# Patient Record
Sex: Male | Born: 1935 | Race: White | Hispanic: No | Marital: Married | Smoking: Former smoker
Health system: Southern US, Community
[De-identification: ages and names within clinical notes are randomized; demographics above are authoritative.]

## PROBLEM LIST (undated history)

## (undated) DIAGNOSIS — R7301 Impaired fasting glucose: Secondary | ICD-10-CM

## (undated) DIAGNOSIS — F329 Major depressive disorder, single episode, unspecified: Secondary | ICD-10-CM

## (undated) DIAGNOSIS — I4891 Unspecified atrial fibrillation: Secondary | ICD-10-CM

## (undated) DIAGNOSIS — K5790 Diverticulosis of intestine, part unspecified, without perforation or abscess without bleeding: Secondary | ICD-10-CM

## (undated) DIAGNOSIS — H353290 Exudative age-related macular degeneration, unspecified eye, stage unspecified: Secondary | ICD-10-CM

## (undated) DIAGNOSIS — G4733 Obstructive sleep apnea (adult) (pediatric): Secondary | ICD-10-CM

## (undated) DIAGNOSIS — E785 Hyperlipidemia, unspecified: Secondary | ICD-10-CM

## (undated) DIAGNOSIS — J984 Other disorders of lung: Secondary | ICD-10-CM

## (undated) DIAGNOSIS — G4731 Primary central sleep apnea: Secondary | ICD-10-CM

## (undated) DIAGNOSIS — C4359 Malignant melanoma of other part of trunk: Secondary | ICD-10-CM

## (undated) DIAGNOSIS — M545 Low back pain: Secondary | ICD-10-CM

## (undated) DIAGNOSIS — D638 Anemia in other chronic diseases classified elsewhere: Secondary | ICD-10-CM

## (undated) DIAGNOSIS — F17200 Nicotine dependence, unspecified, uncomplicated: Secondary | ICD-10-CM

## (undated) DIAGNOSIS — M5106 Intervertebral disc disorders with myelopathy, lumbar region: Secondary | ICD-10-CM

## (undated) DIAGNOSIS — I951 Orthostatic hypotension: Secondary | ICD-10-CM

## (undated) DIAGNOSIS — M12559 Traumatic arthropathy, unspecified hip: Secondary | ICD-10-CM

## (undated) DIAGNOSIS — M109 Gout, unspecified: Secondary | ICD-10-CM

## (undated) HISTORY — DX: Low back pain: M54.5

## (undated) HISTORY — DX: Intervertebral disc disorders with myelopathy, lumbar region: M51.06

## (undated) HISTORY — DX: Obstructive sleep apnea (adult) (pediatric): G47.33

## (undated) HISTORY — DX: Orthostatic hypotension: I95.1

## (undated) HISTORY — DX: Traumatic arthropathy, unspecified hip: M12.559

## (undated) HISTORY — PX: LUMBAR FUSION: SHX111

## (undated) HISTORY — DX: Other disorders of lung: J98.4

## (undated) HISTORY — DX: Gout, unspecified: M10.9

## (undated) HISTORY — DX: Impaired fasting glucose: R73.01

## (undated) HISTORY — DX: Malignant melanoma of other part of trunk: C43.59

## (undated) HISTORY — PX: CATARACT EXTRACTION: SUR2

## (undated) HISTORY — DX: Nicotine dependence, unspecified, uncomplicated: F17.200

## (undated) HISTORY — PX: MELANOMA EXCISION: SHX5266

## (undated) HISTORY — DX: Major depressive disorder, single episode, unspecified: F32.9

## (undated) HISTORY — DX: Anemia in other chronic diseases classified elsewhere: D63.8

## (undated) HISTORY — DX: Exudative age-related macular degeneration, unspecified eye, stage unspecified: H35.3290

## (undated) HISTORY — PX: LUMBAR LAMINECTOMY: SHX95

## (undated) HISTORY — DX: Primary central sleep apnea: G47.31

## (undated) HISTORY — DX: Hyperlipidemia, unspecified: E78.5

## (undated) HISTORY — DX: Unspecified atrial fibrillation: I48.91

## (undated) HISTORY — DX: Diverticulosis of intestine, part unspecified, without perforation or abscess without bleeding: K57.90

## (undated) HISTORY — PX: TONSILECTOMY, ADENOIDECTOMY, BILATERAL MYRINGOTOMY AND TUBES: SHX2538

---

## 1997-11-13 ENCOUNTER — Ambulatory Visit: Admission: RE | Admit: 1997-11-13 | Discharge: 1997-11-13 | Payer: Self-pay | Admitting: Otolaryngology

## 1998-06-19 ENCOUNTER — Ambulatory Visit: Admission: RE | Admit: 1998-06-19 | Discharge: 1998-06-19 | Payer: Self-pay | Admitting: Otolaryngology

## 1999-07-06 ENCOUNTER — Encounter: Payer: Self-pay | Admitting: Emergency Medicine

## 1999-07-06 ENCOUNTER — Inpatient Hospital Stay (HOSPITAL_COMMUNITY): Admission: EM | Admit: 1999-07-06 | Discharge: 1999-07-07 | Payer: Self-pay | Admitting: Emergency Medicine

## 1999-07-11 ENCOUNTER — Ambulatory Visit (HOSPITAL_COMMUNITY): Admission: RE | Admit: 1999-07-11 | Discharge: 1999-07-11 | Payer: Self-pay | Admitting: Internal Medicine

## 1999-07-11 ENCOUNTER — Encounter: Payer: Self-pay | Admitting: Internal Medicine

## 1999-08-22 ENCOUNTER — Ambulatory Visit (HOSPITAL_COMMUNITY): Admission: RE | Admit: 1999-08-22 | Discharge: 1999-08-22 | Payer: Self-pay | Admitting: Orthopaedic Surgery

## 1999-09-05 ENCOUNTER — Ambulatory Visit (HOSPITAL_COMMUNITY): Admission: RE | Admit: 1999-09-05 | Discharge: 1999-09-05 | Payer: Self-pay | Admitting: Orthopaedic Surgery

## 1999-09-21 ENCOUNTER — Ambulatory Visit (HOSPITAL_COMMUNITY): Admission: RE | Admit: 1999-09-21 | Discharge: 1999-09-21 | Payer: Self-pay | Admitting: Orthopaedic Surgery

## 1999-11-27 ENCOUNTER — Encounter: Admission: RE | Admit: 1999-11-27 | Discharge: 2000-02-25 | Payer: Self-pay | Admitting: Internal Medicine

## 2000-04-18 ENCOUNTER — Inpatient Hospital Stay (HOSPITAL_COMMUNITY): Admission: RE | Admit: 2000-04-18 | Discharge: 2000-04-19 | Payer: Self-pay | Admitting: Orthopaedic Surgery

## 2000-08-11 ENCOUNTER — Ambulatory Visit (HOSPITAL_BASED_OUTPATIENT_CLINIC_OR_DEPARTMENT_OTHER): Admission: RE | Admit: 2000-08-11 | Discharge: 2000-08-11 | Payer: Self-pay | Admitting: Internal Medicine

## 2003-09-17 ENCOUNTER — Emergency Department (HOSPITAL_COMMUNITY): Admission: EM | Admit: 2003-09-17 | Discharge: 2003-09-17 | Payer: Self-pay | Admitting: Emergency Medicine

## 2003-11-21 ENCOUNTER — Ambulatory Visit: Payer: Self-pay | Admitting: Internal Medicine

## 2003-12-30 ENCOUNTER — Ambulatory Visit: Payer: Self-pay | Admitting: Internal Medicine

## 2004-02-24 ENCOUNTER — Ambulatory Visit: Payer: Self-pay | Admitting: Gastroenterology

## 2004-02-29 ENCOUNTER — Ambulatory Visit: Payer: Self-pay | Admitting: Internal Medicine

## 2004-03-07 ENCOUNTER — Ambulatory Visit: Payer: Self-pay | Admitting: Gastroenterology

## 2004-03-13 ENCOUNTER — Ambulatory Visit: Payer: Self-pay | Admitting: Internal Medicine

## 2004-04-10 ENCOUNTER — Ambulatory Visit: Payer: Self-pay | Admitting: Internal Medicine

## 2004-04-28 ENCOUNTER — Emergency Department (HOSPITAL_COMMUNITY): Admission: EM | Admit: 2004-04-28 | Discharge: 2004-04-28 | Payer: Self-pay | Admitting: Emergency Medicine

## 2004-05-04 ENCOUNTER — Ambulatory Visit: Payer: Self-pay | Admitting: Internal Medicine

## 2004-06-05 ENCOUNTER — Ambulatory Visit: Payer: Self-pay | Admitting: Internal Medicine

## 2004-07-30 ENCOUNTER — Ambulatory Visit (HOSPITAL_BASED_OUTPATIENT_CLINIC_OR_DEPARTMENT_OTHER): Admission: RE | Admit: 2004-07-30 | Discharge: 2004-07-30 | Payer: Self-pay | Admitting: Surgery

## 2004-07-30 ENCOUNTER — Encounter (INDEPENDENT_AMBULATORY_CARE_PROVIDER_SITE_OTHER): Payer: Self-pay | Admitting: Specialist

## 2004-07-30 ENCOUNTER — Ambulatory Visit (HOSPITAL_COMMUNITY): Admission: RE | Admit: 2004-07-30 | Discharge: 2004-07-30 | Payer: Self-pay | Admitting: Surgery

## 2004-07-31 ENCOUNTER — Ambulatory Visit: Payer: Self-pay | Admitting: Internal Medicine

## 2004-10-01 ENCOUNTER — Ambulatory Visit: Payer: Self-pay | Admitting: Internal Medicine

## 2004-10-31 ENCOUNTER — Ambulatory Visit: Payer: Self-pay | Admitting: Internal Medicine

## 2004-11-14 ENCOUNTER — Ambulatory Visit: Payer: Self-pay | Admitting: Internal Medicine

## 2005-02-19 ENCOUNTER — Ambulatory Visit: Payer: Self-pay | Admitting: Internal Medicine

## 2005-03-11 ENCOUNTER — Ambulatory Visit: Payer: Self-pay | Admitting: Internal Medicine

## 2005-03-24 ENCOUNTER — Encounter: Admission: RE | Admit: 2005-03-24 | Discharge: 2005-03-24 | Payer: Self-pay | Admitting: Internal Medicine

## 2005-03-28 ENCOUNTER — Ambulatory Visit: Payer: Self-pay | Admitting: Internal Medicine

## 2005-07-23 ENCOUNTER — Ambulatory Visit: Payer: Self-pay | Admitting: Internal Medicine

## 2005-09-24 ENCOUNTER — Ambulatory Visit: Payer: Self-pay | Admitting: Internal Medicine

## 2005-09-29 ENCOUNTER — Encounter: Admission: RE | Admit: 2005-09-29 | Discharge: 2005-09-29 | Payer: Self-pay | Admitting: Neurosurgery

## 2005-10-07 ENCOUNTER — Ambulatory Visit: Payer: Self-pay | Admitting: Internal Medicine

## 2005-11-05 ENCOUNTER — Ambulatory Visit: Payer: Self-pay | Admitting: Internal Medicine

## 2005-12-18 ENCOUNTER — Encounter: Admission: RE | Admit: 2005-12-18 | Discharge: 2005-12-18 | Payer: Self-pay | Admitting: Neurosurgery

## 2005-12-22 ENCOUNTER — Encounter: Admission: RE | Admit: 2005-12-22 | Discharge: 2005-12-22 | Payer: Self-pay | Admitting: Neurosurgery

## 2006-01-14 DIAGNOSIS — D638 Anemia in other chronic diseases classified elsewhere: Secondary | ICD-10-CM

## 2006-01-14 HISTORY — DX: Anemia in other chronic diseases classified elsewhere: D63.8

## 2006-02-04 ENCOUNTER — Ambulatory Visit: Payer: Self-pay | Admitting: Internal Medicine

## 2006-02-18 ENCOUNTER — Ambulatory Visit: Payer: Self-pay | Admitting: Internal Medicine

## 2006-03-04 ENCOUNTER — Ambulatory Visit: Payer: Self-pay | Admitting: Internal Medicine

## 2006-03-27 ENCOUNTER — Ambulatory Visit: Payer: Self-pay | Admitting: Internal Medicine

## 2006-05-07 ENCOUNTER — Ambulatory Visit: Payer: Self-pay | Admitting: Internal Medicine

## 2006-07-14 ENCOUNTER — Ambulatory Visit: Payer: Self-pay | Admitting: Internal Medicine

## 2006-08-19 ENCOUNTER — Telehealth: Payer: Self-pay | Admitting: Internal Medicine

## 2006-08-19 DIAGNOSIS — M5106 Intervertebral disc disorders with myelopathy, lumbar region: Secondary | ICD-10-CM

## 2006-08-19 DIAGNOSIS — G4731 Primary central sleep apnea: Secondary | ICD-10-CM | POA: Insufficient documentation

## 2006-08-19 DIAGNOSIS — C4359 Malignant melanoma of other part of trunk: Secondary | ICD-10-CM

## 2006-08-19 DIAGNOSIS — G4733 Obstructive sleep apnea (adult) (pediatric): Secondary | ICD-10-CM

## 2006-08-19 DIAGNOSIS — D649 Anemia, unspecified: Secondary | ICD-10-CM

## 2006-08-19 DIAGNOSIS — G4739 Other sleep apnea: Secondary | ICD-10-CM

## 2006-08-19 HISTORY — DX: Malignant melanoma of other part of trunk: C43.59

## 2006-08-19 HISTORY — DX: Intervertebral disc disorders with myelopathy, lumbar region: M51.06

## 2006-08-19 HISTORY — DX: Other sleep apnea: G47.39

## 2006-08-19 HISTORY — DX: Obstructive sleep apnea (adult) (pediatric): G47.33

## 2006-08-19 HISTORY — DX: Primary central sleep apnea: G47.31

## 2006-08-27 ENCOUNTER — Ambulatory Visit: Payer: Self-pay | Admitting: Internal Medicine

## 2006-08-27 DIAGNOSIS — F329 Major depressive disorder, single episode, unspecified: Secondary | ICD-10-CM

## 2006-08-27 DIAGNOSIS — E785 Hyperlipidemia, unspecified: Secondary | ICD-10-CM

## 2006-08-27 DIAGNOSIS — F068 Other specified mental disorders due to known physiological condition: Secondary | ICD-10-CM

## 2006-08-27 DIAGNOSIS — E1169 Type 2 diabetes mellitus with other specified complication: Secondary | ICD-10-CM

## 2006-08-27 DIAGNOSIS — E1165 Type 2 diabetes mellitus with hyperglycemia: Secondary | ICD-10-CM

## 2006-08-27 DIAGNOSIS — F3289 Other specified depressive episodes: Secondary | ICD-10-CM

## 2006-08-27 HISTORY — DX: Other specified depressive episodes: F32.89

## 2006-08-27 HISTORY — DX: Major depressive disorder, single episode, unspecified: F32.9

## 2006-08-27 HISTORY — DX: Hyperlipidemia, unspecified: E78.5

## 2006-08-27 LAB — CONVERTED CEMR LAB
BUN: 31 mg/dL — ABNORMAL HIGH (ref 6–23)
CO2: 30 meq/L (ref 19–32)
CRP, High Sensitivity: 3 (ref 0.00–5.00)
Creatinine, Ser: 1.3 mg/dL (ref 0.4–1.5)
Hgb A1c MFr Bld: 5.5 % (ref 4.6–6.0)
Homocysteine: 15.2 micromoles/L — ABNORMAL HIGH (ref 5.00–13.90)
Microalb, Ur: 1.2 mg/dL (ref 0.0–1.9)
Potassium: 4.9 meq/L (ref 3.5–5.1)
Sodium: 142 meq/L (ref 135–145)

## 2006-09-04 ENCOUNTER — Telehealth: Payer: Self-pay | Admitting: Internal Medicine

## 2006-10-09 ENCOUNTER — Ambulatory Visit: Payer: Self-pay | Admitting: Internal Medicine

## 2006-10-09 DIAGNOSIS — IMO0002 Reserved for concepts with insufficient information to code with codable children: Secondary | ICD-10-CM

## 2006-10-09 LAB — CONVERTED CEMR LAB: LDL Goal: 100 mg/dL

## 2006-10-24 ENCOUNTER — Ambulatory Visit: Payer: Self-pay | Admitting: Internal Medicine

## 2007-01-27 ENCOUNTER — Ambulatory Visit: Payer: Self-pay | Admitting: Internal Medicine

## 2007-01-27 DIAGNOSIS — R7301 Impaired fasting glucose: Secondary | ICD-10-CM

## 2007-01-27 HISTORY — DX: Impaired fasting glucose: R73.01

## 2007-01-27 LAB — CONVERTED CEMR LAB
Direct LDL: 142.5 mg/dL
Hgb A1c MFr Bld: 5.7 % (ref 4.6–6.0)
Triglycerides: 107 mg/dL (ref 0–149)
VLDL: 21 mg/dL (ref 0–40)

## 2007-02-03 ENCOUNTER — Ambulatory Visit: Payer: Self-pay | Admitting: Internal Medicine

## 2007-02-03 DIAGNOSIS — M545 Low back pain, unspecified: Secondary | ICD-10-CM

## 2007-02-03 HISTORY — DX: Low back pain, unspecified: M54.50

## 2007-03-09 ENCOUNTER — Telehealth: Payer: Self-pay | Admitting: Internal Medicine

## 2007-05-26 ENCOUNTER — Telehealth: Payer: Self-pay | Admitting: Internal Medicine

## 2007-05-29 ENCOUNTER — Encounter: Payer: Self-pay | Admitting: Internal Medicine

## 2007-06-24 ENCOUNTER — Ambulatory Visit: Payer: Self-pay | Admitting: Internal Medicine

## 2007-06-24 LAB — CONVERTED CEMR LAB
Alcohol, Ethyl (B): 13 mg/dL — ABNORMAL HIGH (ref 0–10)
Basophils Absolute: 0 10*3/uL (ref 0.0–0.1)
Eosinophils Absolute: 0.1 10*3/uL (ref 0.0–0.7)
Hgb A1c MFr Bld: 5.7 % (ref 4.6–6.0)
Lymphocytes Relative: 22.7 % (ref 12.0–46.0)
MCHC: 35 g/dL (ref 30.0–36.0)
Neutrophils Relative %: 68.7 % (ref 43.0–77.0)
Platelets: 183 10*3/uL (ref 150–400)
RBC: 4.49 M/uL (ref 4.22–5.81)
RDW: 14.2 % (ref 11.5–14.6)

## 2007-06-26 ENCOUNTER — Encounter: Payer: Self-pay | Admitting: Internal Medicine

## 2007-07-01 ENCOUNTER — Telehealth: Payer: Self-pay | Admitting: Internal Medicine

## 2007-07-02 ENCOUNTER — Telehealth: Payer: Self-pay | Admitting: Internal Medicine

## 2007-07-27 ENCOUNTER — Ambulatory Visit: Payer: Self-pay | Admitting: Internal Medicine

## 2007-07-27 LAB — CONVERTED CEMR LAB

## 2007-07-29 ENCOUNTER — Telehealth: Payer: Self-pay | Admitting: Internal Medicine

## 2007-08-05 ENCOUNTER — Ambulatory Visit: Payer: Self-pay | Admitting: Internal Medicine

## 2007-08-05 LAB — CONVERTED CEMR LAB: Glucose, Fasting: 98 mg/dL (ref 70–99)

## 2007-08-06 ENCOUNTER — Telehealth: Payer: Self-pay | Admitting: *Deleted

## 2007-10-08 ENCOUNTER — Ambulatory Visit: Payer: Self-pay | Admitting: Internal Medicine

## 2007-11-05 ENCOUNTER — Encounter: Payer: Self-pay | Admitting: Internal Medicine

## 2007-12-31 ENCOUNTER — Ambulatory Visit: Payer: Self-pay | Admitting: Internal Medicine

## 2007-12-31 DIAGNOSIS — F17200 Nicotine dependence, unspecified, uncomplicated: Secondary | ICD-10-CM

## 2007-12-31 HISTORY — DX: Nicotine dependence, unspecified, uncomplicated: F17.200

## 2008-02-03 ENCOUNTER — Encounter: Payer: Self-pay | Admitting: Internal Medicine

## 2008-03-22 ENCOUNTER — Ambulatory Visit: Payer: Self-pay | Admitting: Internal Medicine

## 2008-03-22 DIAGNOSIS — R6883 Chills (without fever): Secondary | ICD-10-CM | POA: Insufficient documentation

## 2008-03-22 DIAGNOSIS — R634 Abnormal weight loss: Secondary | ICD-10-CM | POA: Insufficient documentation

## 2008-03-22 DIAGNOSIS — R1013 Epigastric pain: Secondary | ICD-10-CM | POA: Insufficient documentation

## 2008-03-22 LAB — CONVERTED CEMR LAB
ALT: 16 units/L (ref 0–53)
AST: 21 units/L (ref 0–37)
Albumin: 3.8 g/dL (ref 3.5–5.2)
Amylase: 85 units/L (ref 27–131)
BUN: 27 mg/dL — ABNORMAL HIGH (ref 6–23)
Basophils Absolute: 0 10*3/uL (ref 0.0–0.1)
Basophils Relative: 0.2 % (ref 0.0–3.0)
CO2: 28 meq/L (ref 19–32)
Calcium: 9.6 mg/dL (ref 8.4–10.5)
Chloride: 108 meq/L (ref 96–112)
Creatinine, Ser: 1.2 mg/dL (ref 0.4–1.5)
Eosinophils Absolute: 0.1 10*3/uL (ref 0.0–0.7)
Eosinophils Relative: 1.1 % (ref 0.0–5.0)
GFR calc non Af Amer: 62 mL/min
Hemoglobin: 13.7 g/dL (ref 13.0–17.0)
Hgb A1c MFr Bld: 5.6 % (ref 4.6–6.0)
MCHC: 33.2 g/dL (ref 30.0–36.0)
MCV: 93.7 fL (ref 78.0–100.0)
Neutro Abs: 7.6 10*3/uL (ref 1.4–7.7)
RBC: 4.39 M/uL (ref 4.22–5.81)
Total Bilirubin: 0.9 mg/dL (ref 0.3–1.2)
WBC: 11 10*3/uL — ABNORMAL HIGH (ref 4.5–10.5)

## 2008-03-24 ENCOUNTER — Ambulatory Visit: Payer: Self-pay | Admitting: Internal Medicine

## 2008-03-24 LAB — CONVERTED CEMR LAB
ALT: 15 units/L (ref 0–53)
AST: 23 units/L (ref 0–37)
Albumin: 3.9 g/dL (ref 3.5–5.2)
Alkaline Phosphatase: 58 units/L (ref 39–117)
BUN: 27 mg/dL — ABNORMAL HIGH (ref 6–23)
Bilirubin, Direct: 0.1 mg/dL (ref 0.0–0.3)
CO2: 32 meq/L (ref 19–32)
Glucose, Bld: 101 mg/dL — ABNORMAL HIGH (ref 70–99)
HDL: 51 mg/dL (ref >39.0)
Potassium: 4.7 meq/L (ref 3.5–5.1)
Sodium: 146 meq/L — ABNORMAL HIGH (ref 135–145)
Total Protein: 7 g/dL (ref 6.0–8.3)

## 2008-03-25 ENCOUNTER — Ambulatory Visit: Payer: Self-pay | Admitting: Cardiovascular Disease

## 2008-03-29 ENCOUNTER — Encounter: Payer: Self-pay | Admitting: Internal Medicine

## 2008-03-29 DIAGNOSIS — J984 Other disorders of lung: Secondary | ICD-10-CM

## 2008-03-29 HISTORY — DX: Other disorders of lung: J98.4

## 2008-03-31 ENCOUNTER — Ambulatory Visit: Payer: Self-pay | Admitting: Internal Medicine

## 2008-04-14 ENCOUNTER — Ambulatory Visit: Payer: Self-pay | Admitting: Internal Medicine

## 2008-04-25 ENCOUNTER — Encounter: Payer: Self-pay | Admitting: Internal Medicine

## 2008-05-13 ENCOUNTER — Telehealth: Payer: Self-pay | Admitting: Internal Medicine

## 2008-05-17 ENCOUNTER — Telehealth: Payer: Self-pay | Admitting: Internal Medicine

## 2008-05-23 ENCOUNTER — Ambulatory Visit: Payer: Self-pay | Admitting: Internal Medicine

## 2008-06-01 ENCOUNTER — Ambulatory Visit: Payer: Self-pay | Admitting: Internal Medicine

## 2008-06-01 DIAGNOSIS — R404 Transient alteration of awareness: Secondary | ICD-10-CM | POA: Insufficient documentation

## 2008-07-05 ENCOUNTER — Ambulatory Visit: Payer: Self-pay | Admitting: Internal Medicine

## 2008-07-05 LAB — CONVERTED CEMR LAB
BUN: 23 mg/dL (ref 6–23)
CO2: 30 meq/L (ref 19–32)
Calcium: 9.4 mg/dL (ref 8.4–10.5)
Creatinine, Ser: 1.3 mg/dL (ref 0.4–1.5)
GFR calc non Af Amer: 56.34 mL/min (ref >60)
Glucose, Bld: 105 mg/dL — ABNORMAL HIGH (ref 70–99)

## 2008-09-15 ENCOUNTER — Telehealth: Payer: Self-pay | Admitting: Internal Medicine

## 2008-09-30 ENCOUNTER — Telehealth: Payer: Self-pay | Admitting: Internal Medicine

## 2008-11-11 ENCOUNTER — Telehealth: Payer: Self-pay | Admitting: Internal Medicine

## 2008-11-14 ENCOUNTER — Telehealth (INDEPENDENT_AMBULATORY_CARE_PROVIDER_SITE_OTHER): Payer: Self-pay | Admitting: *Deleted

## 2008-11-28 ENCOUNTER — Ambulatory Visit: Payer: Self-pay | Admitting: Internal Medicine

## 2008-12-21 ENCOUNTER — Ambulatory Visit: Payer: Self-pay | Admitting: Internal Medicine

## 2008-12-21 LAB — CONVERTED CEMR LAB
BUN: 20 mg/dL (ref 6–23)
Creatinine, Ser: 1.4 mg/dL (ref 0.4–1.5)
GFR calc non Af Amer: 51.66 mL/min (ref >60)
Glucose, Bld: 108 mg/dL — ABNORMAL HIGH (ref 70–99)
Potassium: 4.8 meq/L (ref 3.5–5.1)

## 2008-12-30 ENCOUNTER — Telehealth: Payer: Self-pay | Admitting: Internal Medicine

## 2009-01-05 ENCOUNTER — Encounter: Payer: Self-pay | Admitting: Internal Medicine

## 2009-01-12 ENCOUNTER — Telehealth: Payer: Self-pay | Admitting: Internal Medicine

## 2009-02-21 ENCOUNTER — Ambulatory Visit: Payer: Self-pay | Admitting: Internal Medicine

## 2009-03-06 ENCOUNTER — Encounter: Payer: Self-pay | Admitting: Internal Medicine

## 2009-03-06 ENCOUNTER — Telehealth: Payer: Self-pay | Admitting: Internal Medicine

## 2009-03-07 ENCOUNTER — Telehealth: Payer: Self-pay | Admitting: Internal Medicine

## 2009-03-08 ENCOUNTER — Ambulatory Visit: Payer: Self-pay | Admitting: Internal Medicine

## 2009-03-08 DIAGNOSIS — R110 Nausea: Secondary | ICD-10-CM | POA: Insufficient documentation

## 2009-03-22 ENCOUNTER — Ambulatory Visit: Payer: Self-pay | Admitting: Internal Medicine

## 2009-03-22 ENCOUNTER — Telehealth: Payer: Self-pay | Admitting: Internal Medicine

## 2009-03-22 DIAGNOSIS — M109 Gout, unspecified: Secondary | ICD-10-CM

## 2009-03-22 HISTORY — DX: Gout, unspecified: M10.9

## 2009-03-31 ENCOUNTER — Telehealth: Payer: Self-pay | Admitting: Internal Medicine

## 2009-04-06 ENCOUNTER — Telehealth: Payer: Self-pay | Admitting: Internal Medicine

## 2009-04-11 ENCOUNTER — Telehealth: Payer: Self-pay | Admitting: Internal Medicine

## 2009-04-12 ENCOUNTER — Ambulatory Visit (HOSPITAL_COMMUNITY): Admission: RE | Admit: 2009-04-12 | Discharge: 2009-04-12 | Payer: Self-pay | Admitting: Neurological Surgery

## 2009-04-19 ENCOUNTER — Encounter: Payer: Self-pay | Admitting: Internal Medicine

## 2009-04-20 ENCOUNTER — Telehealth: Payer: Self-pay | Admitting: Internal Medicine

## 2009-04-28 ENCOUNTER — Ambulatory Visit: Payer: Self-pay | Admitting: Internal Medicine

## 2009-05-16 ENCOUNTER — Telehealth: Payer: Self-pay | Admitting: Internal Medicine

## 2009-05-26 ENCOUNTER — Ambulatory Visit: Payer: Self-pay | Admitting: Internal Medicine

## 2009-05-26 DIAGNOSIS — H6120 Impacted cerumen, unspecified ear: Secondary | ICD-10-CM

## 2009-05-26 LAB — CONVERTED CEMR LAB
BUN: 27 mg/dL — ABNORMAL HIGH (ref 6–23)
Creatinine, Ser: 1.3 mg/dL (ref 0.4–1.5)
GFR calc non Af Amer: 55.72 mL/min (ref >60)
Glucose, Bld: 91 mg/dL (ref 70–99)
Potassium: 4.8 meq/L (ref 3.5–5.1)

## 2009-06-28 ENCOUNTER — Encounter: Payer: Self-pay | Admitting: Internal Medicine

## 2009-06-29 ENCOUNTER — Telehealth: Payer: Self-pay | Admitting: Internal Medicine

## 2009-06-29 DIAGNOSIS — M12559 Traumatic arthropathy, unspecified hip: Secondary | ICD-10-CM

## 2009-06-29 HISTORY — DX: Traumatic arthropathy, unspecified hip: M12.559

## 2009-07-03 ENCOUNTER — Telehealth: Payer: Self-pay | Admitting: Internal Medicine

## 2009-07-05 ENCOUNTER — Inpatient Hospital Stay (HOSPITAL_COMMUNITY): Admission: EM | Admit: 2009-07-05 | Discharge: 2009-07-10 | Payer: Self-pay | Admitting: Emergency Medicine

## 2009-07-05 ENCOUNTER — Other Ambulatory Visit: Payer: Self-pay | Admitting: Orthopedic Surgery

## 2009-07-06 ENCOUNTER — Encounter (INDEPENDENT_AMBULATORY_CARE_PROVIDER_SITE_OTHER): Payer: Self-pay | Admitting: Orthopedic Surgery

## 2009-07-07 ENCOUNTER — Telehealth: Payer: Self-pay | Admitting: Family Medicine

## 2009-07-12 ENCOUNTER — Observation Stay (HOSPITAL_COMMUNITY): Admission: EM | Admit: 2009-07-12 | Discharge: 2009-07-13 | Payer: Self-pay | Admitting: Emergency Medicine

## 2009-07-24 ENCOUNTER — Inpatient Hospital Stay (HOSPITAL_COMMUNITY): Admission: EM | Admit: 2009-07-24 | Discharge: 2009-07-29 | Payer: Self-pay | Admitting: Emergency Medicine

## 2009-08-08 ENCOUNTER — Encounter: Admission: RE | Admit: 2009-08-08 | Discharge: 2009-08-08 | Payer: Self-pay | Admitting: Endocrinology

## 2009-09-01 ENCOUNTER — Telehealth: Payer: Self-pay | Admitting: Internal Medicine

## 2009-09-05 ENCOUNTER — Ambulatory Visit: Payer: Self-pay | Admitting: Internal Medicine

## 2009-09-05 DIAGNOSIS — D508 Other iron deficiency anemias: Secondary | ICD-10-CM | POA: Insufficient documentation

## 2009-09-05 LAB — CONVERTED CEMR LAB
BUN: 22 mg/dL (ref 6–23)
Basophils Relative: 0.1 % (ref 0.0–3.0)
Chloride: 99 meq/L (ref 96–112)
Chloride: 99 meq/L (ref 96–112)
Creatinine, Ser: 1.2 mg/dL (ref 0.4–1.5)
Eosinophils Absolute: 0.1 10*3/uL (ref 0.0–0.7)
Eosinophils Relative: 0.6 % (ref 0.0–5.0)
GFR calc non Af Amer: 59.88 mL/min (ref >60)
GFR calc non Af Amer: 63.44 mL/min (ref >60)
Glucose, Bld: 99 mg/dL (ref 70–99)
Hemoglobin: 9.8 g/dL — ABNORMAL LOW (ref 13.0–17.0)
Iron: 47 ug/dL (ref 42–165)
Lymphocytes Relative: 19.2 % (ref 12.0–46.0)
MCHC: 33 g/dL (ref 30.0–36.0)
Monocytes Relative: 4.9 % (ref 3.0–12.0)
Neutro Abs: 9.4 10*3/uL — ABNORMAL HIGH (ref 1.4–7.7)
Potassium: 5 meq/L (ref 3.5–5.1)
Potassium: 5 meq/L (ref 3.5–5.1)
Saturation Ratios: 18 % — ABNORMAL LOW (ref 20.0–50.0)
Sodium: 143 meq/L (ref 135–145)
Transferrin: 186.6 mg/dL — ABNORMAL LOW (ref 212.0–360.0)

## 2009-09-13 ENCOUNTER — Telehealth: Payer: Self-pay | Admitting: Internal Medicine

## 2009-09-17 ENCOUNTER — Ambulatory Visit: Payer: Self-pay | Admitting: Internal Medicine

## 2009-09-17 ENCOUNTER — Inpatient Hospital Stay (HOSPITAL_COMMUNITY): Admission: EM | Admit: 2009-09-17 | Discharge: 2009-09-19 | Payer: Self-pay | Admitting: Internal Medicine

## 2009-09-19 ENCOUNTER — Encounter (INDEPENDENT_AMBULATORY_CARE_PROVIDER_SITE_OTHER): Payer: Self-pay | Admitting: Internal Medicine

## 2009-09-19 ENCOUNTER — Ambulatory Visit: Payer: Self-pay | Admitting: Vascular Surgery

## 2009-09-20 ENCOUNTER — Telehealth: Payer: Self-pay | Admitting: Internal Medicine

## 2009-09-25 ENCOUNTER — Telehealth: Payer: Self-pay | Admitting: Internal Medicine

## 2009-09-29 ENCOUNTER — Ambulatory Visit: Payer: Self-pay | Admitting: Internal Medicine

## 2009-09-29 LAB — CONVERTED CEMR LAB
Folate: >20 ng/mL
Hgb A1c MFr Bld: 5.1 % (ref <5.7)
Vitamin B-12: 476 pg/mL (ref 211–911)

## 2009-10-19 ENCOUNTER — Encounter: Payer: Self-pay | Admitting: Internal Medicine

## 2009-10-20 ENCOUNTER — Emergency Department (HOSPITAL_COMMUNITY): Admission: EM | Admit: 2009-10-20 | Discharge: 2009-10-20 | Payer: Self-pay | Admitting: Family Medicine

## 2009-10-20 ENCOUNTER — Emergency Department (HOSPITAL_COMMUNITY): Admission: EM | Admit: 2009-10-20 | Discharge: 2009-10-21 | Payer: Self-pay | Admitting: Emergency Medicine

## 2009-10-23 ENCOUNTER — Telehealth: Payer: Self-pay | Admitting: Internal Medicine

## 2009-10-31 ENCOUNTER — Ambulatory Visit: Payer: Self-pay | Admitting: Internal Medicine

## 2009-10-31 DIAGNOSIS — I951 Orthostatic hypotension: Secondary | ICD-10-CM

## 2009-10-31 HISTORY — DX: Orthostatic hypotension: I95.1

## 2009-10-31 LAB — CONVERTED CEMR LAB
Basophils Absolute: 0 10*3/uL (ref 0.0–0.1)
Basophils Relative: 0.6 % (ref 0.0–3.0)
Calcium: 9.9 mg/dL (ref 8.4–10.5)
Eosinophils Absolute: 0.2 10*3/uL (ref 0.0–0.7)
GFR calc non Af Amer: 57.69 mL/min (ref >60)
Hemoglobin: 10.8 g/dL — ABNORMAL LOW (ref 13.0–17.0)
Hgb A1c MFr Bld: 4.9 % (ref 4.6–6.5)
Lymphocytes Relative: 23.8 % (ref 12.0–46.0)
MCHC: 33.5 g/dL (ref 30.0–36.0)
Monocytes Relative: 9.9 % (ref 3.0–12.0)
Neutro Abs: 5.1 10*3/uL (ref 1.4–7.7)
Neutrophils Relative %: 63.2 % (ref 43.0–77.0)
Potassium: 4.2 meq/L (ref 3.5–5.1)
RBC: 3.59 M/uL — ABNORMAL LOW (ref 4.22–5.81)
RDW: 16.6 % — ABNORMAL HIGH (ref 11.5–14.6)
Sodium: 139 meq/L (ref 135–145)

## 2009-11-14 ENCOUNTER — Telehealth: Payer: Self-pay | Admitting: Internal Medicine

## 2009-11-27 ENCOUNTER — Ambulatory Visit: Payer: Self-pay | Admitting: Internal Medicine

## 2009-11-27 DIAGNOSIS — I4891 Unspecified atrial fibrillation: Secondary | ICD-10-CM

## 2009-11-27 HISTORY — DX: Unspecified atrial fibrillation: I48.91

## 2009-11-28 ENCOUNTER — Telehealth (INDEPENDENT_AMBULATORY_CARE_PROVIDER_SITE_OTHER): Payer: Self-pay | Admitting: *Deleted

## 2009-11-30 ENCOUNTER — Encounter: Payer: Self-pay | Admitting: Internal Medicine

## 2009-11-30 ENCOUNTER — Ambulatory Visit: Payer: Self-pay

## 2009-12-13 ENCOUNTER — Ambulatory Visit: Payer: Self-pay | Admitting: Internal Medicine

## 2009-12-20 ENCOUNTER — Emergency Department (HOSPITAL_COMMUNITY)
Admission: EM | Admit: 2009-12-20 | Discharge: 2009-12-20 | Payer: Self-pay | Source: Home / Self Care | Admitting: Emergency Medicine

## 2009-12-22 ENCOUNTER — Encounter
Admission: RE | Admit: 2009-12-22 | Discharge: 2009-12-22 | Payer: Self-pay | Source: Home / Self Care | Attending: Orthopaedic Surgery | Admitting: Orthopaedic Surgery

## 2010-02-13 NOTE — Progress Notes (Signed)
Summary: pt due for f/u cxr  ---- Converted from flag ---- ---- 11/28/2008 11:13 AM, Ricardo Cowden MD wrote: needs f/u cxr ------------------------------  Spoke with pt and advised times for f/u cxr.  Pt states that he is not able to make appt at this time an dwill call back, he has transportation issues.  Just an FYI.

## 2010-02-13 NOTE — Assessment & Plan Note (Signed)
Summary: 2 MONTH ROV/NJR///pt rescd//ccm   Vital Signs:  Patient profile:   77 year old male Height:      73 inches Weight:      169 pounds BMI:     22.38 Temp:     98.2 degrees F oral Pulse rate:   84 / minute Resp:     14 per minute BP sitting:   144 / 70  (left arm)  Vitals Entered By: Willy Eddy, LPN (April 28, 2009 12:15 PM) CC: roa- avinza works better- only 2 ti mes of nausea   Primary Care Provider:  Dr. Darryll Capers  CC:  roa- avinza works better- only 2 ti mes of nausea.  History of Present Illness: follow up back pain and the change to the avinza with marked improvement both in pain control and in side eefect He is going to [facility] next week for shots to helps diagnos the etiology of the back pain he is less depressed and hopes to resume daytime driving soon he is even abel to walk short distances without pain!  Preventive Screening-Counseling & Management  Alcohol-Tobacco     Alcohol drinks/day: <1     Smoking Status: quit     Smoking Cessation Counseling: yes     Packs/Day: 1-2 packs per day     Year Started: 1958     Year Quit: september 2010     Passive Smoke Exposure: no  Problems Prior to Update: 1)  Gout  (ICD-274.9) 2)  Nausea  (ICD-787.02) 3)  Sleepiness  (ICD-780.09) 4)  Lung Nodule  (ICD-518.89) 5)  Abdominal Pain, Epigastric  (ICD-789.06) 6)  Weight Loss, Abnormal  (ICD-783.21) 7)  Chills Without Fever  (ICD-780.64) 8)  Tobacco Abuse  (ICD-305.1) 9)  Observation For Suspected Malignant Neoplasm  (ICD-V71.1) 10)  Accidental Falls, Recurrent  (ICD-E888.9) 11)  Low Back Pain  (ICD-724.2) 12)  Impaired Fasting Glucose  (ICD-790.21) 13)  Lumbar Radiculopathy, Left  (ICD-724.4) 14)  Hyperlipidemia  (ICD-272.4) 15)  Dm W/manifestation Nec, Type II, Uncontrolled  (ICD-250.82) 16)  Depression  (ICD-311) 17)  Dementia  (ICD-294.8) 18)  Obstructive Sleep Apnea  (ICD-327.23) 19)  Melanoma, Malignant, Trunk  (ICD-172.5) 20)  Anemia Nos   (ICD-285.9) 21)  Disorder, Lumbar Disc W/myelopathy  (ICD-722.73)  Current Problems (verified): 1)  Gout  (ICD-274.9) 2)  Nausea  (ICD-787.02) 3)  Sleepiness  (ICD-780.09) 4)  Lung Nodule  (ICD-518.89) 5)  Abdominal Pain, Epigastric  (ICD-789.06) 6)  Weight Loss, Abnormal  (ICD-783.21) 7)  Chills Without Fever  (ICD-780.64) 8)  Tobacco Abuse  (ICD-305.1) 9)  Observation For Suspected Malignant Neoplasm  (ICD-V71.1) 10)  Accidental Falls, Recurrent  (ICD-E888.9) 11)  Low Back Pain  (ICD-724.2) 12)  Impaired Fasting Glucose  (ICD-790.21) 13)  Lumbar Radiculopathy, Left  (ICD-724.4) 14)  Hyperlipidemia  (ICD-272.4) 15)  Dm W/manifestation Nec, Type II, Uncontrolled  (ICD-250.82) 16)  Depression  (ICD-311) 17)  Dementia  (ICD-294.8) 18)  Obstructive Sleep Apnea  (ICD-327.23) 19)  Melanoma, Malignant, Trunk  (ICD-172.5) 20)  Anemia Nos  (ICD-285.9) 21)  Disorder, Lumbar Disc W/myelopathy  (ICD-722.73)  Medications Prior to Update: 1)  Valium 5 Mg  Tabs (Diazepam) .... One By Mouth Q Hs 2)  Lexapro 10 Mg  Tabs (Escitalopram Oxalate) .Marland Kitchen.. 1 Once Daily 3)  Lyrica 75 Mg  Caps (Pregabalin) .Marland Kitchen.. 1 Once Daily 4)  Celebrex 200 Mg  Caps (Celecoxib) .... One By Mouth Two Times A Day 5)  Calcium 500/d 500-125 Mg-Unit  Tabs (  Calcium Carbonate-Vitamin D) .... Once Daily 6)  Multivitamins   Tabs (Multiple Vitamin) .... Once Daily 7)  Lorazepam 0.5 Mg Tabs (Lorazepam) .... One By Mouth Bid 8)  Fish Oil 1000 Mg Caps (Omega-3 Fatty Acids) .Marland Kitchen.. 1 Once Daily 9)  Avinza 60 Mg Xr24h-Cap (Morphine Sulfate Beads) .Marland Kitchen.. 1 Once Daily  Current Medications (verified): 1)  Valium 5 Mg  Tabs (Diazepam) .... One By Mouth Q Hs 2)  Lexapro 10 Mg  Tabs (Escitalopram Oxalate) .Marland Kitchen.. 1 Once Daily 3)  Lyrica 75 Mg  Caps (Pregabalin) .Marland Kitchen.. 1 Once Daily 4)  Celebrex 200 Mg  Caps (Celecoxib) .... One By Mouth Two Times A Day 5)  Calcium 500/d 500-125 Mg-Unit  Tabs (Calcium Carbonate-Vitamin D) .... Once Daily 6)   Multivitamins   Tabs (Multiple Vitamin) .... Once Daily 7)  Lorazepam 0.5 Mg Tabs (Lorazepam) .... One By Mouth Bid 8)  Fish Oil 1000 Mg Caps (Omega-3 Fatty Acids) .Marland Kitchen.. 1 Once Daily 9)  Avinza 60 Mg Xr24h-Cap (Morphine Sulfate Beads) .Marland Kitchen.. 1 Once Daily  Allergies (verified): 1)  ! Sulfa 2)  * Dilaudid 3)  Fentanyl (Fentanyl)  Past History:  Family History: Last updated: 05/07/2008 Father died of cancer- testicular Family History of Prostate CA 1st degree relative <50 mother died of pancreatic cancer Family History of Arthritis  Social History: Last updated: 05/23/2008 Retired Married Pos smoking hx since age 72. had smoked up to 2 ppd. > quit 04/2008 Alcohol use-yes Drug use-no Regular exercise-no  Risk Factors: Alcohol Use: <1 (04/28/2009) Exercise: no (08/27/2006)  Risk Factors: Smoking Status: quit (04/28/2009) Packs/Day: 1-2 packs per day (04/28/2009) Passive Smoke Exposure: no (04/28/2009)  Past medical, surgical, family and social histories (including risk factors) reviewed, and no changes noted (except as noted below).  Past Medical History: Reviewed history from 05/23/2008 and no changes required. sleep apnea Dementia Depression Diabetes mellitus, type II Hyperlipidemia Low back pain ASBESTOSIS     -  PFT's 05/23/08 FEV1 2.61 ( 90%) ratio 77   no improvment after B2, nl DLC0  Past Surgical History: Reviewed history from May 07, 2008 and no changes required. Cataract extraction Lumbar laminectomy Lumbar fusion foot fractures Tonsillectomy Melanoma lower left back "precancer"..............................................Marland KitchenGrubar  Family History: Reviewed history from May 07, 2008 and no changes required. Father died of cancer- testicular Family History of Prostate CA 1st degree relative <50 mother died of pancreatic cancer Family History of Arthritis  Social History: Reviewed history from 05/23/2008 and no changes required. Retired Married Pos  smoking hx since age 41. had smoked up to 2 ppd. > quit 04/2008 Alcohol use-yes Drug use-no Regular exercise-no  Review of Systems  The patient denies anorexia, fever, weight loss, weight gain, vision loss, decreased hearing, hoarseness, chest pain, syncope, dyspnea on exertion, peripheral edema, prolonged cough, headaches, hemoptysis, abdominal pain, melena, hematochezia, severe indigestion/heartburn, hematuria, incontinence, genital sores, muscle weakness, suspicious skin lesions, transient blindness, difficulty walking, depression, unusual weight change, abnormal bleeding, enlarged lymph nodes, angioedema, and breast masses.    Physical Exam  General:  alert, cachetic, and pale.   Eyes:  pupils equal and pupils reactive to light.   Ears:  R ear normal and L ear normal.   Mouth:  pharynx pink and moist and no erythema.   Neck:  No deformities, masses, or tenderness noted. Lungs:  increased bronchial bresth sounds, no rales slight end exp wheezing Heart:  normal rate and regular rhythm.   Abdomen:  soft and non-tender.     Impression & Recommendations:  Problem #  1:  LUMBAR RADICULOPATHY, LEFT (ICD-724.4) the avinza is working well mild nausea in the AM that ends after eating His updated medication list for this problem includes:    Celebrex 200 Mg Caps (Celecoxib) ..... One by mouth two times a day    Avinza 60 Mg Xr24h-cap (Morphine sulfate beads) .Marland Kitchen... 1 once daily  Discussed use of moist heat or ice, modified activities, medications, and stretching/strengthening exercises. Back care instructions given. To be seen in 2 weeks if no improvement; sooner if worsening of symptoms.  Dr Danielle Dess is planning several epidurals  Problem # 2:  LOW BACK PAIN (ICD-724.2)  His updated medication list for this problem includes:    Celebrex 200 Mg Caps (Celecoxib) ..... One by mouth two times a day    Avinza 60 Mg Xr24h-cap (Morphine sulfate beads) .Marland Kitchen... 1 once daily  Discussed use of moist  heat or ice, modified activities, medications, and stretching/strengthening exercises. Back care instructions given. To be seen in 2 weeks if no improvement; sooner if worsening of symptoms.   Problem # 3:  DM W/MANIFESTATION NEC, TYPE II, UNCONTROLLED (ICD-250.82) monoteing the cbgs are in control Labs Reviewed: Creat: 1.4 (12/21/2008)    Reviewed HgBA1c results: 5.7 (12/21/2008)  5.2 (07/05/2008)  Complete Medication List: 1)  Valium 5 Mg Tabs (Diazepam) .... One by mouth q hs 2)  Lexapro 10 Mg Tabs (Escitalopram oxalate) .Marland Kitchen.. 1 once daily 3)  Lyrica 75 Mg Caps (Pregabalin) .Marland Kitchen.. 1 once daily 4)  Celebrex 200 Mg Caps (Celecoxib) .... One by mouth two times a day 5)  Calcium 500/d 500-125 Mg-unit Tabs (Calcium carbonate-vitamin d) .... Once daily 6)  Multivitamins Tabs (Multiple vitamin) .... Once daily 7)  Lorazepam 0.5 Mg Tabs (Lorazepam) .... One by mouth bid 8)  Fish Oil 1000 Mg Caps (Omega-3 fatty acids) .Marland Kitchen.. 1 once daily 9)  Avinza 60 Mg Xr24h-cap (Morphine sulfate beads) .Marland Kitchen.. 1 once daily  Patient Instructions: 1)  Please schedule a follow-up appointment in 1 month. Prescriptions: AVINZA 60 MG XR24H-CAP (MORPHINE SULFATE BEADS) 1 once daily  #30 x 0   Entered and Authorized by:   Stacie Glaze MD   Signed by:   Stacie Glaze MD on 04/28/2009   Method used:   Print then Give to Patient   RxID:   [id number]

## 2010-02-13 NOTE — Progress Notes (Signed)
Summary: lexapro?  Phone Note Call from Patient Call back at [phone] [facility]   Summary of Call: For 2 days before & 2 days after myelogram to discontinue the Lexapro.  Is it okay if he continues the Lexapro?  Will it increase the probability that Ricardo Allen will have seizuure during myelogram.  What's at stake is the amount of Xanax/valium that's in the Lexapro per neurologist.   Initial call taken by: Rudy Jew, RN,  April 06, 2009 10:54 AM  Follow-up for Phone Call        per dr Lovell Sheehan- he may contniue the lexapro- Follow-up by: Willy Eddy, LPN,  April 06, 2009 12:12 PM  Additional Follow-up for Phone Call Additional follow up Details #1::        Phone Call Completed Additional Follow-up by: Rudy Jew, RN,  April 06, 2009 12:15 PM

## 2010-02-13 NOTE — Progress Notes (Signed)
  Phone Note Call from Patient   Summary of Call:  refill on avinza Initial call taken by: Willy Eddy, LPN,  April 20, 2009 10:44 AM    Prescriptions: AVINZA 60 MG XR24H-CAP (MORPHINE SULFATE BEADS) 1 once daily  #30 x 0   Entered by:   Willy Eddy, LPN   Authorized by:   Evelena Peat MD   Signed by:   Willy Eddy, LPN on 37/62/8315   Method used:   Print then Give to Patient   RxID:   [id number]  pt informed ready for pick up

## 2010-02-13 NOTE — Progress Notes (Signed)
Summary: right eye swollen/drainage  Phone Note Call from Patient Call back at Home Phone [phone]   Caller: Patient Call For: Stacie Glaze MD Summary of Call: pt right eye is swollen/drainage pharmacy [facility] [phone] Initial call taken by: Heron Sabins,  March 31, 2009 8:37 AM  Follow-up for Phone Call        per dr Baylon Santelli-corticosporin eye drop 4 to affected eye qid pt informed- and med called to [facility] Follow-up by: Willy Eddy, LPN,  March 31, 2009 8:40 AM

## 2010-02-13 NOTE — Progress Notes (Signed)
Summary: increase morphine  Phone Note Call from Patient   Caller: Patient Call For: Stacie Glaze MD Summary of Call: Pt is calling to ask Dr. Lovell Sheehan to increase the strength of his Morphine.......Marland KitchenHe is in severe pain in his back.  [phone] Initial call taken by: Lynann Beaver CMA,  June 29, 2009 9:11 AM  Follow-up for Phone Call        per dr Lovell Sheehan he wants him to try to take lyrica twice a day first- see if that will help Follow-up by: Willy Eddy, LPN,  June 29, 2009 9:27 AM  Additional Follow-up for Phone Call Additional follow up Details #1::        Pt notified. Additional Follow-up by: Lynann Beaver CMA,  June 29, 2009 9:34 AM    New/Updated Medications: LYRICA 75 MG  CAPS (PREGABALIN) 1 by mouth two times a day

## 2010-02-13 NOTE — Assessment & Plan Note (Signed)
Summary: wants to stop fentanyl patches/bmw   Vital Signs:  Patient profile:   77 year old male Height:      73 inches Weight:      166 pounds BMI:     21.98 Temp:     98.2 degrees F oral Pulse rate:   76 / minute Resp:     14 per minute BP sitting:   110 / 60  (left arm)  Vitals Entered By: Willy Eddy, LPN (March 08, 2009 2:46 PM) CC: wants to stop fentanyl- CAUSES NAUSEA WHICH IS CAUSING LOSS OF APPETIETE-unsteady on feet sleeping alot and wife states heis body "twitches' when sleeping when he took patche off    Primary Care Provider:  Dr. Darryll Capers  CC:  wants to stop fentanyl- CAUSES NAUSEA WHICH IS CAUSING LOSS OF APPETIETE-unsteady on feet sleeping alot and wife states heis body "twitches' when sleeping when he took patche off .  History of Present Illness: he attributes the nausea to the fentanyl patches ( diluadid) pt is on 75 as of yesterday the nausea decreased but also has some reactive depression wife present and we discussed the condisiton of the relations ship and the effect of redefining roles due to illness increased abdominal  pain  Preventive Screening-Counseling & Management  Alcohol-Tobacco     Alcohol drinks/day: <1     Smoking Status: quit     Smoking Cessation Counseling: yes     Packs/Day: 1-2 packs per day     Year Started: 1958     Year Quit: september 2010     Passive Smoke Exposure: no  Problems Prior to Update: 1)  Sleepiness  (ICD-780.09) 2)  Lung Nodule  (ICD-518.89) 3)  Abdominal Pain, Epigastric  (ICD-789.06) 4)  Weight Loss, Abnormal  (ICD-783.21) 5)  Chills Without Fever  (ICD-780.64) 6)  Tobacco Abuse  (ICD-305.1) 7)  Observation For Suspected Malignant Neoplasm  (ICD-V71.1) 8)  Accidental Falls, Recurrent  (ICD-E888.9) 9)  Low Back Pain  (ICD-724.2) 10)  Impaired Fasting Glucose  (ICD-790.21) 11)  Lumbar Radiculopathy, Left  (ICD-724.4) 12)  Hyperlipidemia  (ICD-272.4) 13)  Dm W/manifestation Nec, Type II,  Uncontrolled  (ICD-250.82) 14)  Depression  (ICD-311) 15)  Dementia  (ICD-294.8) 16)  Obstructive Sleep Apnea  (ICD-327.23) 17)  Melanoma, Malignant, Trunk  (ICD-172.5) 18)  Anemia Nos  (ICD-285.9) 19)  Disorder, Lumbar Disc W/myelopathy  (ICD-722.73)  Medications Prior to Update: 1)  Valium 5 Mg  Tabs (Diazepam) .... One By Mouth Q Hs 2)  Lexapro 10 Mg  Tabs (Escitalopram Oxalate) .Marland Kitchen.. 1 Once Daily 3)  Lyrica 75 Mg  Caps (Pregabalin) .Marland Kitchen.. 1 Once Daily 4)  Celebrex 200 Mg  Caps (Celecoxib) .... One By Mouth Two Times A Day 5)  Calcium 500/d 500-125 Mg-Unit  Tabs (Calcium Carbonate-Vitamin D) .... Once Daily 6)  Multivitamins   Tabs (Multiple Vitamin) .... Once Daily 7)  Lorazepam 0.5 Mg Tabs (Lorazepam) .... One By Mouth Bid 8)  Fish Oil 1000 Mg Caps (Omega-3 Fatty Acids) .Marland Kitchen.. 1 Once Daily 9)  Fentanyl 75 Mcg/hr Pt72 (Fentanyl) .... Change Every 72 Hours  Current Medications (verified): 1)  Valium 5 Mg  Tabs (Diazepam) .... One By Mouth Q Hs 2)  Lexapro 10 Mg  Tabs (Escitalopram Oxalate) .Marland Kitchen.. 1 Once Daily 3)  Lyrica 75 Mg  Caps (Pregabalin) .Marland Kitchen.. 1 Once Daily 4)  Celebrex 200 Mg  Caps (Celecoxib) .... One By Mouth Two Times A Day 5)  Calcium 500/d 500-125 Mg-Unit  Tabs (Calcium Carbonate-Vitamin D) .... Once Daily 6)  Multivitamins   Tabs (Multiple Vitamin) .... Once Daily 7)  Lorazepam 0.5 Mg Tabs (Lorazepam) .... One By Mouth Bid 8)  Fish Oil 1000 Mg Caps (Omega-3 Fatty Acids) .Marland Kitchen.. 1 Once Daily 9)  Morphine Sulfate Cr 30 Mg Xr12h-Tab (Morphine Sulfate) .... One By Mouth Two Times A Day  Allergies: 1)  ! Sulfa 2)  * Dilaudid 3)  Fentanyl (Fentanyl)  Past History:  Family History: Last updated: 04-15-08 Father died of cancer- testicular Family History of Prostate CA 1st degree relative <50 mother died of pancreatic cancer Family History of Arthritis  Social History: Last updated: 05/23/2008 Retired Married Pos smoking hx since age 24. had smoked up to 2 ppd. > quit  04/2008 Alcohol use-yes Drug use-no Regular exercise-no  Risk Factors: Alcohol Use: <1 (03/08/2009) Exercise: no (08/27/2006)  Risk Factors: Smoking Status: quit (03/08/2009) Packs/Day: 1-2 packs per day (03/08/2009) Passive Smoke Exposure: no (03/08/2009)  Past medical, surgical, family and social histories (including risk factors) reviewed, and no changes noted (except as noted below).  Past Medical History: Reviewed history from 05/23/2008 and no changes required. sleep apnea Dementia Depression Diabetes mellitus, type II Hyperlipidemia Low back pain ASBESTOSIS     -  PFT's 05/23/08 FEV1 2.61 ( 90%) ratio 77   no improvment after B2, nl DLC0  Past Surgical History: Reviewed history from 15-Apr-2008 and no changes required. Cataract extraction Lumbar laminectomy Lumbar fusion foot fractures Tonsillectomy Melanoma lower left back "precancer"..............................................Marland KitchenGrubar  Family History: Reviewed history from 04-15-08 and no changes required. Father died of cancer- testicular Family History of Prostate CA 1st degree relative <50 mother died of pancreatic cancer Family History of Arthritis  Social History: Reviewed history from 05/23/2008 and no changes required. Retired Married Pos smoking hx since age 17. had smoked up to 2 ppd. > quit 04/2008 Alcohol use-yes Drug use-no Regular exercise-no  Review of Systems  The patient denies anorexia, fever, weight loss, weight gain, vision loss, decreased hearing, hoarseness, chest pain, syncope, dyspnea on exertion, peripheral edema, prolonged cough, headaches, hemoptysis, abdominal pain, melena, hematochezia, severe indigestion/heartburn, hematuria, incontinence, genital sores, muscle weakness, suspicious skin lesions, transient blindness, difficulty walking, depression, unusual weight change, abnormal bleeding, enlarged lymph nodes, angioedema, and breast masses.    Physical Exam  General:   alert, cachetic, and pale.   Head:  atraumatic and male-pattern balding.   Eyes:  pupils equal and pupils reactive to light.   Ears:  R ear normal and L ear normal.   Nose:  External nasal examination shows no deformity or inflammation. Nasal mucosa are pink and moist without lesions or exudates. Mouth:  pharynx pink and moist and no erythema.   Neck:  No deformities, masses, or tenderness noted. Lungs:  increased bronchial bresth sounds, no rales slight end exp wheezing Heart:  normal rate and regular rhythm.   Abdomen:  soft and non-tender.   Msk:  No deformity or scoliosis noted of thoracic or lumbar spine.   Pulses:  R and L carotid,radial,femoral,dorsalis pedis and posterior tibial pulses are full and equal bilaterally Extremities:  No clubbing, cyanosis, edema, or deformity noted with normal full range of motion of all joints.   Neurologic:  No cranial nerve deficits noted. Station and gait are normal. Plantar reflexes are down-going bilaterally. DTRs are symmetrical throughout. Sensory, motor and coordinative functions appear intact.   Impression & Recommendations:  Problem # 1:  WEIGHT LOSS, ABNORMAL (ICD-783.21) he feels thaqt this is due  to te patches  Problem # 2:  TOBACCO ABUSE (ICD-305.1)  Encouraged smoking cessation and discussed different methods for smoking cessation.   Problem # 3:  LOW BACK PAIN (ICD-724.2)  The following medications were removed from the medication list:    Fentanyl 75 Mcg/hr Pt72 (Fentanyl) .Marland Kitchen... Change every 72 hours His updated medication list for this problem includes:    Celebrex 200 Mg Caps (Celecoxib) ..... One by mouth two times a day    Morphine Sulfate Cr 30 Mg Xr12h-tab (Morphine sulfate) ..... One by mouth two times a day  Discussed use of moist heat or ice, modified activities, medications, and stretching/strengthening exercises. Back care instructions given. To be seen in 2 weeks if no improvement; sooner if worsening of symptoms.    Complete Medication List: 1)  Valium 5 Mg Tabs (Diazepam) .... One by mouth q hs 2)  Lexapro 10 Mg Tabs (Escitalopram oxalate) .Marland Kitchen.. 1 once daily 3)  Lyrica 75 Mg Caps (Pregabalin) .Marland Kitchen.. 1 once daily 4)  Celebrex 200 Mg Caps (Celecoxib) .... One by mouth two times a day 5)  Calcium 500/d 500-125 Mg-unit Tabs (Calcium carbonate-vitamin d) .... Once daily 6)  Multivitamins Tabs (Multiple vitamin) .... Once daily 7)  Lorazepam 0.5 Mg Tabs (Lorazepam) .... One by mouth bid 8)  Fish Oil 1000 Mg Caps (Omega-3 fatty acids) .Marland Kitchen.. 1 once daily 9)  Morphine Sulfate Cr 30 Mg Xr12h-tab (Morphine sulfate) .... One by mouth two times a day  Patient Instructions: 1)  start with long acting morphine one 30mg  tablet two times a day 2)  if that does not hold the pain you may increased to every 8 hours ( total of 90 mg a day 3)  Please schedule a follow-up appointment in 2 weeks. 4)  at the same time as you take the first morphine you remove the patch 5)  for the nausea until the fentanyl patch you may use  the zuplenz on every eight hours  to knock out the nausea Prescriptions: MORPHINE SULFATE CR 30 MG XR12H-TAB (MORPHINE SULFATE) one by mouth two times a day  #60 x 0   Entered and Authorized by:   Stacie Glaze MD   Signed by:   Stacie Glaze MD on 03/08/2009   Method used:   Print then Give to Patient   RxID:   [id number]

## 2010-02-13 NOTE — Assessment & Plan Note (Signed)
Summary: discuss results while in hosptial/bmw   Vital Signs:  Patient profile:   77 year old male Height:      73 inches Weight:      170 pounds BMI:     22.51 Temp:     98.2 degrees F oral Pulse rate:   88 / minute Resp:     14 per minute BP sitting:   130 / 60  (left arm)  Vitals Entered By: Willy Eddy, LPN (September 29, 2009 5:00 PM) CC: [facility]osptial-to discuss increasing avinza to 120 before filling script Is Patient Diabetic? No   Primary Care Provider:  Stacie Glaze MD  CC:  [facility]-to discuss increasing avinza to 120 before filling script.  History of Present Illness: monitering of pain control and medication recent stay and rehab facility increased back pain that he rates as 7/10 activity is low, minimal exercize since he left the rehab program monitering due to iron deficiency anemia and DM  Preventive Screening-Counseling & Management  Alcohol-Tobacco     Alcohol drinks/day: <1     Smoking Status: quit     Smoking Cessation Counseling: yes     Packs/Day: 1-2 packs per day     Year Started: 1960     Year Quit: september 2010     Passive Smoke Exposure: no     Tobacco Counseling: to remain off tobacco products  Problems Prior to Update: 1)  Other Specified Iron Deficiency Anemias  (ICD-280.8) 2)  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) 3)  Cerumen Impaction, Bilateral  (ICD-380.4) 4)  Gout  (ICD-274.9) 5)  Nausea  (ICD-787.02) 6)  Sleepiness  (ICD-780.09) 7)  Lung Nodule  (ICD-518.89) 8)  Abdominal Pain, Epigastric  (ICD-789.06) 9)  Weight Loss, Abnormal  (ICD-783.21) 10)  Chills Without Fever  (ICD-780.64) 11)  Tobacco Abuse  (ICD-305.1) 12)  Observation For Suspected Malignant Neoplasm  (ICD-V71.1) 13)  Accidental Falls, Recurrent  (ICD-E888.9) 14)  Low Back Pain  (ICD-724.2) 15)  Impaired Fasting Glucose  (ICD-790.21) 16)  Lumbar Radiculopathy, Left  (ICD-724.4) 17)  Hyperlipidemia  (ICD-272.4) 18)   Dm W/manifestation Nec, Type II, Uncontrolled  (ICD-250.82) 19)  Depression  (ICD-311) 20)  Dementia  (ICD-294.8) 21)  Obstructive Sleep Apnea  (ICD-327.23) 22)  Melanoma, Malignant, Trunk  (ICD-172.5) 23)  Anemia Nos  (ICD-285.9) 24)  Disorder, Lumbar Disc W/myelopathy  (ICD-722.73)  Current Problems (verified): 1)  Other Specified Iron Deficiency Anemias  (ICD-280.8) 2)  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) 3)  Cerumen Impaction, Bilateral  (ICD-380.4) 4)  Gout  (ICD-274.9) 5)  Nausea  (ICD-787.02) 6)  Sleepiness  (ICD-780.09) 7)  Lung Nodule  (ICD-518.89) 8)  Abdominal Pain, Epigastric  (ICD-789.06) 9)  Weight Loss, Abnormal  (ICD-783.21) 10)  Chills Without Fever  (ICD-780.64) 11)  Tobacco Abuse  (ICD-305.1) 12)  Observation For Suspected Malignant Neoplasm  (ICD-V71.1) 13)  Accidental Falls, Recurrent  (ICD-E888.9) 14)  Low Back Pain  (ICD-724.2) 15)  Impaired Fasting Glucose  (ICD-790.21) 16)  Lumbar Radiculopathy, Left  (ICD-724.4) 17)  Hyperlipidemia  (ICD-272.4) 18)  Dm W/manifestation Nec, Type II, Uncontrolled  (ICD-250.82) 19)  Depression  (ICD-311) 20)  Dementia  (ICD-294.8) 21)  Obstructive Sleep Apnea  (ICD-327.23) 22)  Melanoma, Malignant, Trunk  (ICD-172.5) 23)  Anemia Nos  (ICD-285.9) 24)  Disorder, Lumbar Disc W/myelopathy  (ICD-722.73)  Medications Prior to Update: 1)  Lexapro 10 Mg  Tabs (Escitalopram Oxalate) .Marland Kitchen.. 1 Once Daily 2)  Lyrica  75 Mg  Caps (Pregabalin) .Marland Kitchen.. 1 By Mouth Two Times A Day For Back Pain As A Regular Schedule 3)  Avinza 120 Mg Xr24h-Cap (Morphine Sulfate Beads) .... One By Mouth Daily 4)  Nu-Iron 150 Mg Caps (Polysaccharide Iron Complex) .Marland Kitchen.. 1 By Mouth Daily 5)  Miralax  Powd (Polyethylene Glycol 3350) .Marland Kitchen.. 1 Once Daily 6)  Prednisone 5 Mg Tabs (Prednisone) .... 1/2 By Mouth Daily For 5 Days Then Stop 7)  Methocarbamol 500 Mg Tabs (Methocarbamol) .Marland Kitchen.. 1 Every 6 Hours As Needed Muscle Spasm 8)  Zofran 4 Mg Tabs  (Ondansetron Hcl) .Marland Kitchen.. 1 Every 4-6 Hours As Needed For Nausea 9)  Colcrys 0.6 Mg Tabs (Colchicine) .... One By Mouth Daily For Gout 10)  Polyethylene Glycol 3350  Powd (Polyethylene Glycol 3350) .Marland KitchenMarland KitchenMarland Kitchen 17 Gram Daily To Keep Stools Soft If Stoola Are  Are Not Daily May Increase To Twice A Day  Current Medications (verified): 1)  Lexapro 10 Mg  Tabs (Escitalopram Oxalate) .Marland Kitchen.. 1 Once Daily 2)  Lyrica 75 Mg  Caps (Pregabalin) .Marland Kitchen.. 1 By Mouth Two Times A Day 3)  Avinza 120 Mg Xr24h-Cap (Morphine Sulfate Beads) .... One By Mouth Daily 4)  Nu-Iron 150 Mg Caps (Polysaccharide Iron Complex) .Marland Kitchen.. 1 By Mouth Daily 5)  Miralax  Powd (Polyethylene Glycol 3350) .Marland Kitchen.. 1 Once Daily 6)  Methocarbamol 500 Mg Tabs (Methocarbamol) .Marland Kitchen.. 1 Every 6 Hours As Needed Muscle Spasm 7)  Zofran 4 Mg Tabs (Ondansetron Hcl) .Marland Kitchen.. 1 Every 4-6 Hours As Needed For Nausea 8)  Colcrys 0.6 Mg Tabs (Colchicine) .... One By Mouth Daily For Gout 9)  Aspirin 81 Mg Tabs (Aspirin) .Marland Kitchen.. 1 Once Daily 10)  Multivitamins  Caps (Multiple Vitamin) .Marland Kitchen.. 1 Once Daily 11)  Vitamin C .... 1 Once Daily 12)  Vitamin D .... 1 Once Daily 13)  Diazepam 5 Mg Tabs (Diazepam) .Marland Kitchen.. 1qhs As Needed  Allergies (verified): 1)  ! Sulfa 2)  * Dilaudid 3)  Fentanyl (Fentanyl)  Past History:  Family History: Last updated: April 19, 2008 Father died of cancer- testicular Family History of Prostate CA 1st degree relative <50 mother died of pancreatic cancer Family History of Arthritis  Social History: Last updated: 05/23/2008 Retired Married Pos smoking hx since age 36. had smoked up to 2 ppd. > quit 04/2008 Alcohol use-yes Drug use-no Regular exercise-no  Risk Factors: Alcohol Use: <1 (09/29/2009) Exercise: no (08/27/2006)  Risk Factors: Smoking Status: quit (09/29/2009) Packs/Day: 1-2 packs per day (09/29/2009) Passive Smoke Exposure: no (09/29/2009)  Past medical, surgical, family and social histories (including risk factors) reviewed, and no  changes noted (except as noted below).  Past Medical History: Reviewed history from 05/23/2008 and no changes required. sleep apnea Dementia Depression Diabetes mellitus, type II Hyperlipidemia Low back pain ASBESTOSIS     -  PFT's 05/23/08 FEV1 2.61 ( 90%) ratio 77   no improvment after B2, nl DLC0  Past Surgical History: Reviewed history from Apr 19, 2008 and no changes required. Cataract extraction Lumbar laminectomy Lumbar fusion foot fractures Tonsillectomy Melanoma lower left back "precancer"..............................................Marland KitchenGrubar  Family History: Reviewed history from 2008/04/19 and no changes required. Father died of cancer- testicular Family History of Prostate CA 1st degree relative <50 mother died of pancreatic cancer Family History of Arthritis  Social History: Reviewed history from 05/23/2008 and no changes required. Retired Married Pos smoking hx since age 4. had smoked up to 2 ppd. > quit 04/2008 Alcohol use-yes Drug use-no Regular exercise-no  Review of Systems  The patient complains of weight loss, hoarseness, peripheral edema, difficulty walking, and depression.  The patient denies anorexia, fever, vision loss, decreased hearing, chest pain, syncope, dyspnea on exertion, prolonged cough, headaches, hemoptysis, abdominal pain, melena, hematochezia, severe indigestion/heartburn, hematuria, incontinence, genital sores, muscle weakness, suspicious skin lesions, transient blindness, unusual weight change, abnormal bleeding, enlarged lymph nodes, angioedema, breast masses, and testicular masses.         Flu Vaccine Consent Questions     Do you have a history of severe allergic reactions to this vaccine? no    Any prior history of allergic reactions to egg and/or gelatin? no    Do you have a sensitivity to the preservative Thimersol? no    Do you have a past history of Guillan-Barre Syndrome? no    Do you currently have an acute febrile  illness? no    Have you ever had a severe reaction to latex? no    Vaccine information given and explained to patient? yes    Are you currently pregnant? no    Lot Number:[id number]   Exp Date:07/14/2010   Site Given  Left Deltoid IM   Physical Exam  General:  alert, cachetic, and pale.   Head:  atraumatic and male-pattern balding.   Eyes:  pupils equal and pupils reactive to light.   Ears:  R ear normal and L ear normal.   Neck:  No deformities, masses, or tenderness noted. Lungs:  normal respiratory effort and no wheezes.   Heart:  normal rate and regular rhythm.   Abdomen:  soft, no guarding, no rigidity, and bowel sounds hypoactive.   Msk:  no joint warmth and no redness over joints.   Extremities:  trace left pedal edema and trace right pedal edema.    Diabetes Management Exam:    Foot Exam (with socks and/or shoes not present):       Sensory-Pinprick/Light touch:          Left medial foot (L-4): diminished          Left dorsal foot (L-5): diminished          Left lateral foot (S-1): diminished          Right medial foot (L-4): diminished          Right dorsal foot (L-5): diminished          Right lateral foot (S-1): diminished   Impression & Recommendations:  Problem # 1:  DM W/MANIFESTATION NEC, TYPE II, UNCONTROLLED (ICD-250.82) Assessment Deteriorated moniter A1c His updated medication list for this problem includes:    Aspirin 81 Mg Tabs (Aspirin) .Marland Kitchen... 1 once daily  Orders: TLB-A1C / Hgb A1C (Glycohemoglobin) (83036-A1C) due prealbumin  Labs Reviewed: Creat: 1.2 (09/05/2009)    Reviewed HgBA1c results: 5.1 (09/05/2009)  5.3 (05/26/2009)  Problem # 2:  OTHER SPECIFIED IRON DEFICIENCY ANEMIAS (ICD-280.8) Assessment: Unchanged  His updated medication list for this problem includes:    Nu-iron 150 Mg Caps (Polysaccharide iron complex) .Marland Kitchen... 1 by mouth daily  Hgb: 9.8 (09/05/2009)   Hct: 29.7 (09/05/2009)   Platelets: 346.0 (09/05/2009) RBC: 3.31  (09/05/2009)   RDW: 16.3 (09/05/2009)   WBC: 12.5 (09/05/2009) MCV: 89.6 (09/05/2009)   MCHC: 33.0 (09/05/2009) Iron: 47 (09/05/2009)   % Sat: 18.0 (09/05/2009) TSH: 1.67 (08/27/2006)  Problem # 3:  LUMBAR RADICULOPATHY, LEFT (ICD-724.4) increase aviza and monier for pain control consider referral to DR Vear Clock for pain management His updated medication list for this problem includes:    Avinza 120  Mg Xr24h-cap (Morphine sulfate beads) ..... One by mouth daily    Methocarbamol 500 Mg Tabs (Methocarbamol) .Marland Kitchen... 1 every 6 hours as needed muscle spasm    Aspirin 81 Mg Tabs (Aspirin) .Marland Kitchen... 1 once daily  Discussed use of moist heat or ice, modified activities, medications, and stretching/strengthening exercises. Back care instructions given. To be seen in 2 weeks if no improvement; sooner if worsening of symptoms.   Complete Medication List: 1)  Lexapro 10 Mg Tabs (Escitalopram oxalate) .Marland Kitchen.. 1 once daily 2)  Lyrica 75 Mg Caps (Pregabalin) .Marland Kitchen.. 1 by mouth two times a day 3)  Avinza 120 Mg Xr24h-cap (Morphine sulfate beads) .... One by mouth daily 4)  Nu-iron 150 Mg Caps (Polysaccharide iron complex) .Marland Kitchen.. 1 by mouth daily 5)  Miralax Powd (Polyethylene glycol 3350) .Marland Kitchen.. 1 once daily 6)  Methocarbamol 500 Mg Tabs (Methocarbamol) .Marland Kitchen.. 1 every 6 hours as needed muscle spasm 7)  Zofran 4 Mg Tabs (Ondansetron hcl) .Marland Kitchen.. 1 every 4-6 hours as needed for nausea 8)  Colcrys 0.6 Mg Tabs (Colchicine) .... One by mouth daily for gout 9)  Aspirin 81 Mg Tabs (Aspirin) .Marland Kitchen.. 1 once daily 10)  Multivitamins Caps (Multiple vitamin) .Marland Kitchen.. 1 once daily 11)  Vitamin C  .... 1 once daily 12)  Vitamin D  .... 1 once daily 13)  Diazepam 5 Mg Tabs (Diazepam) .Marland Kitchen.. 1qhs as needed  Other Orders: Flu Vaccine 3yrs + MEDICARE PATIENTS (Z6109) Administration Flu vaccine - MCR (G0008) Pneumococcal Vaccine (60454) Admin 1st Vaccine (09811) TLB-B12 + Folate Pnl ([id number])  Patient Instructions: 1)  Please  schedule a follow-up appointment in 1 month.   Immunizations Administered:  Pneumonia Vaccine:    Vaccine Type: Pneumovax (Medicare)    Site: right deltoid    Mfr: Merck    Dose: 0.5 ml    Route: IM    Given by: Willy Eddy, LPN    Exp. Date: 02/04/2011    Lot #: [id number]    VIS given: 12/19/08 version given September 29, 2009.

## 2010-02-13 NOTE — Progress Notes (Signed)
Summary: increase pain meds?  Phone Note Call from Patient   Caller: Patient Call For: Stacie Glaze MD Summary of Call: Pt s back pain is back and the Avinza 90 mg  one  by mouth daily and needs to increase the dose?  Is this possible? [phone] Initial call taken by: Lynann Beaver CMA,  September 25, 2009 9:55 AM  Follow-up for Phone Call        will need to wait for Dr. Lovell Sheehan to decide on narcotic strength  Additional Follow-up for Phone Call Additional follow up Details #1::        Avinza 120 mg. one by mouth daily Additional Follow-up by: Lynann Beaver CMA,  September 25, 2009 3:02 PM    New/Updated Medications: AVINZA 120 MG XR24H-CAP (MORPHINE SULFATE BEADS) one by mouth daily Prescriptions: AVINZA 120 MG XR24H-CAP (MORPHINE SULFATE BEADS) one by mouth daily  #30 x 0   Entered by:   Lynann Beaver CMA   Authorized by:   Birdie Sons MD   Signed by:   Lynann Beaver CMA on 09/25/2009   Method used:   Print then Give to Patient   RxID:   [id number]  Notified. pt to pick up prescription.

## 2010-02-13 NOTE — Progress Notes (Signed)
Summary: eye better but tearing a lot & very itchy, a little red & a litt  Phone Note Call from Patient Call back at [facility] Phone [phone]   Summary of Call: Used up all of the med for eye, wife threw away box.  Eye still itchy and a little red and a little tender when he presses on lid, much improved, but still tearing a lot.  Refill or advice.  Reyne Dumas.  Allergic to sulfa.  Initial call taken by: Rudy Jew, RN,  April 11, 2009 10:35 AM  Follow-up for Phone Call        per dr Perry Mount get some otc allergy drop and use as directed Follow-up by: Willy Eddy, LPN,  April 11, 2009 1:37 PM  Additional Follow-up for Phone Call Additional follow up Details #1::        Phone Call Completed Additional Follow-up by: Rudy Jew, RN,  April 11, 2009 1:43 PM

## 2010-02-13 NOTE — Progress Notes (Signed)
Summary: pick up Rx  Phone Note From Pharmacy   Caller: Patient Call For: Stacie Glaze MD Summary of Call: has been on Fentanyl patch- cannot tolerate and is going to stop today; causing nausea and cold sweats. Is he going to have withdrawal symptoms and what can he do? ph [phone] Initial call taken by: Raechel Ache, RN,  March 06, 2009 9:29 AM Initial call taken by: Raechel Ache, RN,  March 06, 2009 10:03 AM  Follow-up for Phone Call        cant just stop it- will have withdrawals- give him an appointment this week and tell him to take it until he sees dr Lovell Sheehan- we have an opening friday at 3:15 Follow-up by: Willy Eddy, LPN,  March 06, 2009 9:43 AM  Additional Follow-up for Phone Call Additional follow up Details #1::        he will come today to pick up Rx for 75. Additional Follow-up by: Raechel Ache, RN,  March 06, 2009 10:04 AM    New/Updated Medications: FENTANYL 75 MCG/HR PT72 (FENTANYL) change every 72 hours Prescriptions: FENTANYL 75 MCG/HR PT72 (FENTANYL) change every 72 hours  #2 boxes x 0   Entered by:   Willy Eddy, LPN   Authorized by:   Stacie Glaze MD   Signed by:   Willy Eddy, LPN on 43/32/9518   Method used:   Print then Give to Patient   RxID:   [id number]

## 2010-02-13 NOTE — Progress Notes (Signed)
Summary: bad pain sometimes  Phone Note Call from Patient Call back at [phone]   Caller: [facility] Reason for Call: Acute Illness Summary of Call: A lot of pain, avinza that Dr. Shela Commons gave, also have hydrocodone that Dr. Shela Commons also gave.  Can he take it for the breakthrough pain.  Not every day but sometimes really bad.   Also Dr. Danielle Dess referred to Dr. Vear Clock for pain management, no appointment for over a month now.  He sent records Mar 17.  She just called them both.  Anything you can do?  I think with your permission to use hydrocodone he'll be okay.   Initial call taken by: Rudy Jew, RN,  May 16, 2009 11:03 AM  Follow-up for Phone Call        per dr Lovell Sheehan may use hydrocodone no more than twice a day, we will see if we can help with appointment   Follow-up by: Willy Eddy, LPN,  May 17, 2438 4:26 PM  Additional Follow-up for Phone Call Additional follow up Details #1::        Wife notified. Additional Follow-up by: Lynann Beaver CMA,  May 16, 2009 4:32 PM

## 2010-02-13 NOTE — Assessment & Plan Note (Signed)
Summary: f/u per pt//slm   Vital Signs:  Patient profile:   77 year old male Height:      73 inches Weight:      166 pounds Temp:     98.8 degrees F oral Pulse rate:   122 / minute Pulse rhythm:   regular Resp:     12 per minute BP sitting:   142 / 60  Vitals Entered By: Lynann Beaver CMA AAMA (November 27, 2009 2:13 PM) CC: rov Is Patient Diabetic? Yes Pain Assessment Patient in pain? no        Primary Care Provider:  Stacie Glaze MD  CC:  rov.  History of Present Illness: follow up pain management back pain is still "only partisally controlled DM monitering CBG's in the 120 range in AM orthostatic changes in blood pressure have resolved with better hydration pt still is not mobile enought to help present falls spending most of his day in chair or bed  Preventive Screening-Counseling & Management  Alcohol-Tobacco     Smoking Status: current     Smoking Cessation Counseling: yes     Tobacco Counseling: to quit use of tobacco products  Problems Prior to Update: 1)  Atrial Fibrillation  (ICD-427.31) 2)  Hypotension, Orthostatic  (ICD-458.0) 3)  Other Specified Iron Deficiency Anemias  (ICD-280.8) 4)  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) 5)  Cerumen Impaction, Bilateral  (ICD-380.4) 6)  Gout  (ICD-274.9) 7)  Nausea  (ICD-787.02) 8)  Sleepiness  (ICD-780.09) 9)  Lung Nodule  (ICD-518.89) 10)  Abdominal Pain, Epigastric  (ICD-789.06) 11)  Weight Loss, Abnormal  (ICD-783.21) 12)  Chills Without Fever  (ICD-780.64) 13)  Tobacco Abuse  (ICD-305.1) 14)  Observation For Suspected Malignant Neoplasm  (ICD-V71.1) 15)  Accidental Falls, Recurrent  (ICD-E888.9) 16)  Low Back Pain  (ICD-724.2) 17)  Impaired Fasting Glucose  (ICD-790.21) 18)  Lumbar Radiculopathy, Left  (ICD-724.4) 19)  Hyperlipidemia  (ICD-272.4) 20)  Dm W/manifestation Nec, Type II, Uncontrolled  (ICD-250.82) 21)  Depression  (ICD-311) 22)  Dementia  (ICD-294.8) 23)   Obstructive Sleep Apnea  (ICD-327.23) 24)  Melanoma, Malignant, Trunk  (ICD-172.5) 25)  Anemia Nos  (ICD-285.9) 26)  Disorder, Lumbar Disc W/myelopathy  (ICD-722.73)  Medications Prior to Update: 1)  Lexapro 10 Mg  Tabs (Escitalopram Oxalate) .Marland Kitchen.. 1 Once Daily 2)  Lyrica 75 Mg  Caps (Pregabalin) .Marland Kitchen.. 1 By Mouth Q Hs 3)  Avinza 90 Mg Xr24h-Cap (Morphine Sulfate Beads) .Marland Kitchen.. 1 Once Daily 4)  Nu-Iron 150 Mg Caps (Polysaccharide Iron Complex) .Marland Kitchen.. 1 By Mouth Daily 5)  Miralax  Powd (Polyethylene Glycol 3350) .Marland Kitchen.. 1 Once Daily 6)  Methocarbamol 500 Mg Tabs (Methocarbamol) .Marland Kitchen.. 1 Every 6 Hours As Needed Muscle Spasm 7)  Zofran 4 Mg Tabs (Ondansetron Hcl) .Marland Kitchen.. 1 Every 4-6 Hours As Needed For Nausea 8)  Colcrys 0.6 Mg Tabs (Colchicine) .... One By Mouth Daily For Gout 9)  Aspirin 81 Mg Tabs (Aspirin) .Marland Kitchen.. 1 Once Daily 10)  Multivitamins  Caps (Multiple Vitamin) .Marland Kitchen.. 1 Once Daily 11)  Vitamin C .... 1 Once Daily 12)  Vitamin D .... 1 Once Daily 13)  Diazepam 5 Mg Tabs (Diazepam) .Marland Kitchen.. 1qhs As Needed 14)  Tamsulosin Hcl 0.4 Mg Caps (Tamsulosin Hcl) .Marland Kitchen.. 1 Once Daily  Current Medications (verified): 1)  Lexapro 10 Mg  Tabs (Escitalopram Oxalate) .Marland Kitchen.. 1 Once Daily 2)  Miralax  Powd (Polyethylene Glycol 3350) .Marland Kitchen.. 1 Once Daily 3)  Methocarbamol 500 Mg Tabs (Methocarbamol) .Marland KitchenMarland KitchenMarland Kitchen  1 Every 6 Hours As Needed Muscle Spasm 4)  Zofran 4 Mg Tabs (Ondansetron Hcl) .Marland Kitchen.. 1 Every 4-6 Hours As Needed For Nausea 5)  Colcrys 0.6 Mg Tabs (Colchicine) .... One By Mouth Daily For Gout 6)  Multivitamins  Caps (Multiple Vitamin) .Marland Kitchen.. 1 Once Daily 7)  Diazepam 5 Mg Tabs (Diazepam) .Marland Kitchen.. 1qhs As Needed 8)  Tamsulosin Hcl 0.4 Mg Caps (Tamsulosin Hcl) .Marland Kitchen.. 1 Once Daily 9)  Ms Contin 30 Mg Xr12h-Tab (Morphine Sulfate) .... One By Mouth Bid 10)  Cartia Xt 120 Mg Xr24h-Cap (Diltiazem Hcl Coated Beads) .... One By Mouth Daily  Allergies (verified): 1)  ! Sulfa 2)  * Dilaudid 3)  Fentanyl (Fentanyl)  Past History:  Family  History: Last updated: Apr 15, 2008 Father died of cancer- testicular Family History of Prostate CA 1st degree relative <50 mother died of pancreatic cancer Family History of Arthritis  Social History: Last updated: 05/23/2008 Retired Married Pos smoking hx since age 32. had smoked up to 2 ppd. > quit 04/2008 Alcohol use-yes Drug use-no Regular exercise-no  Risk Factors: Alcohol Use: <1 (10/31/2009) Exercise: no (08/27/2006)  Risk Factors: Smoking Status: current (11/27/2009) Packs/Day: 1-2 packs per day (10/31/2009) Passive Smoke Exposure: no (10/31/2009)  Past medical, surgical, family and social histories (including risk factors) reviewed, and no changes noted (except as noted below).  Past Medical History: Reviewed history from 05/23/2008 and no changes required. sleep apnea Dementia Depression Diabetes mellitus, type II Hyperlipidemia Low back pain ASBESTOSIS     -  PFT's 05/23/08 FEV1 2.61 ( 90%) ratio 77   no improvment after B2, nl DLC0  Past Surgical History: Reviewed history from 15-Apr-2008 and no changes required. Cataract extraction Lumbar laminectomy Lumbar fusion foot fractures Tonsillectomy Melanoma lower left back "precancer"..............................................Marland KitchenGrubar  Family History: Reviewed history from 04/15/08 and no changes required. Father died of cancer- testicular Family History of Prostate CA 1st degree relative <50 mother died of pancreatic cancer Family History of Arthritis  Social History: Reviewed history from 05/23/2008 and no changes required. Retired Married Pos smoking hx since age 83. had smoked up to 2 ppd. > quit 04/2008 Alcohol use-yes Drug use-no Regular exercise-no Smoking Status:  current  Review of Systems  The patient denies anorexia, fever, weight loss, weight gain, vision loss, decreased hearing, hoarseness, chest pain, syncope, dyspnea on exertion, peripheral edema, prolonged cough, headaches,  hemoptysis, abdominal pain, melena, hematochezia, severe indigestion/heartburn, hematuria, incontinence, genital sores, muscle weakness, suspicious skin lesions, transient blindness, difficulty walking, depression, unusual weight change, abnormal bleeding, enlarged lymph nodes, angioedema, breast masses, and testicular masses.    Physical Exam  General:  alert, cachetic, and pale.   Head:  atraumatic and male-pattern balding.   Eyes:  pupils equal and pupils reactive to light.   Ears:  R ear normal and L ear normal.   Nose:  External nasal examination shows no deformity or inflammation. Nasal mucosa are pink and moist without lesions or exudates. Mouth:  pharynx pink and moist and no erythema.   Neck:  No deformities, masses, or tenderness noted. Lungs:  normal respiratory effort and no wheezes.   Heart:  normal rate and regular rhythm.   Abdomen:  soft, no guarding, no rigidity, and bowel sounds hypoactive.   Msk:  decreased ROM, joint tenderness, SI joint tenderness, and trigger point tenderness.   Extremities:  trace left pedal edema and trace right pedal edema.   Neurologic:  cranial nerves II-XII intact and abnormal gait.     Impression & Recommendations:  Problem # 1:  HYPOTENSION, ORTHOSTATIC (ICD-458.0) not present today but could these episodes be rate related?  Problem # 2:  TOBACCO ABUSE (ICD-305.1) continues to smoke  Problem # 3:  WEIGHT LOSS, ABNORMAL (ICD-783.21) stabilized  weigth is up a few pounds  Problem # 4:  ACCIDENTAL FALLS, RECURRENT (ICD-E888.9) due to drugs and lack of exercize Dr Vear Clock titrated the MS down but pain control is "adequate" 1/2 mile  1000 steps is necessary  Problem # 5:  LUMBAR RADICULOPATHY, LEFT (ICD-724.4)  The following medications were removed from the medication list:    Avinza 90 Mg Xr24h-cap (Morphine sulfate beads) .Marland Kitchen... 1 once daily    Aspirin 81 Mg Tabs (Aspirin) .Marland Kitchen... 1 once daily His updated medication list for this  problem includes:    Methocarbamol 500 Mg Tabs (Methocarbamol) .Marland Kitchen... 1 every 6 hours as needed muscle spasm    Ms Contin 30 Mg Xr12h-tab (Morphine sulfate) ..... One by mouth bid  Discussed use of moist heat or ice, modified activities, medications, and stretching/strengthening exercises. Back care instructions given. To be seen in 2 weeks if no improvement; sooner if worsening of symptoms.   Problem # 6:  ATRIAL FIBRILLATION (ICD-427.31) Assessment: New new diagnosis? respiratory dz vs PAF EMS has noted episodic AF? holter 48 hours if detected will refer to cardiology The following medications were removed from the medication list:    Aspirin 81 Mg Tabs (Aspirin) .Marland Kitchen... 1 once daily His updated medication list for this problem includes:    Cartia Xt 120 Mg Xr24h-cap (Diltiazem hcl coated beads) ..... One by mouth daily  Orders: Cardiology Referral (Cardiology)  Complete Medication List: 1)  Lexapro 10 Mg Tabs (Escitalopram oxalate) .Marland Kitchen.. 1 once daily 2)  Miralax Powd (Polyethylene glycol 3350) .Marland Kitchen.. 1 once daily 3)  Methocarbamol 500 Mg Tabs (Methocarbamol) .Marland Kitchen.. 1 every 6 hours as needed muscle spasm 4)  Zofran 4 Mg Tabs (Ondansetron hcl) .Marland Kitchen.. 1 every 4-6 hours as needed for nausea 5)  Colcrys 0.6 Mg Tabs (Colchicine) .... One by mouth daily for gout 6)  Multivitamins Caps (Multiple vitamin) .Marland Kitchen.. 1 once daily 7)  Diazepam 5 Mg Tabs (Diazepam) .Marland Kitchen.. 1qhs as needed 8)  Tamsulosin Hcl 0.4 Mg Caps (Tamsulosin hcl) .Marland Kitchen.. 1 once daily 9)  Ms Contin 30 Mg Xr12h-tab (Morphine sulfate) .... One by mouth bid 10)  Cartia Xt 120 Mg Xr24h-cap (Diltiazem hcl coated beads) .... One by mouth daily  Patient Instructions: 1)  needs to start a short walk every day 2)  Please schedule a follow-up appointment   nOV 29 OR 30 MAY ADD ON Prescriptions: CARTIA XT 120 MG XR24H-CAP (DILTIAZEM HCL COATED BEADS) ONE by mouth DAILY  #30 x 11   Entered and Authorized by:   Stacie Glaze MD   Signed by:   Stacie Glaze MD on 11/27/2009   Method used:   Electronically to        [facility] (retail)       [address]       [place], [place]  [id number]       Ph: [phone] or [phone]       Fax: [phone]   RxID:   [id number]    Orders Added: 1)  Cardiology Referral [Cardiology] 2)  Est. Patient Level IV [27035]

## 2010-02-13 NOTE — Letter (Signed)
Summary: [facility]  [facility]   Imported By: Maryln Gottron 07/20/2009 15:59:46  _____________________________________________________________________  External Attachment:    Type:   Image     Comment:   External Document

## 2010-02-13 NOTE — Procedures (Signed)
Summary: SUMMARY REPORT  SUMMARY REPORT   Imported By: Mirna Mires 12/05/2009 11:17:54  _____________________________________________________________________  External Attachment:    Type:   Image     Comment:   External Document  Appended Document: SUMMARY REPORT holter moniter showed no afib or runs of beats  Appended Document: SUMMARY REPORT pt's wife informed

## 2010-02-13 NOTE — Assessment & Plan Note (Signed)
Summary: 1 month rov/njr   Vital Signs:  Patient profile:   77 year old male Height:      73 inches Weight:      172 pounds BMI:     22.77 Temp:     98.6 degrees F oral Pulse rate:   88 / minute Resp:     14 per minute BP sitting:   150 / 80  (left arm)  Vitals Entered By: Willy Eddy, LPN (May 26, 2009 10:11 AM) CC: roa   Primary Care Provider:  Dr. Darryll Capers  CC:  roa.  History of Present Illness: th ept notes increased wax in ears with loss of hearing has increased pain with standing ranges from 3-6 and if continues to stand or walk then increased to 8-9 the neurosurgeon  has recommedned giving the intervention 1 month or more ( [facility]) the pain is still responsive to IBU  or the hydrocodone  has constipation ( med side effect)  the pt wonders about the next steps with pain control has future appointment with Thyra Breed  Preventive Screening-Counseling & Management  Alcohol-Tobacco     Alcohol drinks/day: <1     Smoking Status: quit     Smoking Cessation Counseling: yes     Packs/Day: 1-2 packs per day     Year Started: 1958     Year Quit: september 2010     Passive Smoke Exposure: no  Problems Prior to Update: 1)  Gout  (ICD-274.9) 2)  Nausea  (ICD-787.02) 3)  Sleepiness  (ICD-780.09) 4)  Lung Nodule  (ICD-518.89) 5)  Abdominal Pain, Epigastric  (ICD-789.06) 6)  Weight Loss, Abnormal  (ICD-783.21) 7)  Chills Without Fever  (ICD-780.64) 8)  Tobacco Abuse  (ICD-305.1) 9)  Observation For Suspected Malignant Neoplasm  (ICD-V71.1) 10)  Accidental Falls, Recurrent  (ICD-E888.9) 11)  Low Back Pain  (ICD-724.2) 12)  Impaired Fasting Glucose  (ICD-790.21) 13)  Lumbar Radiculopathy, Left  (ICD-724.4) 14)  Hyperlipidemia  (ICD-272.4) 15)  Dm W/manifestation Nec, Type II, Uncontrolled  (ICD-250.82) 16)  Depression  (ICD-311) 17)  Dementia  (ICD-294.8) 18)  Obstructive Sleep Apnea  (ICD-327.23) 19)  Melanoma, Malignant, Trunk  (ICD-172.5) 20)   Anemia Nos  (ICD-285.9) 21)  Disorder, Lumbar Disc W/myelopathy  (ICD-722.73)  Current Problems (verified): 1)  Gout  (ICD-274.9) 2)  Nausea  (ICD-787.02) 3)  Sleepiness  (ICD-780.09) 4)  Lung Nodule  (ICD-518.89) 5)  Abdominal Pain, Epigastric  (ICD-789.06) 6)  Weight Loss, Abnormal  (ICD-783.21) 7)  Chills Without Fever  (ICD-780.64) 8)  Tobacco Abuse  (ICD-305.1) 9)  Observation For Suspected Malignant Neoplasm  (ICD-V71.1) 10)  Accidental Falls, Recurrent  (ICD-E888.9) 11)  Low Back Pain  (ICD-724.2) 12)  Impaired Fasting Glucose  (ICD-790.21) 13)  Lumbar Radiculopathy, Left  (ICD-724.4) 14)  Hyperlipidemia  (ICD-272.4) 15)  Dm W/manifestation Nec, Type II, Uncontrolled  (ICD-250.82) 16)  Depression  (ICD-311) 17)  Dementia  (ICD-294.8) 18)  Obstructive Sleep Apnea  (ICD-327.23) 19)  Melanoma, Malignant, Trunk  (ICD-172.5) 20)  Anemia Nos  (ICD-285.9) 21)  Disorder, Lumbar Disc W/myelopathy  (ICD-722.73)  Medications Prior to Update: 1)  Valium 5 Mg  Tabs (Diazepam) .... One By Mouth Q Hs 2)  Lexapro 10 Mg  Tabs (Escitalopram Oxalate) .Marland Kitchen.. 1 Once Daily 3)  Lyrica 75 Mg  Caps (Pregabalin) .Marland Kitchen.. 1 Once Daily 4)  Celebrex 200 Mg  Caps (Celecoxib) .... One By Mouth Two Times A Day 5)  Calcium 500/d 500-125 Mg-Unit  Tabs (  Calcium Carbonate-Vitamin D) .... Once Daily 6)  Multivitamins   Tabs (Multiple Vitamin) .... Once Daily 7)  Lorazepam 0.5 Mg Tabs (Lorazepam) .... One By Mouth Bid 8)  Fish Oil 1000 Mg Caps (Omega-3 Fatty Acids) .Marland Kitchen.. 1 Once Daily 9)  Avinza 60 Mg Xr24h-Cap (Morphine Sulfate Beads) .Marland Kitchen.. 1 Once Daily  Current Medications (verified): 1)  Valium 5 Mg  Tabs (Diazepam) .... One By Mouth Q Hs 2)  Lexapro 10 Mg  Tabs (Escitalopram Oxalate) .Marland Kitchen.. 1 Once Daily 3)  Lyrica 75 Mg  Caps (Pregabalin) .Marland Kitchen.. 1 Once Daily 4)  Celebrex 200 Mg  Caps (Celecoxib) .... One By Mouth Two Times A Day 5)  Calcium 500/d 500-125 Mg-Unit  Tabs (Calcium Carbonate-Vitamin D) .... Once  Daily 6)  Multivitamins   Tabs (Multiple Vitamin) .... Once Daily 7)  Fish Oil 1000 Mg Caps (Omega-3 Fatty Acids) .Marland Kitchen.. 1 Once Daily 8)  Avinza 90 Mg Xr24h-Cap (Morphine Sulfate Beads) .... One By Mouth Daly  Allergies (verified): 1)  ! Sulfa 2)  * Dilaudid 3)  Fentanyl (Fentanyl)  Past History:  Family History: Last updated: 05/12/08 Father died of cancer- testicular Family History of Prostate CA 1st degree relative <50 mother died of pancreatic cancer Family History of Arthritis  Social History: Last updated: 05/23/2008 Retired Married Pos smoking hx since age 54. had smoked up to 2 ppd. > quit 04/2008 Alcohol use-yes Drug use-no Regular exercise-no  Risk Factors: Alcohol Use: <1 (05/26/2009) Exercise: no (08/27/2006)  Risk Factors: Smoking Status: quit (05/26/2009) Packs/Day: 1-2 packs per day (05/26/2009) Passive Smoke Exposure: no (05/26/2009)  Past medical, surgical, family and social histories (including risk factors) reviewed, and no changes noted (except as noted below).  Past Medical History: Reviewed history from 05/23/2008 and no changes required. sleep apnea Dementia Depression Diabetes mellitus, type II Hyperlipidemia Low back pain ASBESTOSIS     -  PFT's 05/23/08 FEV1 2.61 ( 90%) ratio 77   no improvment after B2, nl DLC0  Past Surgical History: Reviewed history from May 12, 2008 and no changes required. Cataract extraction Lumbar laminectomy Lumbar fusion foot fractures Tonsillectomy Melanoma lower left back "precancer"..............................................Marland KitchenGrubar  Family History: Reviewed history from 05/12/2008 and no changes required. Father died of cancer- testicular Family History of Prostate CA 1st degree relative <50 mother died of pancreatic cancer Family History of Arthritis  Social History: Reviewed history from 05/23/2008 and no changes required. Retired Married Pos smoking hx since age 58. had smoked up to 2 ppd. >  quit 04/2008 Alcohol use-yes Drug use-no Regular exercise-no  Review of Systems       The patient complains of vision loss.  The patient denies anorexia, fever, weight loss, weight gain, decreased hearing, hoarseness, chest pain, syncope, dyspnea on exertion, peripheral edema, prolonged cough, headaches, hemoptysis, abdominal pain, melena, hematochezia, severe indigestion/heartburn, hematuria, incontinence, genital sores, muscle weakness, suspicious skin lesions, transient blindness, difficulty walking, depression, unusual weight change, abnormal bleeding, enlarged lymph nodes, angioedema, and breast masses.    Physical Exam  General:  alert, cachetic, and pale.   Head:  atraumatic and male-pattern balding.   Eyes:  pupils equal and pupils reactive to light.   Ears:  R ear normal and L ear normal.   Nose:  External nasal examination shows no deformity or inflammation. Nasal mucosa are pink and moist without lesions or exudates. Mouth:  pharynx pink and moist and no erythema.   Neck:  No deformities, masses, or tenderness noted. Lungs:  normal respiratory effort and no wheezes.  Heart:  normal rate and regular rhythm.     Impression & Recommendations:  Problem # 1:  LUMBAR RADICULOPATHY, LEFT (ICD-724.4)  The following medications were removed from the medication list:    Avinza 60 Mg Xr24h-cap (Morphine sulfate beads) .Marland Kitchen... 1 once daily His updated medication list for this problem includes:    Celebrex 200 Mg Caps (Celecoxib) ..... One by mouth two times a day    Avinza 90 Mg Xr24h-cap (Morphine sulfate beads) ..... One by mouth daly  Discussed use of moist heat or ice, modified activities, medications, and stretching/strengthening exercises. Back care instructions given. To be seen in 2 weeks if no improvement; sooner if worsening of symptoms.   Problem # 2:  DM W/MANIFESTATION NEC, TYPE II, UNCONTROLLED (ICD-250.82)  Labs Reviewed: Creat: 1.4 (12/21/2008)    Reviewed HgBA1c  results: 5.7 (12/21/2008)  5.2 (07/05/2008)  Orders: TLB-BMP (Basic Metabolic Panel-BMET) (80048-METABOL) Venipuncture (36415) TLB-A1C / Hgb A1C (Glycohemoglobin) (83036-A1C)  Problem # 3:  CERUMEN IMPACTION, BILATERAL (ICD-380.4)  informed conset obtained, using a cerumin spoon the wax impaction was dislodged and the canal was lavaged with 1/2 peroxide and 1/2 warm water solution until clear  Orders: Cerumen Impaction Removal (16109)  Problem # 4:  GOUT (ICD-274.9)  Elevate extremity; warm compresses, symptomatic relief and medication as directed.   Orders: TLB-Uric Acid, Blood (84550-URIC)  Complete Medication List: 1)  Valium 5 Mg Tabs (Diazepam) .... One by mouth q hs 2)  Lexapro 10 Mg Tabs (Escitalopram oxalate) .Marland Kitchen.. 1 once daily 3)  Lyrica 75 Mg Caps (Pregabalin) .Marland Kitchen.. 1 once daily 4)  Celebrex 200 Mg Caps (Celecoxib) .... One by mouth two times a day 5)  Calcium 500/d 500-125 Mg-unit Tabs (Calcium carbonate-vitamin d) .... Once daily 6)  Multivitamins Tabs (Multiple vitamin) .... Once daily 7)  Fish Oil 1000 Mg Caps (Omega-3 fatty acids) .Marland Kitchen.. 1 once daily 8)  Avinza 90 Mg Xr24h-cap (Morphine sulfate beads) .... One by mouth daly Prescriptions: AVINZA 90 MG XR24H-CAP (MORPHINE SULFATE BEADS) one by mouth daly  #30 x 0   Entered and Authorized by:   Stacie Glaze MD   Signed by:   Stacie Glaze MD on 05/26/2009   Method used:   Print then Give to Patient   RxID:   [id number]

## 2010-02-13 NOTE — Progress Notes (Signed)
Summary: ER fu  Phone Note Call from Patient Call back at Home Phone [phone] Call back at [phone] her cell wants to talk to you specifically   Caller: Ricardo Allen Summary of Call: ER over weekend.  Tests.   See that note.  Couldn't stay awake.  No memory because of sleeping.  Was dehydrated.  Suggested I might need to cut Avinza.  Had 90mg  Avinza x3.  Gave this over weekend.  Also cut Lyrica to 1.  No more 90mg  Avinza.  Need Rx for that.  Leonie Douglas.   Initial call taken by: Rudy Jew, RN,  October 23, 2009 8:39 AM  Follow-up for Phone Call        agree still needs to push by mouth fluids cut avinza to 90 ( may produce rx for 30 ) and change lyrica to  on 75 mg at bed time moniter for scheduled dosing noting in chart do not exceed 90 mg on avinza for any reason  Follow-up by: Stacie Glaze MD,  October 23, 2009 8:54 AM    New/Updated Medications: AVINZA 90 MG XR24H-CAP (MORPHINE SULFATE BEADS) 1 once daily Prescriptions: AVINZA 90 MG XR24H-CAP (MORPHINE SULFATE BEADS) 1 once daily  #30 x 0   Entered by:   Willy Eddy, LPN   Authorized by:   Stacie Glaze MD   Signed by:   Willy Eddy, LPN on 47/82/9562   Method used:   Print then Give to Patient   RxID:   [id number]

## 2010-02-13 NOTE — Medication Information (Signed)
Summary: Order for Diabetic Supplies  Order for Diabetic Supplies   Imported By: Maryln Gottron 03/10/2009 09:15:13  _____________________________________________________________________  External Attachment:    Type:   Image     Comment:   External Document

## 2010-02-13 NOTE — Progress Notes (Signed)
Summary: insuline concerns  Phone Note Call from Patient   Caller: Spouse Summary of Call: patient is in the hospital for hip replacement.  the wife is now concerned because the hospital would like to give the patient insulin.  she states that dr Lovell Sheehan does not want him to have insulin.  any suggestions? Initial call taken by: Kern Reap CMA Duncan Dull),  July 07, 2009 12:54 PM  Follow-up for Phone Call        follow hospital instructions while hospitalized Follow-up by: Birdie Sons MD,  July 07, 2009 2:53 PM  Additional Follow-up for Phone Call Additional follow up Details #1::        Phone Call Completed Additional Follow-up by: Kern Reap CMA Duncan Dull),  July 07, 2009 6:02 PM

## 2010-02-13 NOTE — Progress Notes (Signed)
Summary: hospice?  Phone Note Call from Patient   Caller: Patient Call For: Stacie Glaze MD Summary of Call: Pt's wife states Dr. Darnelle Catalan suggested Dr. Lovell Sheehan call Hospice to help manage pain with Ricardo Allen.  She wants Dr. Jamie Brookes called, and would like Dr. Lovell Sheehan to call pt. Dr. Jamie Brookes:  [phone] Initial call taken by: Lynann Beaver CMA,  June 29, 2009 3:32 PM  Follow-up for Phone Call        hospice consultation requires DNR given by pt! although hospice will manage pain I have not been told that they will take some one who does not have a "terminal" diagnosis and is in paliative care only  that would change the care plan  and be communicated to HIM! I will turn over pain managment to pain clinic if hospice cannot manage him I will call them but "don't get your hopes up" I will call mary laroache today Follow-up by: Stacie Glaze MD,  June 30, 2009 10:30 AM  Additional Follow-up for Phone Call Additional follow up Details #1::        Left message for wife to call back. Additional Follow-up by: Lynann Beaver CMA,  June 30, 2009 11:22 AM  New Problems: TRAUMATIC ARTHROPATHY PELVIC REGION AND THIGH (ICD-716.15)   Additional Follow-up for Phone Call Additional follow up Details #2::    Pt's wife states they have been trying to get in to see Thyra Breed for 5 months, but would like Dr. Lovell Sheehan to refer him wherever he feels is best. Follow-up by: Lynann Beaver CMA,  June 30, 2009 12:45 PM  Additional Follow-up for Phone Call Additional follow up Details #3:: Details for Additional Follow-up Action Taken: refer either to [facility] or to  [facility]  [facility]per dr Lovell Sheehan  to dr [facility] for a referral Willy Eddy, LPN  July 03, 2009 9:21 AM  Referral for this pain rehab and for orthopedics sent to Referral Coordinator.  Additional Follow-up by: Stacie Glaze MD,  June 30, 2009 3:29 PM  New Problems: TRAUMATIC ARTHROPATHY  PELVIC REGION AND THIGH (ICD-716.15)

## 2010-02-13 NOTE — Progress Notes (Signed)
Summary: avinza rx  Phone Note From Pharmacy   Caller: [facility] Summary of Call: pharmacy is calling because Avinza is not available.  The pharmacy has called 3 other stores and they also do not carry Avinza.  Any suggestions?  Sheliah Plane [phone] Initial call taken by: Kern Reap CMA Duncan Dull),  March 22, 2009 2:10 PM    New/Updated Medications: AVINZA 60 MG XR24H-CAP (MORPHINE SULFATE BEADS) 1 once daily Prescriptions: AVINZA 60 MG XR24H-CAP (MORPHINE SULFATE BEADS) 1 once daily  #30 x 0   Entered by:   Willy Eddy, LPN   Authorized by:   Stacie Glaze MD   Signed by:   Willy Eddy, LPN on 45/40/9811   Method used:   Print then Give to Patient   RxID:   [id number]   Appended Document: avinza rx per dr Arva Chafe to avinza 60--faxed and mailed

## 2010-02-13 NOTE — Assessment & Plan Note (Signed)
Summary: 2 wk rov/njr   Vital Signs:  Patient profile:   77 year old male Height:      73 inches Weight:      172 pounds BMI:     22.77 Temp:     98.2 degrees F oral Pulse rate:   96 / minute Resp:     14 per minute BP sitting:   140 / 76  (left arm)  Vitals Entered By: Willy Eddy, LPN (March 23, 979 11:52 AM) CC: roa- states morphine was very effective at first , but has become less effective the longer he takes- states he is falling asleep easily even in the m iddle of meal and now is fearful of driving   Primary Care Provider:  Dr. Darryll Capers  CC:  roa- states morphine was very effective at first  and but has become less effective the longer he takes- states he is falling asleep easily even in the m iddle of meal and now is fearful of driving.  History of Present Illness: The duragesic side effect are gone the give some pain control that he rates in the 4-5 range and has been able to control most  meds two times a day the pt has stopped the alcohol wth the medications and tis has helped but hs definatelu been sedated with visible efects and audible slurirng. Wife confirms this. He has less appetite as well  Preventive Screening-Counseling & Management  Alcohol-Tobacco     Alcohol drinks/day: <1     Smoking Status: quit     Smoking Cessation Counseling: yes     Packs/Day: 1-2 packs per day     Year Started: 1960     Year Quit: september 2010     Passive Smoke Exposure: no  Current Problems (verified): 1)  Nausea  (ICD-787.02) 2)  Sleepiness  (ICD-780.09) 3)  Lung Nodule  (ICD-518.89) 4)  Abdominal Pain, Epigastric  (ICD-789.06) 5)  Weight Loss, Abnormal  (ICD-783.21) 6)  Chills Without Fever  (ICD-780.64) 7)  Tobacco Abuse  (ICD-305.1) 8)  Observation For Suspected Malignant Neoplasm  (ICD-V71.1) 9)  Accidental Falls, Recurrent  (ICD-E888.9) 10)  Low Back Pain  (ICD-724.2) 11)  Impaired Fasting Glucose  (ICD-790.21) 12)  Lumbar Radiculopathy, Left   (ICD-724.4) 13)  Hyperlipidemia  (ICD-272.4) 14)  Dm W/manifestation Nec, Type II, Uncontrolled  (ICD-250.82) 15)  Depression  (ICD-311) 16)  Dementia  (ICD-294.8) 17)  Obstructive Sleep Apnea  (ICD-327.23) 18)  Melanoma, Malignant, Trunk  (ICD-172.5) 19)  Anemia Nos  (ICD-285.9) 20)  Disorder, Lumbar Disc W/myelopathy  (ICD-722.73)  Current Medications (verified): 1)  Valium 5 Mg  Tabs (Diazepam) .... One By Mouth Q Hs 2)  Lexapro 10 Mg  Tabs (Escitalopram Oxalate) .Marland Kitchen.. 1 Once Daily 3)  Lyrica 75 Mg  Caps (Pregabalin) .Marland Kitchen.. 1 Once Daily 4)  Celebrex 200 Mg  Caps (Celecoxib) .... One By Mouth Two Times A Day 5)  Calcium 500/d 500-125 Mg-Unit  Tabs (Calcium Carbonate-Vitamin D) .... Once Daily 6)  Multivitamins   Tabs (Multiple Vitamin) .... Once Daily 7)  Lorazepam 0.5 Mg Tabs (Lorazepam) .... One By Mouth Bid 8)  Fish Oil 1000 Mg Caps (Omega-3 Fatty Acids) .Marland Kitchen.. 1 Once Daily 9)  Morphine Sulfate Cr 30 Mg Xr12h-Tab (Morphine Sulfate) .... One By Mouth Two Times A Day  Allergies (verified): 1)  ! Sulfa 2)  * Dilaudid 3)  Fentanyl (Fentanyl)  Past History:  Family History: Last updated: Apr 30, 2008 Father died of cancer- testicular  Family History of Prostate CA 1st degree relative <50 mother died of pancreatic cancer Family History of Arthritis  Social History: Last updated: 05/23/2008 Retired Married Pos smoking hx since age 66. had smoked up to 2 ppd. > quit 04/2008 Alcohol use-yes Drug use-no Regular exercise-no  Risk Factors: Alcohol Use: <1 (03/22/2009) Exercise: no (08/27/2006)  Risk Factors: Smoking Status: quit (03/22/2009) Packs/Day: 1-2 packs per day (03/22/2009) Passive Smoke Exposure: no (03/22/2009)  Past medical, surgical, family and social histories (including risk factors) reviewed, and no changes noted (except as noted below).  Past Medical History: Reviewed history from 05/23/2008 and no changes required. sleep apnea Dementia Depression Diabetes  mellitus, type II Hyperlipidemia Low back pain ASBESTOSIS     -  PFT's 05/23/08 FEV1 2.61 ( 90%) ratio 77   no improvment after B2, nl DLC0  Past Surgical History: Reviewed history from 04/14/2008 and no changes required. Cataract extraction Lumbar laminectomy Lumbar fusion foot fractures Tonsillectomy Melanoma lower left back "precancer"..............................................Marland KitchenGrubar  Family History: Reviewed history from 04/14/2008 and no changes required. Father died of cancer- testicular Family History of Prostate CA 1st degree relative <50 mother died of pancreatic cancer Family History of Arthritis  Social History: Reviewed history from 05/23/2008 and no changes required. Retired Married Pos smoking hx since age 16. had smoked up to 2 ppd. > quit 04/2008 Alcohol use-yes Drug use-no Regular exercise-no  Review of Systems  The patient denies anorexia, fever, weight loss, weight gain, vision loss, decreased hearing, hoarseness, chest pain, syncope, dyspnea on exertion, peripheral edema, prolonged cough, headaches, hemoptysis, abdominal pain, melena, hematochezia, severe indigestion/heartburn, hematuria, incontinence, genital sores, muscle weakness, suspicious skin lesions, transient blindness, difficulty walking, depression, unusual weight change, abnormal bleeding, enlarged lymph nodes, angioedema, and breast masses.    Physical Exam  General:  alert, cachetic, and pale.   Head:  atraumatic and male-pattern balding.   Eyes:  pupils equal and pupils reactive to light.   Ears:  R ear normal and L ear normal.   Nose:  External nasal examination shows no deformity or inflammation. Nasal mucosa are pink and moist without lesions or exudates. Mouth:  pharynx pink and moist and no erythema.   Neck:  No deformities, masses, or tenderness noted. Lungs:  increased bronchial bresth sounds, no rales slight end exp wheezing Heart:  normal rate and regular rhythm.   Abdomen:   soft and non-tender.   Msk:  No deformity or scoliosis noted of thoracic or lumbar spine.   Pulses:  R and L carotid,radial,femoral,dorsalis pedis and posterior tibial pulses are full and equal bilaterally Extremities:  No clubbing, cyanosis, edema, or deformity noted with normal full range of motion of all joints.   Neurologic:  No cranial nerve deficits noted. Station and gait are normal. Plantar reflexes are down-going bilaterally. DTRs are symmetrical throughout. Sensory, motor and coordinative functions appear intact.   Impression & Recommendations:  Problem # 1:  LOW BACK PAIN (ICD-724.2) Assessment Unchanged  His updated medication list for this problem includes:    Celebrex 200 Mg Caps (Celecoxib) ..... One by mouth two times a day    Avinza 75 Mg Xr24h-cap (Morphine sulfate beads) ..... One by mouth daily  Discussed use of moist heat or ice, modified activities, medications, and stretching/strengthening exercises. Back care instructions given. To be seen in 2 weeks if no improvement; sooner if worsening of symptoms.   Problem # 2:  GOUT (ICD-274.9) Assessment: Unchanged  Informed consent obtained and then the joint was prepped in a  sterile manor and 40 mg depo and 1/2 cc 1% lidocaine injected into the synovial space. After care discussed. Pt tolerated procedure well.  Elevate extremity; warm compresses, symptomatic relief and medication as directed.   Orders: Venipuncture (13244) TLB-Uric Acid, Blood (84550-URIC)  Complete Medication List: 1)  Valium 5 Mg Tabs (Diazepam) .... One by mouth q hs 2)  Lexapro 10 Mg Tabs (Escitalopram oxalate) .Marland Kitchen.. 1 once daily 3)  Lyrica 75 Mg Caps (Pregabalin) .Marland Kitchen.. 1 once daily 4)  Celebrex 200 Mg Caps (Celecoxib) .... One by mouth two times a day 5)  Calcium 500/d 500-125 Mg-unit Tabs (Calcium carbonate-vitamin d) .... Once daily 6)  Multivitamins Tabs (Multiple vitamin) .... Once daily 7)  Lorazepam 0.5 Mg Tabs (Lorazepam) .... One by  mouth bid 8)  Fish Oil 1000 Mg Caps (Omega-3 fatty acids) .Marland Kitchen.. 1 once daily 9)  Avinza 75 Mg Xr24h-cap (Morphine sulfate beads) .... One by mouth daily  Patient Instructions: 1)  Please schedule a follow-up appointment in 1 month. Prescriptions: AVINZA 75 MG XR24H-CAP (MORPHINE SULFATE BEADS) one by mouth daily  #30 x 0   Entered and Authorized by:   Stacie Glaze MD   Signed by:   Stacie Glaze MD on 03/22/2009   Method used:   Print then Give to Patient   RxID:   [id number]

## 2010-02-13 NOTE — Progress Notes (Signed)
Summary: stopped Fentanyl  Phone Note Call from Patient   Caller: Dad Call For: Stacie Glaze MD Reason for Call: Acute Illness Summary of Call: Pt will not use the Fentanyl patches of any strength.  States they make him too nauseated. Wife is very concerned about withdrawal.  [phone] Initial call taken by: Lynann Beaver CMA,  March 07, 2009 3:49 PM  Follow-up for Phone Call        left message on machine for wife for ov tomorrow Follow-up by: Willy Eddy, LPN,  March 07, 2009 4:37 PM

## 2010-02-13 NOTE — Progress Notes (Signed)
Summary: Call A Nurse   Call-A-Nurse Triage Call Report Triage Record Num: [id number] Operator: Remonia Richter Patient Name: Ricardo Allen Call Date & Time: 10/21/2009 6:09:05PM Patient Phone: [phone] PCP: Patient Gender: Male PCP Fax : Patient DOB: October 13, 1925 Practice Name: [facility] Reason for Call: wife/Barbara:husband taken from [facility] to ER last night, because he could not stay conscious, not making sense when he talked, ER said he was dehydrated, sent home told to drink and eat more which he will not do, wife is calling saying that he is taking 120 mg. Avinza (morphine for pain in his back), also taking Lyrica. Wife thinks he is overmedicated because he is staring at ceiling, cannot remember what he doing , asking about changing medication doses due to lethargy, See in 4 hours, ER disposition obtained, wife wants to hold PM Lyrica, asking if this is" safe", worried about s/e from holding medications, encouraged to discuss specific s/e concerns with pharmacist, agrees.Encouraged to go to ER for worsening s/s per guideline Protocol(s) Used: Dizziness or Vertigo Recommended Outcome per Protocol: See Provider within 4 hours Reason for Outcome: Having sensations of turning or spinning that affects balance AND not responsive to 4 hours of home care Care Advice: Call EMS 911 if patient develops confusion, decreased level of consciousness, chest pain, shortness of breath, or focal neurologic abnormalities such as facial droop or weakness of one extremity.  ~  ~ DO NOT drive until condition evaluated.  ~ Call provider if symptoms worsen or new symptoms develop. Avoid caffeine (coffee, tea, cola drinks, or chocolate), alcohol, and nicotine (use of tobacco), as use of these substances may worsen symptoms.  ~  ~ SYMPTOM / CONDITION MANAGEMENT  ~ CAUTIONS  ~ List, or take, all current prescription(s), nonprescription or alternative medication(s) to provider for  evaluation. Consider taking nonprescription motion sickness medication (Antivert, Dramamine) according to package or pharmacist's directions.  ~  ~ Lorenz Coaster still in a dimly lit room and avoid any sudden change in position. 10/21/2009 6:21:50PM Page 1 of 1 [email or web address]

## 2010-02-13 NOTE — Progress Notes (Signed)
Summary: questions  Phone Note Call from Patient   Caller: Patient Call For: Stacie Glaze MD Summary of Call: Pt wants to know if he should continue to take Fe So4, and he has run out of Prednisone? [phone] Initial call taken by: Lynann Beaver CMA,  September 13, 2009 11:03 AM  Follow-up for Phone Call        yes continue the iron and he was to stop prednison after 5 days- Follow-up by: Willy Eddy, LPN,  September 13, 2009 12:15 PM  Additional Follow-up for Phone Call Additional follow up Details #1::        Phone Call Completed Additional Follow-up by: Rudy Jew, RN,  September 13, 2009 1:54 PM

## 2010-02-13 NOTE — Progress Notes (Signed)
Summary: low BP j pt  Phone Note From Other Clinic   Caller: libby Stamps, PT, [facility], [phone] Summary of Call: BP today 95/45.  No symptoms.   DC from hospital yesterday for syncope & anema, got a unit of blood.  Had him eat saltines & drink water.  To lay down until wife gets home.  Have BP cuff & will monitor.   Initial call taken by: Rudy Jew, RN,  September 20, 2009 2:19 PM  Follow-up for Phone Call        push gator aid moniter bp Follow-up by: Stacie Glaze MD,  September 21, 2009 9:45 AM  Additional Follow-up for Phone Call Additional follow up Details #1::        Mistaken BP yesterday....Marland KitchenMarland KitchenCuff was not correct. BP fine today with different cuff. Additional Follow-up by: Lynann Beaver CMA,  September 21, 2009 11:12 AM

## 2010-02-13 NOTE — Letter (Signed)
Summary: [facility]  [facility]   Imported By: Maryln Gottron 10/25/2009 14:26:32  _____________________________________________________________________  External Attachment:    Type:   Image     Comment:   External Document

## 2010-02-13 NOTE — Progress Notes (Signed)
Summary: REQ FOR RETURN CALL / CONCERNING MEDS  Phone Note Call from Patient   Caller: Patient's wife Britta Mccreedy)  [phone] Summary of Call: Pts wife called to schedule appt for pt... Pt d/c from [facility] today.... However, She is concerned about medications that her husband is taking...  She adv that he is going to run out of meds soon and she doesn't know what medications that Dr Lovell Sheehan had pt on v/s what [facility] put pt on during his stay..... I scheduled pt for an appt on 8/31 @ 3:15pm but pts wife said that pt will run out of meds before then but she is not sure what meds are "new" meds...Marland KitchenMarland Kitchen Pts wife is requesting a return call at your convenience to discuss same.  Initial call taken by: Debbra Riding,  September 01, 2009 11:42 AM  Follow-up for Phone Call        +Wife will bring list of meds from [facility] today. Follow-up by: Lynann Beaver CMA,  September 01, 2009 1:01 PM  Additional Follow-up for Phone Call Additional follow up Details #1::        appoiuntment for tuesday and will discuss meds ast that time Additional Follow-up by: Willy Eddy, LPN,  September 01, 2009 3:00 PM

## 2010-02-13 NOTE — Assessment & Plan Note (Signed)
Summary: f/u from [facility]   Vital Signs:  Patient profile:   77 year old male Height:      73 inches Weight:      159 pounds BMI:     21.05 Temp:     98.2 degrees F oral Pulse rate:   100 / minute Resp:     14 per minute BP sitting:   120 / 70  (left arm)  Vitals Entered By: Willy Eddy, LPN (September 05, 2009 10:37 AM) CC: roa- post rehab- lots of med changes Is Patient Diabetic? Yes Did you bring your meter with you today? No   Primary Care Provider:  Dr. Darryll Capers  CC:  roa- post rehab- lots of med changes.  History of Present Illness: had a hip replacement complications with the partial and  not with  THR pain has been significantly reduced in the hip  still has chronic back completed rehab at Nursing home rehab is coming to the house ( nurse and soon therapists) has a program establised with advanced Dr Cathlean Cower is the orthopedist Needs MAR review form the nursing home had been on the prednisone for an allergic reaction has had a severe UTI with AMS  Preventive Screening-Counseling & Management  Alcohol-Tobacco     Alcohol drinks/day: <1     Smoking Status: quit     Smoking Cessation Counseling: yes     Packs/Day: 1-2 packs per day     Year Started: 1958     Year Quit: september 2010     Passive Smoke Exposure: no  Problems Prior to Update: 1)  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) 2)  Cerumen Impaction, Bilateral  (ICD-380.4) 3)  Gout  (ICD-274.9) 4)  Nausea  (ICD-787.02) 5)  Sleepiness  (ICD-780.09) 6)  Lung Nodule  (ICD-518.89) 7)  Abdominal Pain, Epigastric  (ICD-789.06) 8)  Weight Loss, Abnormal  (ICD-783.21) 9)  Chills Without Fever  (ICD-780.64) 10)  Tobacco Abuse  (ICD-305.1) 11)  Observation For Suspected Malignant Neoplasm  (ICD-V71.1) 12)  Accidental Falls, Recurrent  (ICD-E888.9) 13)  Low Back Pain  (ICD-724.2) 14)  Impaired Fasting Glucose  (ICD-790.21) 15)  Lumbar Radiculopathy, Left   (ICD-724.4) 16)  Hyperlipidemia  (ICD-272.4) 17)  Dm W/manifestation Nec, Type II, Uncontrolled  (ICD-250.82) 18)  Depression  (ICD-311) 19)  Dementia  (ICD-294.8) 20)  Obstructive Sleep Apnea  (ICD-327.23) 21)  Melanoma, Malignant, Trunk  (ICD-172.5) 22)  Anemia Nos  (ICD-285.9) 23)  Disorder, Lumbar Disc W/myelopathy  (ICD-722.73)  Medications Prior to Update: 1)  Valium 5 Mg  Tabs (Diazepam) .... One By Mouth Q Hs 2)  Lexapro 10 Mg  Tabs (Escitalopram Oxalate) .Marland Kitchen.. 1 Once Daily 3)  Lyrica 75 Mg  Caps (Pregabalin) .Marland Kitchen.. 1 By Mouth Two Times A Day 4)  Celebrex 200 Mg  Caps (Celecoxib) .... One By Mouth Two Times A Day 5)  Calcium 500/d 500-125 Mg-Unit  Tabs (Calcium Carbonate-Vitamin D) .... Once Daily 6)  Multivitamins   Tabs (Multiple Vitamin) .... Once Daily 7)  Fish Oil 1000 Mg Caps (Omega-3 Fatty Acids) .Marland Kitchen.. 1 Once Daily 8)  Avinza 90 Mg Xr24h-Cap (Morphine Sulfate Beads) .... One By Mouth Antony Madura  Current Medications (verified): 1)  Lexapro 10 Mg  Tabs (Escitalopram Oxalate) .Marland Kitchen.. 1 Once Daily 2)  Lyrica 75 Mg  Caps (Pregabalin) .Marland Kitchen.. 1 By Mouth Two Times A Day 3)  Ms Contin 30 Mg Xr12h-Tab (Morphine Sulfate) .Marland Kitchen.. 1 Every 12 Hours 4)  Nu-Iron 150 Mg Caps (  Polysaccharide Iron Complex) .Marland Kitchen.. 1 Two Times A Day 5)  Miralax  Powd (Polyethylene Glycol 3350) .Marland Kitchen.. 1 Once Daily 6)  Prednisone 5 Mg Tabs (Prednisone) .Marland Kitchen.. 1 Once Daily 7)  Methocarbamol 500 Mg Tabs (Methocarbamol) .Marland Kitchen.. 1 Every 6 Hours As Needed 8)  Zofran 4 Mg Tabs (Ondansetron Hcl) .Marland Kitchen.. 1 Every 4-6 Hours As Needed Nausea 9)  Percocet 5-325 Mg Tabs (Oxycodone-Acetaminophen) .Marland Kitchen.. 1 Every 4 Hours As Needed Pain  Allergies (verified): 1)  ! Sulfa 2)  * Dilaudid 3)  Fentanyl (Fentanyl)  Past History:  Family History: Last updated: 04-18-2008 Father died of cancer- testicular Family History of Prostate CA 1st degree relative <50 mother died of pancreatic cancer Family History of Arthritis  Social History: Last updated:  05/23/2008 Retired Married Pos smoking hx since age 45. had smoked up to 2 ppd. > quit 04/2008 Alcohol use-yes Drug use-no Regular exercise-no  Risk Factors: Alcohol Use: <1 (09/05/2009) Exercise: no (08/27/2006)  Risk Factors: Smoking Status: quit (09/05/2009) Packs/Day: 1-2 packs per day (09/05/2009) Passive Smoke Exposure: no (09/05/2009)  Past medical, surgical, family and social histories (including risk factors) reviewed, and no changes noted (except as noted below).  Past Medical History: Reviewed history from 05/23/2008 and no changes required. sleep apnea Dementia Depression Diabetes mellitus, type II Hyperlipidemia Low back pain ASBESTOSIS     -  PFT's 05/23/08 FEV1 2.61 ( 90%) ratio 77   no improvment after B2, nl DLC0  Past Surgical History: Reviewed history from 18-Apr-2008 and no changes required. Cataract extraction Lumbar laminectomy Lumbar fusion foot fractures Tonsillectomy Melanoma lower left back "precancer"..............................................Marland KitchenGrubar  Family History: Reviewed history from 04-18-2008 and no changes required. Father died of cancer- testicular Family History of Prostate CA 1st degree relative <50 mother died of pancreatic cancer Family History of Arthritis  Social History: Reviewed history from 05/23/2008 and no changes required. Retired Married Pos smoking hx since age 66. had smoked up to 2 ppd. > quit 04/2008 Alcohol use-yes Drug use-no Regular exercise-no  Review of Systems       The patient complains of severe indigestion/heartburn and difficulty walking.  The patient denies anorexia, fever, weight loss, weight gain, vision loss, decreased hearing, hoarseness, chest pain, syncope, dyspnea on exertion, peripheral edema, prolonged cough, headaches, hemoptysis, abdominal pain, melena, hematochezia, hematuria, incontinence, genital sores, muscle weakness, suspicious skin lesions, transient blindness, depression, unusual  weight change, abnormal bleeding, enlarged lymph nodes, angioedema, and breast masses.     Impression & Recommendations:  Problem # 1:  OTHER SPECIFIED IRON DEFICIENCY ANEMIAS (ICD-280.8)  post op iron def anemia iwth transfusions reguired moniter CBC and iron today His updated medication list for this problem includes:    Nu-iron 150 Mg Caps (Polysaccharide iron complex) .Marland Kitchen... 1 by mouth daily  Hgb: 13.7 (03/22/2008)   Hct: 41.1 (03/22/2008)   Platelets: 212 (03/22/2008) RBC: 4.39 (03/22/2008)   RDW: 14.0 (03/22/2008)   WBC: 11.0 (03/22/2008) MCV: 93.7 (03/22/2008)   MCHC: 33.2 (03/22/2008) TSH: 1.67 (08/27/2006)  Orders: Venipuncture (82956) TLB-CBC Platelet - w/Differential (85025-CBCD) TLB-IBC Pnl (Iron/FE;Transferrin) (83550-IBC)  Problem # 2:  LOW BACK PAIN (ICD-724.2)  The following medications were removed from the medication list:    Celebrex 200 Mg Caps (Celecoxib) ..... One by mouth two times a day    Avinza 90 Mg Xr24h-cap (Morphine sulfate beads) ..... One by mouth daly His updated medication list for this problem includes:    Avinza 90 Mg Xr24h-cap (Morphine sulfate beads) ..... One by mouth daily this replaces all  other pain pill    Methocarbamol 500 Mg Tabs (Methocarbamol) .Marland Kitchen... 1 every 6 hours as needed muscle spasm  Discussed use of moist heat or ice, modified activities, medications, and stretching/strengthening exercises. Back care instructions given. To be seen in 2 weeks if no improvement; sooner if worsening of symptoms.   Problem # 3:  GOUT (ICD-274.9)  Elevate extremity; warm compresses, symptomatic relief and medication as directed.   His updated medication list for this problem includes:    Colcrys 0.6 Mg Tabs (Colchicine) ..... One by mouth daily for gout  Problem # 4:  DM W/MANIFESTATION NEC, TYPE II, UNCONTROLLED (ICD-250.82)  the pt needs A1c Labs Reviewed: Creat: 1.3 (05/26/2009)    Reviewed HgBA1c results: 5.3 (05/26/2009)  5.7  (12/21/2008)  Orders: TLB-BMP (Basic Metabolic Panel-BMET) (80048-METABOL) TLB-A1C / Hgb A1C (Glycohemoglobin) (83036-A1C)  Complete Medication List: 1)  Lexapro 10 Mg Tabs (Escitalopram oxalate) .Marland Kitchen.. 1 once daily 2)  Lyrica 75 Mg Caps (Pregabalin) .Marland Kitchen.. 1 by mouth two times a day for back pain as a regular schedule 3)  Avinza 90 Mg Xr24h-cap (Morphine sulfate beads) .... One by mouth daily this replaces all other pain pill 4)  Nu-iron 150 Mg Caps (Polysaccharide iron complex) .Marland Kitchen.. 1 by mouth daily 5)  Miralax Powd (Polyethylene glycol 3350) .Marland Kitchen.. 1 once daily 6)  Prednisone 5 Mg Tabs (Prednisone) .... 1/2 by mouth daily for 5 days then stop 7)  Methocarbamol 500 Mg Tabs (Methocarbamol) .Marland Kitchen.. 1 every 6 hours as needed muscle spasm 8)  Zofran 4 Mg Tabs (Ondansetron hcl) .Marland Kitchen.. 1 every 4-6 hours as needed for nausea 9)  Colcrys 0.6 Mg Tabs (Colchicine) .... One by mouth daily for gout 10)  Polyethylene Glycol 3350 Powd (Polyethylene glycol 3350) .Marland KitchenMarland KitchenMarland Kitchen 17 gram daily to keep stools soft if stoola are  are not daily may increase to twice a day  Patient Instructions: 1)  Please schedule a follow-up appointment in 1 month. Prescriptions: COLCRYS 0.6 MG TABS (COLCHICINE) one by mouth daily for gout  #30 x 11   Entered and Authorized by:   Stacie Glaze MD   Signed by:   Stacie Glaze MD on 09/05/2009   Method used:   Electronically to        [facility] (retail)       [address]       [place], [place]  [id number]       Ph: [phone] or [phone]       Fax: [phone]   RxID:   [id number] AVINZA 90 MG XR24H-CAP (MORPHINE SULFATE BEADS) one by mouth daily this replaces all other pain pill  #30 x 0   Entered and Authorized by:   Stacie Glaze MD   Signed by:   Stacie Glaze MD on 09/05/2009   Method used:   Print then Give to Patient   RxID:   [id number] AVINZA 90 MG XR24H-CAP (MORPHINE SULFATE BEADS) one by mouth daily this replaces all other pain pill  #30 x  0   Entered and Authorized by:   Stacie Glaze MD   Signed by:   Stacie Glaze MD on 09/05/2009   Method used:   Print then Give to Patient   RxID:   [id number]   Appended Document: Orders Update    Clinical Lists Changes  Orders: Added new Service order of Specimen Handling (60630) - Signed

## 2010-02-13 NOTE — Assessment & Plan Note (Signed)
Summary: 1 mnth rov//slm   Vital Signs:  Patient profile:   77 year old male Height:      73 inches Weight:      164 pounds BMI:     21.72 Temp:     98.2 degrees F oral Pulse rate:   92 / minute Resp:     14 per minute BP sitting:   140 / 70  (left arm) BP standing:   117 / 70  Vitals Entered By: Willy Eddy, LPN (October 31, 2009 4:27 PM) CC: roa- states avinza works well Is Patient Diabetic? Yes   Primary Care Provider:  Stacie Glaze MD  CC:  roa- states avinza works well.  History of Present Illness: has an appointment with Thyra Breed The family is ocncerned over the somolence The pt is not walking the pt has not "left the house" inactivity is an extreme problem CBGs are  between 120-130 fasting    Preventive Screening-Counseling & Management  Alcohol-Tobacco     Alcohol drinks/day: <1     Smoking Status: quit     Smoking Cessation Counseling: yes     Packs/Day: 1-2 packs per day     Year Started: 1958     Year Quit: september 2010     Passive Smoke Exposure: no     Tobacco Counseling: to remain off tobacco products  Problems Prior to Update: 1)  Hypotension, Orthostatic  (ICD-458.0) 2)  Other Specified Iron Deficiency Anemias  (ICD-280.8) 3)  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) 4)  Cerumen Impaction, Bilateral  (ICD-380.4) 5)  Gout  (ICD-274.9) 6)  Nausea  (ICD-787.02) 7)  Sleepiness  (ICD-780.09) 8)  Lung Nodule  (ICD-518.89) 9)  Abdominal Pain, Epigastric  (ICD-789.06) 10)  Weight Loss, Abnormal  (ICD-783.21) 11)  Chills Without Fever  (ICD-780.64) 12)  Tobacco Abuse  (ICD-305.1) 13)  Observation For Suspected Malignant Neoplasm  (ICD-V71.1) 14)  Accidental Falls, Recurrent  (ICD-E888.9) 15)  Low Back Pain  (ICD-724.2) 16)  Impaired Fasting Glucose  (ICD-790.21) 17)  Lumbar Radiculopathy, Left  (ICD-724.4) 18)  Hyperlipidemia  (ICD-272.4) 19)  Dm W/manifestation Nec, Type II, Uncontrolled  (ICD-250.82) 20)   Depression  (ICD-311) 21)  Dementia  (ICD-294.8) 22)  Obstructive Sleep Apnea  (ICD-327.23) 23)  Melanoma, Malignant, Trunk  (ICD-172.5) 24)  Anemia Nos  (ICD-285.9) 25)  Disorder, Lumbar Disc W/myelopathy  (ICD-722.73)  Current Problems (verified): 1)  Other Specified Iron Deficiency Anemias  (ICD-280.8) 2)  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) 3)  Cerumen Impaction, Bilateral  (ICD-380.4) 4)  Gout  (ICD-274.9) 5)  Nausea  (ICD-787.02) 6)  Sleepiness  (ICD-780.09) 7)  Lung Nodule  (ICD-518.89) 8)  Abdominal Pain, Epigastric  (ICD-789.06) 9)  Weight Loss, Abnormal  (ICD-783.21) 10)  Chills Without Fever  (ICD-780.64) 11)  Tobacco Abuse  (ICD-305.1) 12)  Observation For Suspected Malignant Neoplasm  (ICD-V71.1) 13)  Accidental Falls, Recurrent  (ICD-E888.9) 14)  Low Back Pain  (ICD-724.2) 15)  Impaired Fasting Glucose  (ICD-790.21) 16)  Lumbar Radiculopathy, Left  (ICD-724.4) 17)  Hyperlipidemia  (ICD-272.4) 18)  Dm W/manifestation Nec, Type II, Uncontrolled  (ICD-250.82) 19)  Depression  (ICD-311) 20)  Dementia  (ICD-294.8) 21)  Obstructive Sleep Apnea  (ICD-327.23) 22)  Melanoma, Malignant, Trunk  (ICD-172.5) 23)  Anemia Nos  (ICD-285.9) 24)  Disorder, Lumbar Disc W/myelopathy  (ICD-722.73)  Medications Prior to Update: 1)  Lexapro 10 Mg  Tabs (Escitalopram Oxalate) .Marland Kitchen.. 1 Once Daily 2)  Lyrica 75 Mg  Caps (Pregabalin) .Marland Kitchen.. 1 By Mouth Two Times A Day 3)  Avinza 90 Mg Xr24h-Cap (Morphine Sulfate Beads) .Marland Kitchen.. 1 Once Daily 4)  Nu-Iron 150 Mg Caps (Polysaccharide Iron Complex) .Marland Kitchen.. 1 By Mouth Daily 5)  Miralax  Powd (Polyethylene Glycol 3350) .Marland Kitchen.. 1 Once Daily 6)  Methocarbamol 500 Mg Tabs (Methocarbamol) .Marland Kitchen.. 1 Every 6 Hours As Needed Muscle Spasm 7)  Zofran 4 Mg Tabs (Ondansetron Hcl) .Marland Kitchen.. 1 Every 4-6 Hours As Needed For Nausea 8)  Colcrys 0.6 Mg Tabs (Colchicine) .... One By Mouth Daily For Gout 9)  Aspirin 81 Mg Tabs (Aspirin) .Marland Kitchen.. 1 Once Daily 10)   Multivitamins  Caps (Multiple Vitamin) .Marland Kitchen.. 1 Once Daily 11)  Vitamin C .... 1 Once Daily 12)  Vitamin D .... 1 Once Daily 13)  Diazepam 5 Mg Tabs (Diazepam) .Marland Kitchen.. 1qhs As Needed 14)  Tamsulosin Hcl 0.4 Mg Caps (Tamsulosin Hcl) .Marland Kitchen.. 1 Once Daily  Current Medications (verified): 1)  Lexapro 10 Mg  Tabs (Escitalopram Oxalate) .Marland Kitchen.. 1 Once Daily 2)  Lyrica 75 Mg  Caps (Pregabalin) .Marland Kitchen.. 1 By Mouth Q Hs 3)  Avinza 90 Mg Xr24h-Cap (Morphine Sulfate Beads) .Marland Kitchen.. 1 Once Daily 4)  Nu-Iron 150 Mg Caps (Polysaccharide Iron Complex) .Marland Kitchen.. 1 By Mouth Daily 5)  Miralax  Powd (Polyethylene Glycol 3350) .Marland Kitchen.. 1 Once Daily 6)  Methocarbamol 500 Mg Tabs (Methocarbamol) .Marland Kitchen.. 1 Every 6 Hours As Needed Muscle Spasm 7)  Zofran 4 Mg Tabs (Ondansetron Hcl) .Marland Kitchen.. 1 Every 4-6 Hours As Needed For Nausea 8)  Colcrys 0.6 Mg Tabs (Colchicine) .... One By Mouth Daily For Gout 9)  Aspirin 81 Mg Tabs (Aspirin) .Marland Kitchen.. 1 Once Daily 10)  Multivitamins  Caps (Multiple Vitamin) .Marland Kitchen.. 1 Once Daily 11)  Vitamin C .... 1 Once Daily 12)  Vitamin D .... 1 Once Daily 13)  Diazepam 5 Mg Tabs (Diazepam) .Marland Kitchen.. 1qhs As Needed 14)  Tamsulosin Hcl 0.4 Mg Caps (Tamsulosin Hcl) .Marland Kitchen.. 1 Once Daily  Allergies (verified): 1)  ! Sulfa 2)  * Dilaudid 3)  Fentanyl (Fentanyl)  Past History:  Family History: Last updated: 04/23/2008 Father died of cancer- testicular Family History of Prostate CA 1st degree relative <50 mother died of pancreatic cancer Family History of Arthritis  Social History: Last updated: 05/23/2008 Retired Married Pos smoking hx since age 38. had smoked up to 2 ppd. > quit 04/2008 Alcohol use-yes Drug use-no Regular exercise-no  Risk Factors: Alcohol Use: <1 (10/31/2009) Exercise: no (08/27/2006)  Risk Factors: Smoking Status: quit (10/31/2009) Packs/Day: 1-2 packs per day (10/31/2009) Passive Smoke Exposure: no (10/31/2009)  Past medical, surgical, family and social histories (including risk factors) reviewed,  and no changes noted (except as noted below).  Past Medical History: Reviewed history from 05/23/2008 and no changes required. sleep apnea Dementia Depression Diabetes mellitus, type II Hyperlipidemia Low back pain ASBESTOSIS     -  PFT's 05/23/08 FEV1 2.61 ( 90%) ratio 77   no improvment after B2, nl DLC0  Past Surgical History: Reviewed history from Apr 23, 2008 and no changes required. Cataract extraction Lumbar laminectomy Lumbar fusion foot fractures Tonsillectomy Melanoma lower left back "precancer"..............................................Marland KitchenGrubar  Family History: Reviewed history from 04/23/08 and no changes required. Father died of cancer- testicular Family History of Prostate CA 1st degree relative <50 mother died of pancreatic cancer Family History of Arthritis  Social History: Reviewed history from 05/23/2008 and no changes required. Retired Married Pos smoking hx since age 26. had smoked up to 2 ppd. >  quit 04/2008 Alcohol use-yes Drug use-no Regular exercise-no  Review of Systems  The patient denies anorexia, fever, weight loss, weight gain, vision loss, decreased hearing, hoarseness, chest pain, syncope, dyspnea on exertion, peripheral edema, prolonged cough, headaches, hemoptysis, abdominal pain, melena, hematochezia, severe indigestion/heartburn, hematuria, incontinence, genital sores, muscle weakness, suspicious skin lesions, transient blindness, difficulty walking, depression, unusual weight change, abnormal bleeding, enlarged lymph nodes, angioedema, breast masses, and testicular masses.    Physical Exam  General:  alert, cachetic, and pale.   Head:  atraumatic and male-pattern balding.   Eyes:  pupils equal and pupils reactive to light.   Ears:  R ear normal and L ear normal.   Nose:  External nasal examination shows no deformity or inflammation. Nasal mucosa are pink and moist without lesions or exudates. Neck:  No deformities, masses, or  tenderness noted. Lungs:  normal respiratory effort and no wheezes.   Heart:  normal rate and regular rhythm.   Abdomen:  soft, no guarding, no rigidity, and bowel sounds hypoactive.   Msk:  no joint warmth and no redness over joints.   Neurologic:  abnormal gait.   slow responses    Impression & Recommendations:  Problem # 1:  IMPAIRED FASTING GLUCOSE (ICD-790.21) reviewed diet and need for frequent small meals discussed role of hypoglycemia and fall risks Labs Reviewed: Creat: 1.2 (09/05/2009)     Problem # 2:  ACCIDENTAL FALLS, RECURRENT (ICD-E888.9) decrease the lyrica to 75 my at bedtme needs to try to get 1000 steps a day to hep prevent falls  Problem # 3:  WEIGHT LOSS, ABNORMAL (ICD-783.21)  Problem # 4:  HYPOTENSION, ORTHOSTATIC (ICD-458.0)  the pt needs to increased by mouth intake and water do not take nonsteroid may use tylenol  Orders: Specimen Handling (81191) TLB-CBC Platelet - w/Differential (85025-CBCD)  Complete Medication List: 1)  Lexapro 10 Mg Tabs (Escitalopram oxalate) .Marland Kitchen.. 1 once daily 2)  Lyrica 75 Mg Caps (Pregabalin) .Marland Kitchen.. 1 by mouth q hs 3)  Avinza 90 Mg Xr24h-cap (Morphine sulfate beads) .Marland Kitchen.. 1 once daily 4)  Nu-iron 150 Mg Caps (Polysaccharide iron complex) .Marland Kitchen.. 1 by mouth daily 5)  Miralax Powd (Polyethylene glycol 3350) .Marland Kitchen.. 1 once daily 6)  Methocarbamol 500 Mg Tabs (Methocarbamol) .Marland Kitchen.. 1 every 6 hours as needed muscle spasm 7)  Zofran 4 Mg Tabs (Ondansetron hcl) .Marland Kitchen.. 1 every 4-6 hours as needed for nausea 8)  Colcrys 0.6 Mg Tabs (Colchicine) .... One by mouth daily for gout 9)  Aspirin 81 Mg Tabs (Aspirin) .Marland Kitchen.. 1 once daily 10)  Multivitamins Caps (Multiple vitamin) .Marland Kitchen.. 1 once daily 11)  Vitamin C  .... 1 once daily 12)  Vitamin D  .... 1 once daily 13)  Diazepam 5 Mg Tabs (Diazepam) .Marland Kitchen.. 1qhs as needed 14)  Tamsulosin Hcl 0.4 Mg Caps (Tamsulosin hcl) .Marland Kitchen.. 1 once daily  Other Orders: Venipuncture (47829) TLB-BMP (Basic Metabolic  Panel-BMET) (80048-METABOL) TLB-A1C / Hgb A1C (Glycohemoglobin) (83036-A1C)  Patient Instructions: 1)   every day walk without  dog but may walk with wife and dogs 2)  walk to the [place] and back. 3)  Three meals and at least three glasses of water 4)  keep appointment in NOv   Orders Added: 1)  Est. Patient Level IV [56213] 2)  Venipuncture [08657] 3)  Specimen Handling [99000] 4)  TLB-BMP (Basic Metabolic Panel-BMET) [80048-METABOL] 5)  TLB-A1C / Hgb A1C (Glycohemoglobin) [83036-A1C] 6)  TLB-CBC Platelet - w/Differential [85025-CBCD]

## 2010-02-13 NOTE — Progress Notes (Signed)
Summary: thigh trauma  Phone Note Call from Patient   Caller: Patient Call For: Stacie Glaze MD Summary of Call: Pt fell Friday and scraped knee, but is having more pain in his thigh,and using an walker.  No increased hip or back pain.  Was hoping it was going to get better by today, but is still in pain.  Right extremity. [phone] Initial call taken by: Lynann Beaver CMA,  July 03, 2009 9:13 AM  Follow-up for Phone Call        per dr Lovell Sheehan- to orthopedist Follow-up by: Willy Eddy, LPN,  July 03, 2009 9:20 AM  Additional Follow-up for Phone Call Additional follow up Details #1::        Sent to referral coordinator. Additional Follow-up by: Lynann Beaver CMA,  July 03, 2009 10:28 AM

## 2010-02-13 NOTE — Assessment & Plan Note (Signed)
Summary: 2 MNTH ROV//SLM   Vital Signs:  Patient profile:   77 year old male Height:      73 inches Weight:      168 pounds BMI:     22.25 Temp:     98.2 degrees F oral Pulse rate:   80 / minute Resp:     14 per minute BP sitting:   154 / 80  (left arm)  Vitals Entered By: Willy Eddy, LPN (February 21, 2009 10:46 AM) CC: roa- c/o nause and sweating- ?ing if coming from fentanyl patches   Primary Care Provider:  Dr. Darryll Capers  CC:  roa- c/o nause and sweating- ?ing if coming from fentanyl patches.  History of Present Illness: Pt has been on fentanyl for pain control and has mild nausea and hot flashes for  the patchesThis is the first month at this dose has an appointment with the neurosurgeon DM is fair control with CBG's in the 120 range    Preventive Screening-Counseling & Management  Alcohol-Tobacco     Alcohol drinks/day: <1     Smoking Status: quit     Smoking Cessation Counseling: yes     Packs/Day: 1-2 packs per day     Year Started: 1958     Year Quit: september 2010     Passive Smoke Exposure: no  Problems Prior to Update: 1)  Sleepiness  (ICD-780.09) 2)  Lung Nodule  (ICD-518.89) 3)  Abdominal Pain, Epigastric  (ICD-789.06) 4)  Weight Loss, Abnormal  (ICD-783.21) 5)  Chills Without Fever  (ICD-780.64) 6)  Tobacco Abuse  (ICD-305.1) 7)  Observation For Suspected Malignant Neoplasm  (ICD-V71.1) 8)  Accidental Falls, Recurrent  (ICD-E888.9) 9)  Low Back Pain  (ICD-724.2) 10)  Impaired Fasting Glucose  (ICD-790.21) 11)  Lumbar Radiculopathy, Left  (ICD-724.4) 12)  Hyperlipidemia  (ICD-272.4) 13)  Dm W/manifestation Nec, Type II, Uncontrolled  (ICD-250.82) 14)  Depression  (ICD-311) 15)  Dementia  (ICD-294.8) 16)  Obstructive Sleep Apnea  (ICD-327.23) 17)  Melanoma, Malignant, Trunk  (ICD-172.5) 18)  Anemia Nos  (ICD-285.9) 19)  Disorder, Lumbar Disc W/myelopathy  (ICD-722.73)  Current Problems (verified): 1)  Sleepiness  (ICD-780.09) 2)   Lung Nodule  (ICD-518.89) 3)  Abdominal Pain, Epigastric  (ICD-789.06) 4)  Weight Loss, Abnormal  (ICD-783.21) 5)  Chills Without Fever  (ICD-780.64) 6)  Tobacco Abuse  (ICD-305.1) 7)  Observation For Suspected Malignant Neoplasm  (ICD-V71.1) 8)  Accidental Falls, Recurrent  (ICD-E888.9) 9)  Low Back Pain  (ICD-724.2) 10)  Impaired Fasting Glucose  (ICD-790.21) 11)  Lumbar Radiculopathy, Left  (ICD-724.4) 12)  Hyperlipidemia  (ICD-272.4) 13)  Dm W/manifestation Nec, Type II, Uncontrolled  (ICD-250.82) 14)  Depression  (ICD-311) 15)  Dementia  (ICD-294.8) 16)  Obstructive Sleep Apnea  (ICD-327.23) 17)  Melanoma, Malignant, Trunk  (ICD-172.5) 18)  Anemia Nos  (ICD-285.9) 19)  Disorder, Lumbar Disc W/myelopathy  (ICD-722.73)  Medications Prior to Update: 1)  Valium 5 Mg  Tabs (Diazepam) .... One By Mouth Q Hs 2)  Lexapro 10 Mg  Tabs (Escitalopram Oxalate) .Marland Kitchen.. 1 Once Daily 3)  Lyrica 75 Mg  Caps (Pregabalin) .Marland Kitchen.. 1 Once Daily 4)  Celebrex 200 Mg  Caps (Celecoxib) .... One By Mouth Two Times A Day 5)  Calcium 500/d 500-125 Mg-Unit  Tabs (Calcium Carbonate-Vitamin D) .... Once Daily 6)  Multivitamins   Tabs (Multiple Vitamin) .... Once Daily 7)  Lorazepam 0.5 Mg Tabs (Lorazepam) .... One By Mouth Bid 8)  Fish Oil 1000  Mg Caps (Omega-3 Fatty Acids) .Marland Kitchen.. 1 Once Daily 9)  Fentanyl 100 Mcg/hr Pt72 (Fentanyl) .... Change Patch Every 72 Hours  Current Medications (verified): 1)  Valium 5 Mg  Tabs (Diazepam) .... One By Mouth Q Hs 2)  Lexapro 10 Mg  Tabs (Escitalopram Oxalate) .Marland Kitchen.. 1 Once Daily 3)  Lyrica 75 Mg  Caps (Pregabalin) .Marland Kitchen.. 1 Once Daily 4)  Celebrex 200 Mg  Caps (Celecoxib) .... One By Mouth Two Times A Day 5)  Calcium 500/d 500-125 Mg-Unit  Tabs (Calcium Carbonate-Vitamin D) .... Once Daily 6)  Multivitamins   Tabs (Multiple Vitamin) .... Once Daily 7)  Lorazepam 0.5 Mg Tabs (Lorazepam) .... One By Mouth Bid 8)  Fish Oil 1000 Mg Caps (Omega-3 Fatty Acids) .Marland Kitchen.. 1 Once Daily 9)   Fentanyl 100 Mcg/hr Pt72 (Fentanyl) .... Change Patch Every 72 Hours  Allergies (verified): 1)  ! Sulfa  Past History:  Family History: Last updated: 05-11-2008 Father died of cancer- testicular Family History of Prostate CA 1st degree relative <50 mother died of pancreatic cancer Family History of Arthritis  Social History: Last updated: 05/23/2008 Retired Married Pos smoking hx since age 30. had smoked up to 2 ppd. > quit 04/2008 Alcohol use-yes Drug use-no Regular exercise-no  Risk Factors: Alcohol Use: <1 (02/21/2009) Exercise: no (08/27/2006)  Risk Factors: Smoking Status: quit (02/21/2009) Packs/Day: 1-2 packs per day (02/21/2009) Passive Smoke Exposure: no (02/21/2009)  Past medical, surgical, family and social histories (including risk factors) reviewed, and no changes noted (except as noted below).  Past Medical History: Reviewed history from 05/23/2008 and no changes required. sleep apnea Dementia Depression Diabetes mellitus, type II Hyperlipidemia Low back pain ASBESTOSIS     -  PFT's 05/23/08 FEV1 2.61 ( 90%) ratio 77   no improvment after B2, nl DLC0  Past Surgical History: Reviewed history from 2008-05-11 and no changes required. Cataract extraction Lumbar laminectomy Lumbar fusion foot fractures Tonsillectomy Melanoma lower left back "precancer"..............................................Marland KitchenGrubar  Family History: Reviewed history from 11-May-2008 and no changes required. Father died of cancer- testicular Family History of Prostate CA 1st degree relative <50 mother died of pancreatic cancer Family History of Arthritis  Social History: Reviewed history from 05/23/2008 and no changes required. Retired Married Pos smoking hx since age 62. had smoked up to 2 ppd. > quit 04/2008 Alcohol use-yes Drug use-no Regular exercise-no  Review of Systems  The patient denies anorexia, fever, weight loss, weight gain, vision loss, decreased hearing,  hoarseness, chest pain, syncope, dyspnea on exertion, peripheral edema, prolonged cough, headaches, hemoptysis, abdominal pain, melena, hematochezia, severe indigestion/heartburn, hematuria, incontinence, genital sores, muscle weakness, suspicious skin lesions, transient blindness, difficulty walking, depression, unusual weight change, abnormal bleeding, enlarged lymph nodes, angioedema, and breast masses.    Physical Exam  General:  alert, cachetic, and pale.   Head:  atraumatic and male-pattern balding.   Eyes:  pupils equal and pupils reactive to light.   Ears:  R ear normal and L ear normal.   Nose:  External nasal examination shows no deformity or inflammation. Nasal mucosa are pink and moist without lesions or exudates. Mouth:  pharynx pink and moist and no erythema.   Neck:  No deformities, masses, or tenderness noted. Lungs:  increased bronchial bresth sounds, no rales slight end exp wheezing Heart:  normal rate and regular rhythm.   Abdomen:  soft and non-tender.   Msk:  lumbar lordosis and SI joint tenderness.   Pulses:  R and L carotid,radial,femoral,dorsalis pedis and posterior tibial pulses are  full and equal bilaterally Extremities:  1+ left pedal edema and trace right pedal edema.   Neurologic:  alert & oriented X3 and abnormal gait.     Impression & Recommendations:  Problem # 1:  LUMBAR RADICULOPATHY, LEFT (ICD-724.4)  seeing the neurosurgeon His updated medication list for this problem includes:    Celebrex 200 Mg Caps (Celecoxib) ..... One by mouth two times a day    Fentanyl 100 Mcg/hr Pt72 (Fentanyl) .Marland Kitchen... Change patch every 72 hours  Discussed use of moist heat or ice, modified activities, medications, and stretching/strengthening exercises. Back care instructions given. To be seen in 2 weeks if no improvement; sooner if worsening of symptoms.   Problem # 2:  TOBACCO ABUSE (ICD-305.1)  Encouraged smoking cessation and discussed different methods for smoking  cessation.   Problem # 3:  DM W/MANIFESTATION NEC, TYPE II, UNCONTROLLED (ICD-250.82)  Labs Reviewed: Creat: 1.4 (12/21/2008)    Reviewed HgBA1c results: 5.7 (12/21/2008)  5.2 (07/05/2008)  Problem # 4:  HYPERLIPIDEMIA (ICD-272.4)  Labs Reviewed: SGOT: 23 (03/24/2008)   SGPT: 15 (03/24/2008)  Lipid Goals: Chol Goal: 200 (10/09/2006)   HDL Goal: 40 (10/09/2006)   LDL Goal: 100 (10/09/2006)   TG Goal: 150 (10/09/2006)  Prior 10 Yr Risk Heart Disease: Not enough information (10/09/2006)   HDL:51.0 (03/24/2008), 42.1 (01/27/2007)  LDL:DEL (03/24/2008), DEL (01/27/2007)  Chol:211 (03/24/2008), 222 (01/27/2007)  Trig:82 (03/24/2008), 107 (01/27/2007)  Complete Medication List: 1)  Valium 5 Mg Tabs (Diazepam) .... One by mouth q hs 2)  Lexapro 10 Mg Tabs (Escitalopram oxalate) .Marland Kitchen.. 1 once daily 3)  Lyrica 75 Mg Caps (Pregabalin) .Marland Kitchen.. 1 once daily 4)  Celebrex 200 Mg Caps (Celecoxib) .... One by mouth two times a day 5)  Calcium 500/d 500-125 Mg-unit Tabs (Calcium carbonate-vitamin d) .... Once daily 6)  Multivitamins Tabs (Multiple vitamin) .... Once daily 7)  Lorazepam 0.5 Mg Tabs (Lorazepam) .... One by mouth bid 8)  Fish Oil 1000 Mg Caps (Omega-3 fatty acids) .Marland Kitchen.. 1 once daily 9)  Fentanyl 100 Mcg/hr Pt72 (Fentanyl) .... Change patch every 72 hours  Patient Instructions: 1)  Please schedule a follow-up appointment in 2 months. Prescriptions: FENTANYL 100 MCG/HR PT72 (FENTANYL) change patch every 72 hours  #2 boxes x 0   Entered by:   Willy Eddy, LPN   Authorized by:   Stacie Glaze MD   Signed by:   Willy Eddy, LPN on 78/29/5621   Method used:   Print then Give to Patient   RxID:   [id number]

## 2010-02-13 NOTE — Progress Notes (Signed)
Summary: holter monitor  Phone Note Outgoing Call   Call placed by: Marcos Eke,  November 28, 2009 1:37 PM Action Taken: Appt scheduled Summary of Call: patient has appt for friday at4 pm for monitor Initial call taken by: Marcos Eke,  November 28, 2009 1:37 PM

## 2010-02-13 NOTE — Letter (Signed)
Summary: [facility]  [facility]   Imported By: Maryln Gottron 05/08/2009 13:25:57  _____________________________________________________________________  External Attachment:    Type:   Image     Comment:   External Document

## 2010-02-15 NOTE — Assessment & Plan Note (Signed)
Summary: 2 week fup//ccm   Vital Signs:  Patient profile:   77 year old male Height:      73 inches Weight:      166 pounds BMI:     21.98 Temp:     98.2 degrees F oral Pulse rate:   112 / minute Resp:     14 per minute BP sitting:   144 / 60  (left arm)  Vitals Entered By: Willy Eddy, LPN (December 13, 2009 2:15 PM) CC: roa, Lipid Management Is Patient Diabetic? No   Primary Care Provider:  Stacie Glaze MD  CC:  roa and Lipid Management.  History of Present Illness: the results of the holter showed NSR the pt has been on the cardiazem for possible PAF but the holter was normal ( 48 hour) he has noted hypotension results of holter reviewed  Lipid Management History:      Positive NCEP/ATP III risk factors include male age 59 years old or older, diabetes, current tobacco user, and hypertension.  Negative NCEP/ATP III risk factors include no family history for ischemic heart disease, no ASHD (atherosclerotic heart disease), no prior stroke/TIA, no peripheral vascular disease, and no history of aortic aneurysm.     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) 5)  Cerumen Impaction, Bilateral  (ICD-380.4) 6)  Gout  (ICD-274.9) 7)  Nausea  (ICD-787.02) 8)  Sleepiness  (ICD-780.09) 9)  Lung Nodule  (ICD-518.89) 10)  Abdominal Pain, Epigastric  (ICD-789.06) 11)  Weight Loss, Abnormal  (ICD-783.21) 12)  Chills Without Fever  (ICD-780.64) 13)  Tobacco Abuse   (ICD-305.1) 14)  Observation For Suspected Malignant Neoplasm  (ICD-V71.1) 15)  Accidental Falls, Recurrent  (ICD-E888.9) 16)  Low Back Pain  (ICD-724.2) 17)  Impaired Fasting Glucose  (ICD-790.21) 18)  Lumbar Radiculopathy, Left  (ICD-724.4) 19)  Hyperlipidemia  (ICD-272.4) 20)  Dm W/manifestation Nec, Type II, Uncontrolled  (ICD-250.82) 21)  Depression  (ICD-311) 22)  Dementia  (ICD-294.8) 23)  Obstructive Sleep Apnea  (ICD-327.23) 24)  Melanoma, Malignant, Trunk  (ICD-172.5) 25)  Anemia Nos  (ICD-285.9) 26)  Disorder, Lumbar Disc W/myelopathy  (ICD-722.73)  Current Problems (verified): 1)  Atrial Fibrillation  (ICD-427.31) 2)  Hypotension, Orthostatic  (ICD-458.0) 3)  Other Specified Iron Deficiency Anemias  (ICD-280.8) 4)  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) 5)  Cerumen Impaction, Bilateral  (ICD-380.4) 6)  Gout  (ICD-274.9) 7)  Nausea  (ICD-787.02) 8)  Sleepiness  (ICD-780.09) 9)  Lung Nodule  (ICD-518.89) 10)  Abdominal Pain, Epigastric  (ICD-789.06) 11)  Weight Loss, Abnormal  (ICD-783.21) 12)  Chills Without Fever  (ICD-780.64) 13)  Tobacco Abuse  (ICD-305.1) 14)  Observation For Suspected Malignant Neoplasm  (ICD-V71.1) 15)  Accidental Falls, Recurrent  (ICD-E888.9) 16)  Low Back Pain  (ICD-724.2) 17)  Impaired Fasting Glucose  (ICD-790.21) 18)  Lumbar Radiculopathy, Left  (ICD-724.4) 19)  Hyperlipidemia  (ICD-272.4) 20)  Dm W/manifestation Nec, Type II, Uncontrolled  (ICD-250.82) 21)  Depression  (ICD-311) 22)  Dementia  (ICD-294.8) 23)  Obstructive Sleep Apnea  (ICD-327.23) 24)  Melanoma, Malignant, Trunk  (ICD-172.5) 25)  Anemia Nos  (ICD-285.9) 26)  Disorder, Lumbar Disc W/myelopathy  (ICD-722.73)  Medications Prior to Update: 1)  Lexapro 10 Mg  Tabs (Escitalopram Oxalate) .Marland Kitchen.. 1 Once Daily 2)  Miralax  Powd (Polyethylene Glycol 3350) .Marland Kitchen.. 1 Once Daily 3)  Methocarbamol 500 Mg Tabs (Methocarbamol) .Marland Kitchen.. 1 Every 6 Hours As Needed Muscle  Spasm 4)  Zofran 4 Mg Tabs (Ondansetron Hcl) .Marland Kitchen.. 1 Every 4-6 Hours As Needed For Nausea 5)  Colcrys 0.6 Mg Tabs (Colchicine) .... One By Mouth Daily For Gout 6)  Multivitamins  Caps (Multiple Vitamin) .Marland Kitchen.. 1 Once Daily 7)  Diazepam 5 Mg Tabs (Diazepam) .Marland Kitchen.. 1qhs As Needed 8)  Tamsulosin Hcl 0.4 Mg Caps (Tamsulosin Hcl) .Marland Kitchen.. 1 Once Daily 9)  Ms Contin 30 Mg Xr12h-Tab (Morphine Sulfate) .... One By Mouth Bid 10)  Cartia Xt 120 Mg Xr24h-Cap (Diltiazem Hcl Coated Beads) .... One By Mouth Daily  Current Medications (verified): 1)  Lexapro 10 Mg  Tabs (Escitalopram Oxalate) .Marland Kitchen.. 1 Once Daily 2)  Miralax  Powd (Polyethylene Glycol 3350) .Marland Kitchen.. 1 Once Daily 3)  Methocarbamol 500 Mg Tabs (Methocarbamol) .Marland Kitchen.. 1 Every 6 Hours As Needed Muscle Spasm 4)  Zofran 4 Mg Tabs (Ondansetron Hcl) .Marland Kitchen.. 1 Every 4-6 Hours As Needed For Nausea 5)  Colcrys 0.6 Mg Tabs (Colchicine) .... One By Mouth Daily For Gout 6)  Multivitamins  Caps (Multiple Vitamin) .Marland Kitchen.. 1 Once Daily 7)  Diazepam 5 Mg Tabs (Diazepam) .Marland Kitchen.. 1qhs As Needed 8)  Tamsulosin Hcl 0.4 Mg Caps (Tamsulosin Hcl) .Marland Kitchen.. 1 Once Daily 9)  Ms Contin 30 Mg Xr12h-Tab (Morphine Sulfate) .... One By Mouth Bid 10)  Cartia Xt 120 Mg Xr24h-Cap (Diltiazem Hcl Coated Beads) .... One By Mouth Daily  Allergies (verified): 1)  ! Sulfa 2)  * Dilaudid 3)  Fentanyl (Fentanyl)  Past History:  Family History: Last updated: Apr 17, 2008 Father died of cancer- testicular Family History of Prostate CA 1st degree relative <50 mother died of pancreatic cancer Family History of Arthritis  Social History: Last updated: 05/23/2008 Retired Married Pos smoking hx since age 41. had smoked up to 2 ppd. > quit 04/2008 Alcohol use-yes Drug use-no Regular exercise-no  Risk Factors: Alcohol Use: <1 (12/13/2009) Exercise: no (08/27/2006)  Risk Factors: Smoking Status: current (12/13/2009) Packs/Day: 1-2 packs per day (12/13/2009) Passive Smoke Exposure: no  (12/13/2009)  Past medical, surgical, family and social histories (including risk factors) reviewed, and no changes noted (except as noted below).  Past Medical History: Reviewed history from 05/23/2008 and no changes required. sleep apnea Dementia Depression Diabetes mellitus, type II Hyperlipidemia Low back pain ASBESTOSIS     -  PFT's 05/23/08 FEV1 2.61 ( 90%) ratio 77   no improvment after B2, nl DLC0  Past Surgical History: Reviewed history from 04-17-08 and no changes required. Cataract extraction Lumbar laminectomy Lumbar fusion foot fractures Tonsillectomy Melanoma lower left back "precancer"..............................................Marland KitchenGrubar  Family History: Reviewed history from 04/17/08 and no changes required. Father died of cancer- testicular Family History of Prostate CA 1st degree relative <50 mother died of pancreatic cancer Family History of Arthritis  Social History: Reviewed history from 05/23/2008 and no changes required. Retired Married Pos smoking hx since age 55. had smoked up to 2 ppd. > quit 04/2008 Alcohol use-yes Drug use-no Regular exercise-no  Review of Systems  The patient complains of depression.  The patient denies anorexia, fever, weight loss, weight gain, vision loss, decreased hearing, hoarseness, chest pain, syncope, dyspnea on exertion, peripheral edema, prolonged cough, headaches, hemoptysis, abdominal pain, melena, hematochezia, severe indigestion/heartburn, hematuria, incontinence, genital sores, muscle weakness, suspicious skin lesions, transient blindness, difficulty walking, and abnormal bleeding.    Physical Exam  General:  alert, cachetic, and pale.   Head:  atraumatic and male-pattern balding.   Eyes:  pupils equal and pupils reactive to light.   Nose:  External nasal examination shows no deformity or inflammation. Nasal mucosa are pink and moist without lesions or exudates. Mouth:  pharynx pink and moist and no  erythema.   Neck:  No deformities, masses, or tenderness noted. Lungs:  normal respiratory effort and no wheezes.   Heart:  normal rate and regular rhythm.   Abdomen:  soft, no guarding, no rigidity, and bowel sounds hypoactive.   Msk:  decreased ROM, joint tenderness, SI joint tenderness, and trigger point tenderness.   Pulses:  R and L carotid,radial,femoral,dorsalis pedis and posterior tibial pulses are full and equal bilaterally Extremities:  trace left pedal edema and trace right pedal edema.   Neurologic:  cranial nerves II-XII intact and abnormal gait.     Impression & Recommendations:  Problem # 1:  ATRIAL FIBRILLATION (ICD-427.31) stay on this due ot the holter showing no arrythmia but having anidodal hx of AF on EMS reports His updated medication list for this problem includes:    Cartia Xt 120 Mg Xr24h-cap (Diltiazem hcl coated beads) ..... One by mouth daily  Problem # 2:  LOW BACK PAIN (ICD-724.2) seeing Dr Vear Clock referred to [facility] and MRI planned as well as consderation for the oimplatable pain unit His updated medication list for this problem includes:    Methocarbamol 500 Mg Tabs (Methocarbamol) .Marland Kitchen... 1 every 6 hours as needed muscle spasm    Ms Contin 30 Mg Xr12h-tab (Morphine sulfate) ..... One by mouth bid  Discussed use of moist heat or ice, modified activities, medications, and stretching/strengthening exercises. Back care instructions given. To be seen in 2 weeks if no improvement; sooner if worsening of symptoms.   Problem # 3:  DM W/MANIFESTATION NEC, TYPE II, UNCONTROLLED (ICD-250.82) Assessment: Unchanged  Labs Reviewed: Creat: 1.3 (10/31/2009)    Reviewed HgBA1c results: 4.9 (10/31/2009)  5.1 (09/29/2009)  Problem # 4:  IMPAIRED FASTING GLUCOSE (ICD-790.21) small meals Labs Reviewed: Creat: 1.3 (10/31/2009)     Complete Medication List: 1)  Lexapro 10 Mg Tabs (Escitalopram oxalate) .Marland Kitchen.. 1 once daily 2)  Miralax Powd (Polyethylene glycol 3350) .Marland Kitchen..  1 once daily 3)  Methocarbamol 500 Mg Tabs (Methocarbamol) .Marland Kitchen.. 1 every 6 hours as needed muscle spasm 4)  Zofran 4 Mg Tabs (Ondansetron hcl) .Marland Kitchen.. 1 every 4-6 hours as needed for nausea 5)  Colcrys 0.6 Mg Tabs (Colchicine) .... One by mouth daily for gout 6)  Multivitamins Caps (Multiple vitamin) .Marland Kitchen.. 1 once daily 7)  Diazepam 5 Mg Tabs (Diazepam) .Marland Kitchen.. 1qhs as needed 8)  Tamsulosin Hcl 0.4 Mg Caps (Tamsulosin hcl) .Marland Kitchen.. 1 once daily 9)  Ms Contin 30 Mg Xr12h-tab (Morphine sulfate) .... One by mouth bid 10)  Cartia Xt 120 Mg Xr24h-cap (Diltiazem hcl coated beads) .... One by mouth daily  Lipid Assessment/Plan:      Based on NCEP/ATP III, the patient's risk factor category is "history of diabetes".  The patient's lipid goals are as follows: Total cholesterol goal is 200; LDL cholesterol goal is 100; HDL  cholesterol goal is 40; Triglyceride goal is 150.  His LDL cholesterol goal has been met.    Patient Instructions: 1)  Please schedule a follow-up appointment in 3 months.   Orders Added: 1)  Est. Patient Level IV [16109]

## 2010-02-28 ENCOUNTER — Telehealth: Payer: Self-pay | Admitting: Internal Medicine

## 2010-02-28 NOTE — Telephone Encounter (Signed)
Ok to change appointments

## 2010-02-28 NOTE — Telephone Encounter (Signed)
Wife calling would like to switch appt with pt that is scheduled next Friday.  Took pt to the ear dr to get his hearing checked today.  His ears are so clogged with wax and was told to see pcp to get wax out.  Please clean his ears next Friday at his appt.

## 2010-03-09 ENCOUNTER — Ambulatory Visit (INDEPENDENT_AMBULATORY_CARE_PROVIDER_SITE_OTHER): Payer: Medicare Other | Admitting: Internal Medicine

## 2010-03-09 ENCOUNTER — Encounter: Payer: Self-pay | Admitting: Internal Medicine

## 2010-03-09 VITALS — BP 120/60 | HR 88 | Temp 98.2°F | Resp 16 | Ht 71.0 in | Wt 166.0 lb

## 2010-03-09 DIAGNOSIS — H6120 Impacted cerumen, unspecified ear: Secondary | ICD-10-CM

## 2010-03-09 DIAGNOSIS — F17200 Nicotine dependence, unspecified, uncomplicated: Secondary | ICD-10-CM

## 2010-03-09 DIAGNOSIS — IMO0002 Reserved for concepts with insufficient information to code with codable children: Secondary | ICD-10-CM

## 2010-03-09 DIAGNOSIS — E1169 Type 2 diabetes mellitus with other specified complication: Secondary | ICD-10-CM

## 2010-03-09 DIAGNOSIS — I951 Orthostatic hypotension: Secondary | ICD-10-CM

## 2010-03-09 DIAGNOSIS — R634 Abnormal weight loss: Secondary | ICD-10-CM

## 2010-03-09 DIAGNOSIS — E1165 Type 2 diabetes mellitus with hyperglycemia: Secondary | ICD-10-CM

## 2010-03-09 LAB — HEMOGLOBIN A1C: Hgb A1c MFr Bld: 5.6 % (ref 4.6–6.5)

## 2010-03-09 LAB — BASIC METABOLIC PANEL
Calcium: 9.4 mg/dL (ref 8.4–10.5)
GFR: 50.26 mL/min — ABNORMAL LOW (ref >60.00)
Glucose, Bld: 160 mg/dL — ABNORMAL HIGH (ref 70–99)
Potassium: 3.9 mEq/L (ref 3.5–5.1)
Sodium: 140 mEq/L (ref 135–145)

## 2010-03-09 NOTE — Assessment & Plan Note (Signed)
Blood pressure is stable off his Cardizem Will continue to monitor off this medication no more hypotension has been detected

## 2010-03-09 NOTE — Assessment & Plan Note (Signed)
Monitoring a hemoglobin A1c for diabetic control his weight loss has stabilized he actually appears much improved 2 observation Will monitor the A1c to see if his diabetes is stabilized as well as a basic metabolic panel to look for today's glucose and his renal function

## 2010-03-09 NOTE — Progress Notes (Signed)
Subjective:    Patient ID: Ricardo Allen, male    DOB: 1925/09/09, 77 y.o.   MRN: [id number]  HPI  patient is a 77 year old white male who presents for monitoring of multiple problems including recent hypotension at which time his blood pressure medicines were discontinued and it has since resolved chronic back pain for which he is followed by Thyra Breed in the pain clinic and for cerumen impaction which was discovered when he went to  An audiologist for hearing loss  his weight loss has reversed but he has not been able to gain significant weight his pain medicine regimen has been  Simplified by Dr. Vear Clock.   He is no longer using the Avodart or the Flomax or diltiazem   Review of Systems  Constitutional: Positive for fatigue and unexpected weight change. Negative for fever.  HENT: Positive for hearing loss, ear pain and tinnitus. Negative for congestion, neck pain and postnasal drip.   Eyes: Negative for discharge, redness and visual disturbance.  Respiratory: Negative for cough, shortness of breath and wheezing.   Cardiovascular: Negative for leg swelling.  Gastrointestinal: Negative for abdominal pain, constipation and abdominal distention.  Genitourinary: Negative.  Negative for urgency and frequency.  Musculoskeletal: Negative for joint swelling and arthralgias.  Skin: Negative for color change and rash.  Neurological: Positive for weakness and numbness. Negative for light-headedness.  Hematological: Negative for adenopathy.  Psychiatric/Behavioral: Positive for decreased concentration. Negative for behavioral problems.   Past Medical History  Diagnosis Date  . Abdominal pain, epigastric 03/22/2008  . ANEMIA NOS 08/19/2006  . Atrial fibrillation 11/27/2009  . DEMENTIA 08/27/2006  . DEPRESSION 08/27/2006  . DISORDER, LUMBAR DISC W/MYELOPATHY 08/19/2006  . DM W/MANIFESTATION NEC, TYPE II, UNCONTROLLED 08/27/2006  . GOUT 03/22/2009  . HYPERLIPIDEMIA 08/27/2006  . HYPOTENSION,  ORTHOSTATIC 10/31/2009  . Impaired fasting glucose 01/27/2007  . LOW BACK PAIN 02/03/2007  . LUMBAR RADICULOPATHY, LEFT 10/09/2006  . LUNG NODULE 03/29/2008  . MELANOMA, MALIGNANT, TRUNK 08/19/2006  . OBSTRUCTIVE SLEEP APNEA 08/19/2006  . TRAUMATIC ARTHROPATHY PELVIC REGION AND THIGH 06/29/2009  . TOBACCO ABUSE 12/31/2007   Past Surgical History  Procedure Date  . Cataract extraction   . Lumbar laminectomy   . Lumbar fusion   . Tonsilectomy, adenoidectomy, bilateral myringotomy and tubes   . Melanoma excision     left lower back    reports that he quit smoking about 22 months ago. His smoking use included Cigarettes. He smoked 2 packs per day. He does not have any smokeless tobacco history on file. He reports that he drinks about 1.8 ounces of alcohol per week. He reports that he does not use illicit drugs. family history includes Arthritis in his brother; Cancer in his father and mother; Pancreatic cancer in his mother; and Testicular cancer in his father.        Objective:   Physical Exam  Constitutional: He is oriented to person, place, and time.        Pale white male with male pattern baldness in no apparent distress  HENT:        Cerumen impaction bilaterally  Neck: Normal range of motion. Neck supple.  Cardiovascular: Normal rate and regular rhythm.   Pulmonary/Chest: Effort normal and breath sounds normal.  Abdominal: Soft. Bowel sounds are normal.  Musculoskeletal: He exhibits tenderness.  Neurological: He is oriented to person, place, and time. No cranial nerve deficit. Coordination normal.  Skin: Skin is warm and dry.  Assessment & Plan:   he was seen for cerumen impaction and the ears were lavaged and cleaned and then further cleaned with a cerumen spoon he tolerated the procedure well he is orthostatic hypotension is resolved off the blood pressure medicines and the Flomax therefore we will discontinue the medications and monitor for the next 2 months 2 return  in 2 months for monitoring at that point his diabetes has been in moderately well-controlled and he states his CBGs are in the 100 range we will monitor an A1c today to confirm.  He will continue to followup with Dr. Vear Clock for pain control which she states is "extremely poor" he is wearing a back brace which has helped some but states that he has a great deal of breakthrough pain he is however alert and functioning with good color

## 2010-03-09 NOTE — Assessment & Plan Note (Signed)
He has stopped

## 2010-03-09 NOTE — Assessment & Plan Note (Signed)
Weight loss has stabilized however he has not been able to gain weight

## 2010-03-09 NOTE — Assessment & Plan Note (Signed)
The patient history and the opposite for bilateral cerumen impaction using a combination of water and peroxide his urine canals were lavaged until almost clear been taking this room and sputum the remainder of wax was dislodged and removed from the right ear

## 2010-03-22 ENCOUNTER — Ambulatory Visit: Payer: Self-pay | Admitting: Internal Medicine

## 2010-03-26 LAB — POCT CARDIAC MARKERS
CKMB, poc: <1 ng/mL — ABNORMAL LOW (ref 1.0–8.0)
Myoglobin, poc: 131 ng/mL (ref 12–200)
Troponin i, poc: <0.05 ng/mL (ref 0.00–0.09)

## 2010-03-26 LAB — COMPREHENSIVE METABOLIC PANEL
ALT: 16 U/L (ref 0–53)
AST: 28 U/L (ref 0–37)
Alkaline Phosphatase: 66 U/L (ref 39–117)
CO2: 29 mEq/L (ref 19–32)
Chloride: 102 mEq/L (ref 96–112)
GFR calc Af Amer: 56 mL/min — ABNORMAL LOW (ref >60)
GFR calc non Af Amer: 47 mL/min — ABNORMAL LOW (ref >60)
Glucose, Bld: 177 mg/dL — ABNORMAL HIGH (ref 70–99)
Potassium: 3.9 mEq/L (ref 3.5–5.1)
Sodium: 139 mEq/L (ref 135–145)
Total Bilirubin: 0.7 mg/dL (ref 0.3–1.2)

## 2010-03-26 LAB — CBC
HCT: 33.9 % — ABNORMAL LOW (ref 39.0–52.0)
Hemoglobin: 11.2 g/dL — ABNORMAL LOW (ref 13.0–17.0)
RBC: 3.84 MIL/uL — ABNORMAL LOW (ref 4.22–5.81)

## 2010-03-26 LAB — DIFFERENTIAL
Basophils Absolute: 0 10*3/uL (ref 0.0–0.1)
Basophils Relative: 0 % (ref 0–1)
Eosinophils Absolute: 0.1 10*3/uL (ref 0.0–0.7)
Eosinophils Relative: 1 % (ref 0–5)
Lymphs Abs: 1 10*3/uL (ref 0.7–4.0)
Neutrophils Relative %: 83 % — ABNORMAL HIGH (ref 43–77)

## 2010-03-28 LAB — URINALYSIS, ROUTINE W REFLEX MICROSCOPIC
Glucose, UA: NEGATIVE mg/dL
Protein, ur: NEGATIVE mg/dL

## 2010-03-28 LAB — GLUCOSE, CAPILLARY: Glucose-Capillary: 100 mg/dL — ABNORMAL HIGH (ref 70–99)

## 2010-03-28 LAB — POCT I-STAT, CHEM 8
Calcium, Ion: 1.16 mmol/L (ref 1.12–1.32)
Chloride: 106 mEq/L (ref 96–112)
HCT: 31 % — ABNORMAL LOW (ref 39.0–52.0)
TCO2: 29 mmol/L (ref 0–100)

## 2010-03-28 LAB — URINE CULTURE: Colony Count: NO GROWTH

## 2010-03-29 LAB — URINALYSIS, ROUTINE W REFLEX MICROSCOPIC
Glucose, UA: NEGATIVE mg/dL
Ketones, ur: NEGATIVE mg/dL
Nitrite: NEGATIVE
Protein, ur: NEGATIVE mg/dL

## 2010-03-29 LAB — CBC
MCH: 28.4 pg (ref 26.0–34.0)
MCH: 28.5 pg (ref 26.0–34.0)
MCHC: 30.9 g/dL (ref 30.0–36.0)
MCV: 91.3 fL (ref 78.0–100.0)
MCV: 92.2 fL (ref 78.0–100.0)
Platelets: 230 10*3/uL (ref 150–400)
Platelets: 238 10*3/uL (ref 150–400)
Platelets: 238 10*3/uL (ref 150–400)
RBC: 3.4 MIL/uL — ABNORMAL LOW (ref 4.22–5.81)
RBC: 3.43 MIL/uL — ABNORMAL LOW (ref 4.22–5.81)
RDW: 15.7 % — ABNORMAL HIGH (ref 11.5–15.5)
RDW: 15.7 % — ABNORMAL HIGH (ref 11.5–15.5)
RDW: 16 % — ABNORMAL HIGH (ref 11.5–15.5)
WBC: 8.8 10*3/uL (ref 4.0–10.5)

## 2010-03-29 LAB — COMPREHENSIVE METABOLIC PANEL
ALT: 13 U/L (ref 0–53)
AST: 23 U/L (ref 0–37)
Alkaline Phosphatase: 68 U/L (ref 39–117)
GFR calc Af Amer: >60 mL/min (ref >60)
Glucose, Bld: 97 mg/dL (ref 70–99)
Potassium: 4.1 mEq/L (ref 3.5–5.1)
Sodium: 138 mEq/L (ref 135–145)
Total Protein: 6.6 g/dL (ref 6.0–8.3)

## 2010-03-29 LAB — GLUCOSE, CAPILLARY
Glucose-Capillary: 100 mg/dL — ABNORMAL HIGH (ref 70–99)
Glucose-Capillary: 121 mg/dL — ABNORMAL HIGH (ref 70–99)
Glucose-Capillary: 135 mg/dL — ABNORMAL HIGH (ref 70–99)
Glucose-Capillary: 142 mg/dL — ABNORMAL HIGH (ref 70–99)
Glucose-Capillary: 94 mg/dL (ref 70–99)
Glucose-Capillary: 94 mg/dL (ref 70–99)

## 2010-03-29 LAB — BASIC METABOLIC PANEL
BUN: 17 mg/dL (ref 6–23)
CO2: 33 mEq/L — ABNORMAL HIGH (ref 19–32)
Chloride: 103 mEq/L (ref 96–112)
Creatinine, Ser: 1.21 mg/dL (ref 0.4–1.5)

## 2010-03-29 LAB — DIFFERENTIAL
Basophils Absolute: 0 10*3/uL (ref 0.0–0.1)
Basophils Absolute: 0 10*3/uL (ref 0.0–0.1)
Eosinophils Absolute: 0.4 10*3/uL (ref 0.0–0.7)
Eosinophils Relative: 6 % — ABNORMAL HIGH (ref 0–5)
Lymphocytes Relative: 25 % (ref 12–46)
Lymphs Abs: 2.2 10*3/uL (ref 0.7–4.0)
Neutro Abs: 5.3 10*3/uL (ref 1.7–7.7)
Neutrophils Relative %: 60 % (ref 43–77)

## 2010-03-29 LAB — CARDIAC PANEL(CRET KIN+CKTOT+MB+TROPI)
CK, MB: 0.8 ng/mL (ref 0.3–4.0)
Relative Index: INVALID (ref 0.0–2.5)
Relative Index: INVALID (ref 0.0–2.5)
Total CK: 24 U/L (ref 7–232)
Troponin I: 0.03 ng/mL (ref 0.00–0.06)

## 2010-03-29 LAB — HEMOGLOBIN A1C
Hgb A1c MFr Bld: 5 % (ref <5.7)
Mean Plasma Glucose: 97 mg/dL (ref <117)

## 2010-03-29 LAB — ABO/RH: ABO/RH(D): O POS

## 2010-03-29 LAB — CROSSMATCH
ABO/RH(D): O POS
Antibody Screen: NEGATIVE

## 2010-03-29 LAB — POCT CARDIAC MARKERS: Troponin i, poc: <0.05 ng/mL (ref 0.00–0.09)

## 2010-03-29 LAB — MAGNESIUM: Magnesium: 2 mg/dL (ref 1.5–2.5)

## 2010-03-29 LAB — CK TOTAL AND CKMB (NOT AT ARMC): CK, MB: 0.8 ng/mL (ref 0.3–4.0)

## 2010-03-31 LAB — CBC
HCT: 26.1 % — ABNORMAL LOW (ref 39.0–52.0)
MCH: 30.1 pg (ref 26.0–34.0)
MCH: 30.3 pg (ref 26.0–34.0)
MCHC: 33.6 g/dL (ref 30.0–36.0)
MCHC: 33.8 g/dL (ref 30.0–36.0)
MCV: 89.6 fL (ref 78.0–100.0)
MCV: 89.7 fL (ref 78.0–100.0)
Platelets: 230 10*3/uL (ref 150–400)
Platelets: 231 10*3/uL (ref 150–400)
RDW: 13.8 % (ref 11.5–15.5)
RDW: 13.9 % (ref 11.5–15.5)
WBC: 11.1 10*3/uL — ABNORMAL HIGH (ref 4.0–10.5)

## 2010-04-01 LAB — CBC
HCT: 28.7 % — ABNORMAL LOW (ref 39.0–52.0)
HCT: 30.1 % — ABNORMAL LOW (ref 39.0–52.0)
HCT: 34.8 % — ABNORMAL LOW (ref 39.0–52.0)
Hemoglobin: 11.4 g/dL — ABNORMAL LOW (ref 13.0–17.0)
MCH: 29.9 pg (ref 26.0–34.0)
MCH: 30 pg (ref 26.0–34.0)
MCH: 30.3 pg (ref 26.0–34.0)
MCH: 31.1 pg (ref 26.0–34.0)
MCHC: 32.8 g/dL (ref 30.0–36.0)
MCHC: 34.6 g/dL (ref 30.0–36.0)
MCV: 88.7 fL (ref 78.0–100.0)
MCV: 89 fL (ref 78.0–100.0)
MCV: 89.7 fL (ref 78.0–100.0)
MCV: 90 fL (ref 78.0–100.0)
MCV: 91.3 fL (ref 78.0–100.0)
Platelets: 243 10*3/uL (ref 150–400)
Platelets: 279 10*3/uL (ref 150–400)
Platelets: 404 10*3/uL — ABNORMAL HIGH (ref 150–400)
RBC: 2.95 MIL/uL — ABNORMAL LOW (ref 4.22–5.81)
RDW: 13.4 % (ref 11.5–15.5)
RDW: 13.4 % (ref 11.5–15.5)
RDW: 13.6 % (ref 11.5–15.5)
RDW: 13.6 % (ref 11.5–15.5)
WBC: 11.2 10*3/uL — ABNORMAL HIGH (ref 4.0–10.5)

## 2010-04-01 LAB — HEMOGLOBIN AND HEMATOCRIT, BLOOD
HCT: 22.1 % — ABNORMAL LOW (ref 39.0–52.0)
HCT: 25.3 % — ABNORMAL LOW (ref 39.0–52.0)
HCT: 27.5 % — ABNORMAL LOW (ref 39.0–52.0)
HCT: 30 % — ABNORMAL LOW (ref 39.0–52.0)
Hemoglobin: 7.4 g/dL — ABNORMAL LOW (ref 13.0–17.0)
Hemoglobin: 8.2 g/dL — ABNORMAL LOW (ref 13.0–17.0)
Hemoglobin: 8.3 g/dL — ABNORMAL LOW (ref 13.0–17.0)

## 2010-04-01 LAB — CROSSMATCH
ABO/RH(D): O POS
Antibody Screen: NEGATIVE

## 2010-04-01 LAB — BASIC METABOLIC PANEL
BUN: 17 mg/dL (ref 6–23)
BUN: 18 mg/dL (ref 6–23)
BUN: 22 mg/dL (ref 6–23)
CO2: 29 mEq/L (ref 19–32)
CO2: 29 mEq/L (ref 19–32)
Calcium: 8.7 mg/dL (ref 8.4–10.5)
Calcium: 8.9 mg/dL (ref 8.4–10.5)
Chloride: 99 mEq/L (ref 96–112)
Creatinine, Ser: 1.04 mg/dL (ref 0.4–1.5)
GFR calc Af Amer: >60 mL/min (ref >60)
GFR calc Af Amer: >60 mL/min (ref >60)
GFR calc Af Amer: >60 mL/min (ref >60)
GFR calc non Af Amer: >60 mL/min (ref >60)
GFR calc non Af Amer: >60 mL/min (ref >60)
Glucose, Bld: 105 mg/dL — ABNORMAL HIGH (ref 70–99)
Glucose, Bld: 123 mg/dL — ABNORMAL HIGH (ref 70–99)
Potassium: 3.7 mEq/L (ref 3.5–5.1)
Potassium: 3.7 mEq/L (ref 3.5–5.1)
Potassium: 4 mEq/L (ref 3.5–5.1)
Potassium: 4.1 mEq/L (ref 3.5–5.1)
Sodium: 134 mEq/L — ABNORMAL LOW (ref 135–145)
Sodium: 137 mEq/L (ref 135–145)
Sodium: 139 mEq/L (ref 135–145)

## 2010-04-01 LAB — URINALYSIS, ROUTINE W REFLEX MICROSCOPIC
Bilirubin Urine: NEGATIVE
Hgb urine dipstick: NEGATIVE
Ketones, ur: NEGATIVE mg/dL
Specific Gravity, Urine: 1.014 (ref 1.005–1.030)
Urobilinogen, UA: 1 mg/dL (ref 0.0–1.0)
pH: 6.5 (ref 5.0–8.0)

## 2010-04-01 LAB — GLUCOSE, CAPILLARY
Glucose-Capillary: 103 mg/dL — ABNORMAL HIGH (ref 70–99)
Glucose-Capillary: 119 mg/dL — ABNORMAL HIGH (ref 70–99)
Glucose-Capillary: 121 mg/dL — ABNORMAL HIGH (ref 70–99)
Glucose-Capillary: 122 mg/dL — ABNORMAL HIGH (ref 70–99)
Glucose-Capillary: 123 mg/dL — ABNORMAL HIGH (ref 70–99)
Glucose-Capillary: 130 mg/dL — ABNORMAL HIGH (ref 70–99)
Glucose-Capillary: 137 mg/dL — ABNORMAL HIGH (ref 70–99)
Glucose-Capillary: 142 mg/dL — ABNORMAL HIGH (ref 70–99)
Glucose-Capillary: 172 mg/dL — ABNORMAL HIGH (ref 70–99)
Glucose-Capillary: 82 mg/dL (ref 70–99)
Glucose-Capillary: 94 mg/dL (ref 70–99)
Glucose-Capillary: 96 mg/dL (ref 70–99)

## 2010-04-01 LAB — COMPREHENSIVE METABOLIC PANEL
Albumin: 2.7 g/dL — ABNORMAL LOW (ref 3.5–5.2)
Alkaline Phosphatase: 76 U/L (ref 39–117)
BUN: 25 mg/dL — ABNORMAL HIGH (ref 6–23)
Chloride: 102 mEq/L (ref 96–112)
Creatinine, Ser: 1.16 mg/dL (ref 0.4–1.5)
Glucose, Bld: 95 mg/dL (ref 70–99)
Total Bilirubin: 0.5 mg/dL (ref 0.3–1.2)
Total Protein: 6.9 g/dL (ref 6.0–8.3)

## 2010-04-01 LAB — DIFFERENTIAL
Basophils Absolute: 0 10*3/uL (ref 0.0–0.1)
Basophils Relative: 0 % (ref 0–1)
Lymphocytes Relative: 27 % (ref 12–46)
Monocytes Absolute: 0.9 10*3/uL (ref 0.1–1.0)
Neutro Abs: 5.6 10*3/uL (ref 1.7–7.7)
Neutrophils Relative %: 60 % (ref 43–77)

## 2010-04-01 LAB — APTT
aPTT: 31 seconds (ref 24–37)
aPTT: 34 seconds (ref 24–37)

## 2010-04-01 LAB — PROTIME-INR
INR: 1.18 (ref 0.00–1.49)
INR: 1.27 (ref 0.00–1.49)
INR: 1.73 — ABNORMAL HIGH (ref 0.00–1.49)
Prothrombin Time: 14.9 seconds (ref 11.6–15.2)
Prothrombin Time: 17 seconds — ABNORMAL HIGH (ref 11.6–15.2)
Prothrombin Time: 17.6 seconds — ABNORMAL HIGH (ref 11.6–15.2)
Prothrombin Time: 23.3 seconds — ABNORMAL HIGH (ref 11.6–15.2)

## 2010-04-01 LAB — BODY FLUID CULTURE

## 2010-04-01 LAB — GRAM STAIN

## 2010-04-01 LAB — ANAEROBIC CULTURE: Gram Stain: NONE SEEN

## 2010-04-01 LAB — ABO/RH: ABO/RH(D): O POS

## 2010-04-01 LAB — TYPE AND SCREEN: Antibody Screen: NEGATIVE

## 2010-04-06 LAB — GLUCOSE, CAPILLARY: Glucose-Capillary: 119 mg/dL — ABNORMAL HIGH (ref 70–99)

## 2010-04-18 ENCOUNTER — Ambulatory Visit (INDEPENDENT_AMBULATORY_CARE_PROVIDER_SITE_OTHER): Payer: Medicare Other | Admitting: Family Medicine

## 2010-04-18 ENCOUNTER — Encounter: Payer: Self-pay | Admitting: Family Medicine

## 2010-04-18 VITALS — BP 122/80 | HR 94 | Temp 97.9°F | Wt 164.0 lb

## 2010-04-18 DIAGNOSIS — IMO0002 Reserved for concepts with insufficient information to code with codable children: Secondary | ICD-10-CM

## 2010-04-18 DIAGNOSIS — M7710 Lateral epicondylitis, unspecified elbow: Secondary | ICD-10-CM

## 2010-04-18 NOTE — Progress Notes (Signed)
  Subjective:    Patient ID: Ricardo Allen, male    DOB: Aug 17, 1925, 77 y.o.   MRN: [id number]  HPI Here to talk about bilateral elbow pains that started 2 days ago, the got better yesterday, and which are gone today. There was no swelling or redness or warmth. No trauma. Aleve helped a lot.    Review of Systems  Constitutional: Negative.   Musculoskeletal: Positive for back pain and arthralgias. Negative for myalgias and joint swelling.       Objective:   Physical Exam  Constitutional: He appears well-developed and well-nourished.  Musculoskeletal: Normal range of motion. He exhibits no edema.       Mildly tender over the lateral and medial epicondyles of both elbows          Assessment & Plan:  Rest, ice, Aleve prn

## 2010-05-08 ENCOUNTER — Ambulatory Visit: Payer: Medicare Other | Admitting: Internal Medicine

## 2010-05-14 ENCOUNTER — Other Ambulatory Visit: Payer: Self-pay | Admitting: Internal Medicine
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
## 2010-06-01 NOTE — Op Note (Signed)
NAME:  Ricardo Allen, Ricardo Allen                 ACCOUNT NO.:  [id number]   MEDICAL RECORD NO.:  [id number]          PATIENT TYPE:  AMB   LOCATION:  [facility]                          FACILITY:  [facility]   PHYSICIAN:  Currie Paris, M.D.DATE OF BIRTH:  [birth year]   DATE OF PROCEDURE:  07/30/2004  DATE OF DISCHARGE:                                 OPERATIVE REPORT   PREOPERATIVE DIAGNOSES:  1.  Melanoma in situ of left back.  2.  Epidermoid cyst, left back.   POSTOPERATIVE DIAGNOSES:  1.  Melanoma in situ of left back.  2.  Epidermoid cyst, left back.   OPERATION:  1.  Re-excision melanoma site of left back.  2.  Excision of epidermoid cyst of the back.   MEDICAL HISTORY:  Mr. Frady has presented with a pigmented nevus of the back  which Dr. Danella Deis excised and this proved to be melanoma in situ with close  margin. She recommended re-excision with a 5 mm margin. In addition, the  patient had multiple epidermoid cysts and has developed another one in the  left back that he also wished excise while he was having the procedure for  the melanoma.   DESCRIPTION OF PROCEDURE:  The patient was seen in the minor procedure area  and had no further questions. Both areas were identified and marked.   The areas were anesthetized with 1% Xylocaine with epi. I waited about 10  minutes for this to have a full effect.   I excised the epidermoid cyst first. We made about a 2 cm incision and  excised the cyst completely. The incision was closed in layers with 3-0  Vicryl and 4-0 Monocryl subcuticular plus Dermabond.   Using new instruments the melanoma site was excised. This incision was 5 cm  long and we measured out a 5 mm margin in any direction around it. I did a  full thickness of skin and subcutaneous excision down to Scarpa fascia.  This incision measured 5 x 3 cm. This was likewise closed in layers with 3-0  Vicryl and 3-0 Monocryl subcuticular plus Dermabond.   The patient tolerated the procedure  well.       CJS/MEDQ  D:  07/30/2004  T:  07/30/2004  Job:  [id number]   cc:   Hope M. Danella Deis, M.D.  [address]  [place]  [place] [address]  Fax: [phone]

## 2010-06-01 NOTE — Discharge Summary (Signed)
[place]. [facility]  Patient:    Ricardo Allen, Ricardo Allen                        MRN: [id number] Adm. Date:  [id number] Disc. Date: 07/07/99 Attending:  Dolores Patty CC:         Stacie Glaze, M.D. [facility]                           Discharge Summary  HISTORY OF PRESENT ILLNESS:  Mr. Rodarte is a 77 year old white male patient of Dr. Darryll Capers admitted with abdominal pain and found to have a ureteral calculus.  Onset of pain was approximately 11 p.m. and he arrived at about 4 a.m.  He was admitted by Dr. Alwyn Ren due to intractable pain.  HOSPITAL COURSE:  Patient was seen in consultation by Dr. Boston Service. He feels the patient has a 95% chance to pass the stone on his own.  Diet was advanced and pain was under control.  Patient will be sent home with a urine strainer as well as pain medication.  He is to use his oral pain medicine if his pain should return or to return to the ER if he should have intractable pain.  FINAL DIAGNOSIS:  Left ureterovesical joint stone, 2 to 3 mm.  DISCHARGE MEDICATIONS: 1. Prozac once a day. 2. Celebrex 200 mg b.i.d. 3. Nasonex q.h.s. 4. Vicodin ES one tablet every 4 to 6 hours as needed for pain. 5. Phenergan 25 mg one tablet every 6 hours as needed for pain.  FOLLOW-UP:  Dr. Marcine Matar as planned.  DIET:  Regular.  ACTIVITIES:  As tolerated. DD:  07/07/99 TD:  07/08/99 Job: [id number]

## 2010-06-01 NOTE — Op Note (Signed)
[place]. [facility]  Patient:    Ricardo Allen, Ricardo Allen                        MRN: [id number] Proc. Date: 04/18/00 Adm. Date:  [id number] Attending:  Jacki Cones                           Operative Report  PREOPERATIVE DIAGNOSIS:  L1-3 herniated nucleus pulposus with stenosis and L4-5 spinal stenosis.  POSTOPERATIVE DIAGNOSIS:  L2-3 central and foraminal stenosis, L4-5 central and foraminal stenosis.  DESCRIPTION OF PROCEDURE:  L1-3 decompression, L4-5 decompression.  SURGEON:  Mark C. Ophelia Charter, M.D.  ASSISTANT:  Humberto Leep. Wyonia Hough, M.D.  ANESTHESIA:  GOT  ESTIMATED BLOOD LOSS: 300 ml.  HISTORY:  This 77 year old male has had back pain with claudication symptoms and some left thigh weakness present times greater than nine months.  He has been a series of three epidural steroid injections.  He has had two MRI scans, most recent in November without relief.  Pain has progressed and has become severe in the last two months to the point where he is having to take pain medication.  He can ambulate only a distance of three blocks before he has to stop, sit and rest.  L4-5 showed 6 mm of anterolisthesis but with flexion and extension x-rays there was no motion at this level and he had no instability documented on flexion and extension x-rays since he did not sublux anymore.  I discussed with the patient indications for fusion with 3 to 4 mm of motion on flexion and extension with instability as conservative guide lines.  He elected to proceed with decompression of 4-5.  DESCRIPTION OF PROCEDURE:  After induction of general anesthesia, the patient was placed on Andrews frame.  Preoperative Ancef was given prophylactically. A midline incision was made after prepping, draping, Betadine and Vi-drape and needle localization with cross table lateral x-ray with the needle just below 2-3 and the other needle at 4-5 level disc.  The L4-5 disc was approached first.   Subperiosteal dissection down on the lamina was performed and the entire spinous process of 4 was removed, top one-third of 5 and the lamina was removed of L4 starting in the midline.  There was hypertrophic ligamentum causing severe stenosis and giant clumps of ligament were removed until the dura was visualized.  Ligament hypertrophy was present lateral on each side as well.  This was removed and osteotome was used to remove lamina bone out to the level of the pedicle and bone was removed along the edge of the dura until the decompression extended all the way on the right and left side to the level of the pedicle.  Operating microscope was used for this portion of the case and the foramina were then enlarged.  Nerve root was visualized.  Disc was visualized on both sides.  Hockey stick was passed anterior to the dura and palpation through the dura revealed no areas of compression or tightness. Nerve roots were free and with satisfactory decompression tilting the scope right and left for visualization.  The operating microscope was swung out and the 2-3 level was surgically addressed.  MRI suggested that there had been some increase in the fragment on the left at 2-3 with the foraminal fragment. Due to the hypertrophic ligament and triangular compression at 2-3 a decompression was performed at this level removing thick chunks of ligament  out and decompressing the bone out to the level of the pedicle on both the right and left side which was carefully palpated with a hockey stick and visualized with the operating microscope.  The disc was rock hard.  There was traction spurs at 2 and at 3 that came up to the level of the disc and with palpation of the disc there were no rents in the posterior longitudinal ligament and out to the left, the disc was palpated and was flat.  There were some large chunks of ligament in the foramina causing displacement of the nerve and these were carefully  removed.  Foramina was enlarged until the nerve was completely decompressed.  Palpation above revealed no areas of compression once the ligament had been removed and the top portion of the L2 lamina was completely removed.  Palpation with hockey stick above showed no areas of compression and no areas of tightness out lateral at 2-3 on the left.  With no disc fragment found and nerve root above and at the level of the disc completely decompressed and decompressed on the right and left down below the L2 pedicle.  The area was irrigated and incision was closed with 0 Vicryl in the deep fascia figure-of-eight, 2-0 Vicryl interrupted in the subcutaneous tissue, Marcaine infiltration in the skin.  Skin staple closure and postoperative dressing.  Instrument count and needle count was correct.  Dura was intact.  There was no injury to the dura or nerves during the procedure and the patient was neurologically intact in the recovery room. DD:  04/18/00 TD:  04/18/00 Job: [id number]
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
## 2010-06-22 ENCOUNTER — Ambulatory Visit (INDEPENDENT_AMBULATORY_CARE_PROVIDER_SITE_OTHER): Payer: Medicare Other | Admitting: Internal Medicine

## 2010-06-22 ENCOUNTER — Encounter: Payer: Self-pay | Admitting: Internal Medicine

## 2010-06-22 VITALS — BP 140/70 | HR 88 | Temp 98.7°F | Resp 16 | Ht 70.0 in | Wt 166.0 lb

## 2010-06-22 DIAGNOSIS — M549 Dorsalgia, unspecified: Secondary | ICD-10-CM

## 2010-06-22 MED ORDER — B-6 FOLIC ACID 400-1000-50 MCG-MCG-MG PO CAPS: 1.0000 | ORAL_CAPSULE | Freq: Every day | ORAL | Status: DC

## 2010-06-22 MED ORDER — STEP N REST II WALKER MISC: Status: DC

## 2010-06-22 NOTE — Progress Notes (Signed)
  Subjective:    Patient ID: Ricardo Allen, male    DOB: [birth year], 77 y.o.   MRN: [id number]  HPI  Feels tired all the time and has constant pain Dr Vear Clock is managing the pain He has a new rx for oxycontin so that he can go out He is not driving He takes 1.5 of oxycontin   Review of Systems  Constitutional: Negative for fever and fatigue.  HENT: Negative for hearing loss, congestion, neck pain and postnasal drip.   Eyes: Negative for discharge, redness and visual disturbance.  Respiratory: Negative for cough, shortness of breath and wheezing.   Cardiovascular: Negative for leg swelling.  Gastrointestinal: Positive for abdominal pain and abdominal distention. Negative for constipation.  Genitourinary: Positive for frequency. Negative for urgency.  Musculoskeletal: Positive for back pain and gait problem. Negative for joint swelling and arthralgias.  Skin: Negative for color change and rash.  Neurological: Negative.  Negative for weakness and light-headedness.  Hematological: Negative.  Negative for adenopathy.  Psychiatric/Behavioral: Positive for behavioral problems and confusion.   Past Medical History  Diagnosis Date  . Abdominal pain, epigastric 03/22/2008  . ANEMIA NOS 08/19/2006  . Atrial fibrillation 11/27/2009  . DEMENTIA 08/27/2006  . DEPRESSION 08/27/2006  . DISORDER, LUMBAR DISC W/MYELOPATHY 08/19/2006  . DM W/MANIFESTATION NEC, TYPE II, UNCONTROLLED 08/27/2006  . GOUT 03/22/2009  . HYPERLIPIDEMIA 08/27/2006  . HYPOTENSION, ORTHOSTATIC 10/31/2009  . Impaired fasting glucose 01/27/2007  . LOW BACK PAIN 02/03/2007  . LUMBAR RADICULOPATHY, LEFT 10/09/2006  . LUNG NODULE 03/29/2008  . MELANOMA, MALIGNANT, TRUNK 08/19/2006  . OBSTRUCTIVE SLEEP APNEA 08/19/2006  . TRAUMATIC ARTHROPATHY PELVIC REGION AND THIGH 06/29/2009  . TOBACCO ABUSE 12/31/2007   Past Surgical History  Procedure Date  . Cataract extraction   . Lumbar laminectomy   . Lumbar fusion   . Tonsilectomy,  adenoidectomy, bilateral myringotomy and tubes   . Melanoma excision     left lower back    reports that he quit smoking about 2 years ago. His smoking use included Cigarettes. He smoked 2 packs per day. He does not have any smokeless tobacco history on file. He reports that he drinks about 1.8 ounces of alcohol per week. He reports that he does not use illicit drugs. family history includes Arthritis in his brother; Cancer in his father and mother; Pancreatic cancer in his mother; and Testicular cancer in his father. Allergies  Allergen Reactions  . Fentanyl     REACTION: nausea and depression  . Hydromorphone Hcl     REACTION: depressed and nausea  . Sulfonamide Derivatives     REACTION: hives        Objective:   Physical Exam  Constitutional: He appears well-developed and well-nourished.  HENT:  Head: Normocephalic and atraumatic.  Eyes: Conjunctivae are normal. Pupils are equal, round, and reactive to light.  Neck: Normal range of motion. Neck supple.  Cardiovascular: Normal rate, regular rhythm and normal heart sounds.   Pulmonary/Chest: Effort normal and breath sounds normal.  Musculoskeletal: He exhibits edema and tenderness.  Neurological: He is alert.  Skin: Skin is warm and dry.          Assessment & Plan:  Constipation due to medications Depression Pain control Fear of pain prevents walking Will rx walker with seat

## 2010-09-14 ENCOUNTER — Other Ambulatory Visit: Payer: Self-pay | Admitting: Internal Medicine

## 2010-10-17 ENCOUNTER — Encounter: Payer: Self-pay | Admitting: Internal Medicine

## 2010-10-17 ENCOUNTER — Ambulatory Visit (INDEPENDENT_AMBULATORY_CARE_PROVIDER_SITE_OTHER): Payer: Medicare Other | Admitting: Internal Medicine

## 2010-10-17 VITALS — BP 132/60 | HR 84 | Temp 98.2°F | Resp 16 | Ht 72.0 in | Wt 168.0 lb

## 2010-10-17 DIAGNOSIS — E1165 Type 2 diabetes mellitus with hyperglycemia: Secondary | ICD-10-CM

## 2010-10-17 DIAGNOSIS — H6120 Impacted cerumen, unspecified ear: Secondary | ICD-10-CM

## 2010-10-17 DIAGNOSIS — Z23 Encounter for immunization: Secondary | ICD-10-CM

## 2010-10-17 DIAGNOSIS — D508 Other iron deficiency anemias: Secondary | ICD-10-CM

## 2010-10-17 DIAGNOSIS — R634 Abnormal weight loss: Secondary | ICD-10-CM

## 2010-10-17 DIAGNOSIS — D649 Anemia, unspecified: Secondary | ICD-10-CM

## 2010-10-17 LAB — CBC WITH DIFFERENTIAL/PLATELET
Basophils Absolute: 0 10*3/uL (ref 0.0–0.1)
Eosinophils Absolute: 0.3 10*3/uL (ref 0.0–0.7)
Hemoglobin: 11.1 g/dL — ABNORMAL LOW (ref 13.0–17.0)
Lymphocytes Relative: 20.8 % (ref 12.0–46.0)
Lymphs Abs: 1.7 10*3/uL (ref 0.7–4.0)
MCHC: 32.7 g/dL (ref 30.0–36.0)
Neutro Abs: 5.4 10*3/uL (ref 1.4–7.7)
Platelets: 242 10*3/uL (ref 150.0–400.0)
RDW: 14.3 % (ref 11.5–14.6)

## 2010-10-17 LAB — BASIC METABOLIC PANEL
BUN: 39 mg/dL — ABNORMAL HIGH (ref 6–23)
Creatinine, Ser: 1.9 mg/dL — ABNORMAL HIGH (ref 0.4–1.5)
GFR: 36.82 mL/min — ABNORMAL LOW (ref >60.00)
Potassium: 5.1 mEq/L (ref 3.5–5.1)

## 2010-10-17 LAB — IRON: Iron: 61 ug/dL (ref 42–165)

## 2010-10-17 NOTE — Progress Notes (Signed)
  Subjective:    Patient ID: Ricardo Allen, male    DOB: 1925-08-08, 77 y.o.   MRN: [id number]  HPI Monitoring diabetic control he states his CBGs have been stable and he reports no hypoglycemic events.  He states his mental status has cleared since he has been on the new pain control regimen through pain management.  His weight loss has been reversed and his weight has stabilized.  We discussed monitoring his blood work today for hemoglobin A1c renal function and CBC with differential First history of iron deficiency anemia.     Review of Systems  Constitutional: Negative for fever and fatigue.  HENT: Negative for hearing loss, congestion, neck pain and postnasal drip.   Eyes: Negative for discharge, redness and visual disturbance.  Respiratory: Negative for cough, shortness of breath and wheezing.   Cardiovascular: Negative for leg swelling.  Gastrointestinal: Negative for abdominal pain, constipation and abdominal distention.  Genitourinary: Negative for urgency and frequency.  Musculoskeletal: Negative for joint swelling and arthralgias.  Skin: Negative for color change and rash.  Neurological: Negative for weakness and light-headedness.  Hematological: Negative for adenopathy.  Psychiatric/Behavioral: Negative for behavioral problems.   Past Medical History  Diagnosis Date  . Abdominal pain, epigastric 03/22/2008  . ANEMIA NOS 08/19/2006  . Atrial fibrillation 11/27/2009  . DEMENTIA 08/27/2006  . DEPRESSION 08/27/2006  . DISORDER, LUMBAR DISC W/MYELOPATHY 08/19/2006  . DM W/MANIFESTATION NEC, TYPE II, UNCONTROLLED 08/27/2006  . GOUT 03/22/2009  . HYPERLIPIDEMIA 08/27/2006  . HYPOTENSION, ORTHOSTATIC 10/31/2009  . Impaired fasting glucose 01/27/2007  . LOW BACK PAIN 02/03/2007  . LUMBAR RADICULOPATHY, LEFT 10/09/2006  . LUNG NODULE 03/29/2008  . MELANOMA, MALIGNANT, TRUNK 08/19/2006  . OBSTRUCTIVE SLEEP APNEA 08/19/2006  . TRAUMATIC ARTHROPATHY PELVIC REGION AND THIGH 06/29/2009  .  TOBACCO ABUSE 12/31/2007   Past Surgical History  Procedure Date  . Cataract extraction   . Lumbar laminectomy   . Lumbar fusion   . Tonsilectomy, adenoidectomy, bilateral myringotomy and tubes   . Melanoma excision     left lower back    reports that he quit smoking about 2 years ago. His smoking use included Cigarettes. He smoked 2 packs per day. He does not have any smokeless tobacco history on file. He reports that he drinks about 1.8 ounces of alcohol per week. He reports that he does not use illicit drugs. family history includes Arthritis in his brother; Cancer in his father and mother; Pancreatic cancer in his mother; and Testicular cancer in his father. Allergies  Allergen Reactions  . Fentanyl     REACTION: nausea and depression  . Hydromorphone Hcl     REACTION: depressed and nausea  . Sulfonamide Derivatives     REACTION: hives        Objective:   Physical Exam  Nursing note and vitals reviewed. Constitutional: He appears well-developed and well-nourished.  HENT:  Head: Normocephalic and atraumatic.  Eyes: Conjunctivae are normal. Pupils are equal, round, and reactive to light.  Neck: Normal range of motion. Neck supple.  Cardiovascular: Normal rate and regular rhythm.   Pulmonary/Chest: Effort normal and breath sounds normal.  Abdominal: Soft. Bowel sounds are normal.          Assessment & Plan:  His management through the pain clinic has helped his mental status and he is more alert and focused  OSA stable on CPAP AF stable Orthostatic hypotension resolved Weight loss reversed Monitor anemia with CBC, renal function

## 2010-10-17 NOTE — Assessment & Plan Note (Signed)
Abnormal weight loss pattern has been reversed patient's weight has stabilized for the last 2 visits he states that he seemed better and he feels "out of it on this new pain control regimen through pain management

## 2010-10-17 NOTE — Patient Instructions (Signed)
Patient was instructed to continue all medications as prescribed. To stop at the checkout desk and schedule a followup appointment  

## 2010-10-17 NOTE — Assessment & Plan Note (Signed)
Monitoring of CBC and iron levels today with history of iron deficiency anemia. The patient is moderately pale and At risk for anemia.

## 2010-10-17 NOTE — Assessment & Plan Note (Signed)
Hemoglobin A1c to be monitored today patient reports no hypoglycemic episodes he monitors his CPT and frequently but states that they have been stable

## 2010-10-19 LAB — HEMOGLOBIN A1C: Hgb A1c MFr Bld: 5.5 % (ref 4.6–6.5)

## 2010-12-03 ENCOUNTER — Other Ambulatory Visit: Payer: Self-pay | Admitting: Internal Medicine

## 2010-12-11 ENCOUNTER — Telehealth: Payer: Self-pay | Admitting: *Deleted

## 2010-12-11 NOTE — Telephone Encounter (Signed)
Patient is taking Celebrex  200mg  twice daily.  He is still having pain with his left hand.  He would like to know if he should take more Celebrex or try something different?

## 2010-12-11 NOTE — Telephone Encounter (Signed)
Pt will try capsaesin and will call into [facility]

## 2010-12-11 NOTE — Telephone Encounter (Signed)
Per dr Lovell Sheehan- may go to hand specialist or try capsaesin cream otc

## 2011-01-16 ENCOUNTER — Ambulatory Visit (INDEPENDENT_AMBULATORY_CARE_PROVIDER_SITE_OTHER): Payer: Medicare Other | Admitting: Internal Medicine

## 2011-01-16 ENCOUNTER — Encounter: Payer: Self-pay | Admitting: Internal Medicine

## 2011-01-16 DIAGNOSIS — D649 Anemia, unspecified: Secondary | ICD-10-CM

## 2011-01-16 DIAGNOSIS — Z79899 Other long term (current) drug therapy: Secondary | ICD-10-CM

## 2011-01-16 DIAGNOSIS — F329 Major depressive disorder, single episode, unspecified: Secondary | ICD-10-CM

## 2011-01-16 DIAGNOSIS — N289 Disorder of kidney and ureter, unspecified: Secondary | ICD-10-CM

## 2011-01-16 LAB — CBC WITH DIFFERENTIAL/PLATELET
Basophils Absolute: 0 10*3/uL (ref 0.0–0.1)
Lymphocytes Relative: 16.9 % (ref 12.0–46.0)
Monocytes Relative: 9.8 % (ref 3.0–12.0)
Neutrophils Relative %: 69.9 % (ref 43.0–77.0)
Platelets: 233 10*3/uL (ref 150.0–400.0)
RDW: 13.8 % (ref 11.5–14.6)

## 2011-01-16 LAB — BASIC METABOLIC PANEL
CO2: 30 mEq/L (ref 19–32)
Calcium: 9.1 mg/dL (ref 8.4–10.5)
Chloride: 104 mEq/L (ref 96–112)
Potassium: 5 mEq/L (ref 3.5–5.1)
Sodium: 141 mEq/L (ref 135–145)

## 2011-01-16 MED ORDER — CITALOPRAM HYDROBROMIDE 40 MG PO TABS: 40.0000 mg | ORAL_TABLET | Freq: Every day | ORAL | Status: DC

## 2011-01-16 NOTE — Progress Notes (Signed)
Subjective:    Patient ID: Ricardo Allen, male    DOB: July 14, 1925, 78 y.o.   MRN: [id number]  HPI  Has been ill over the holidays. Increased arthritic pain in hands and shoulders. Has been congested. No cough. Chronic sinus allergies and hx of sinus surgery. The aching pain has resulted in his "staying in bed" and has been seeing Dr Vear Clock ( has an appointment next week.) Has gained weight. Has not checked sugars and last A1C was 5.5 Hx of anemia, elevated creatinine. Review of Systems  Constitutional: Positive for fatigue. Negative for fever.  HENT: Negative for hearing loss, congestion, neck pain and postnasal drip.   Eyes: Negative for discharge, redness and visual disturbance.  Respiratory: Negative for cough, shortness of breath and wheezing.   Cardiovascular: Negative for leg swelling.  Gastrointestinal: Negative for abdominal pain, constipation and abdominal distention.  Genitourinary: Negative for urgency and frequency.  Musculoskeletal: Positive for myalgias, joint swelling and arthralgias.  Skin: Negative for color change and rash.  Neurological: Positive for weakness. Negative for light-headedness.  Hematological: Negative for adenopathy.  Psychiatric/Behavioral: Positive for sleep disturbance, dysphoric mood and decreased concentration. Negative for behavioral problems.   Past Medical History  Diagnosis Date  . Abdominal pain, epigastric 03/22/2008  . ANEMIA NOS 08/19/2006  . Campath-induced atrial fibrillation 11/27/2009  . DEMENTIA 08/27/2006  . DEPRESSION 08/27/2006  . DISORDER, LUMBAR DISC W/MYELOPATHY 08/19/2006  . DM W/MANIFESTATION NEC, TYPE II, UNCONTROLLED 08/27/2006  . GOUT 03/22/2009  . HYPERLIPIDEMIA 08/27/2006  . HYPOTENSION, ORTHOSTATIC 10/31/2009  . Impaired fasting glucose 01/27/2007  . LOW BACK PAIN 02/03/2007  . LUMBAR RADICULOPATHY, LEFT 10/09/2006  . LUNG NODULE 03/29/2008  . MELANOMA, MALIGNANT, TRUNK 08/19/2006  . OBSTRUCTIVE SLEEP APNEA 08/19/2006  .  TRAUMATIC ARTHROPATHY PELVIC REGION AND THIGH 06/29/2009  . TOBACCO ABUSE 12/31/2007    History   Social History  . Marital Status: Married    Spouse Name: N/A    Number of Children: N/A  . Years of Education: N/A   Occupational History  . Not on file.   Social History Main Topics  . Smoking status: Former Smoker -- 2.0 packs/day    Types: Cigarettes    Quit date: 04/14/2008  . Smokeless tobacco: Not on file  . Alcohol Use: 1.8 oz/week    3 Glasses of wine per week  . Drug Use: No  . Sexually Active: Not Currently   Other Topics Concern  . Not on file   Social History Narrative  . No narrative on file    Past Surgical History  Procedure Date  . Cataract extraction   . Lumbar laminectomy   . Lumbar fusion   . Tonsilectomy, adenoidectomy, bilateral myringotomy and tubes   . Melanoma excision     left lower back    Family History  Problem Relation Age of Onset  . Pancreatic cancer Mother   . Cancer Mother   . Testicular cancer Father   . Cancer Father   . Arthritis Brother     Allergies  Allergen Reactions  . Fentanyl     REACTION: nausea and depression  . Hydromorphone Hcl     REACTION: depressed and nausea  . Sulfonamide Derivatives     REACTION: hives    Current Outpatient Prescriptions on File Prior to Visit  Medication Sig Dispense Refill  . CELEBREX 200 MG capsule TAKE ONE CAPSULE TWICE A DAY  60 each  3  . Cobalamine Combinations (B-6 FOLIC ACID) [phone] MCG-MCG-MG CAPS Take  1 capsule by mouth daily.  30 each  11  . Colchicine (COLCRYS PO) Take by mouth daily.        Marland Kitchen escitalopram (LEXAPRO) 10 MG tablet TAKE ONE TABLET EVERY DAY                                                  Generic for LEXAPRO  30 tablet  6  . Misc. Devices (STEP N REST II WALKER) MISC Walker with seat for rest  1 each  0  . morphine (MS CONTIN) 30 MG 12 hr tablet Take 30 mg by mouth 2 (two) times daily.        . psyllium (METAMUCIL) 58.6 % packet Take 1 packet by mouth  daily.          BP 156/64  Pulse 88  Temp 98.2 F (36.8 C)  Resp 18  Ht 6' (1.829 m)  Wt 174 lb (78.926 kg)  BMI 23.60 kg/m2       Objective:   Physical Exam  Nursing note and vitals reviewed. Constitutional: He is oriented to person, place, and time. He appears well-developed and well-nourished.  HENT:  Head: Normocephalic and atraumatic.  Eyes: Conjunctivae are normal. Pupils are equal, round, and reactive to light.  Neck: Normal range of motion. Neck supple.  Cardiovascular: Normal rate and regular rhythm.   Pulmonary/Chest: Effort normal and breath sounds normal.  Abdominal: Soft. Bowel sounds are normal.  Neurological: He is alert and oriented to person, place, and time.  Skin: Skin is warm and dry.  Psychiatric:       depressed          Assessment & Plan:  We will monitor her a renal panel because of renal insufficiency to make sure that persistent dehydration is playing a role in his TEE did.  His pain medication and continues to be monitored and prescribed by the chronic pain clinic.  We will change the Lexapro to Celexa to 80 and his depression as well as have a synergistic effect with his pain medication.  We will monitor CBC differential for his anemia and a B12 level for his fatigue.  If either of these show intervention he could begin B12 injections

## 2011-01-16 NOTE — Patient Instructions (Addendum)
The patient is instructed to continue all medications as prescribed. Schedule followup with check out clerk upon leaving the clinic We are going to change the Lexapro to Celexa and use an increased dose of 40 mg a day the increase of Celexa may help with the depression as well as have a synergistic effect with his pain medication.

## 2011-01-25 IMAGING — CT CT HEAD W/O CM
1 of 2 series · 13 of 30 positions shown, 17 images · non-contrast
Comparison: Head CT 09/16/2009

CLINICAL DATA: Mental status changes and weakness.

CT HEAD WITHOUT CONTRAST
TECHNIQUE: Contiguous axial images were obtained from the base of
the skull through the vertex without contrast.

[Series 2: brain · axial · 0.49mm/px · z∈[+163,+298]mm · 13 of 32 slices shown, 17 images]
[im 3/32  brain]
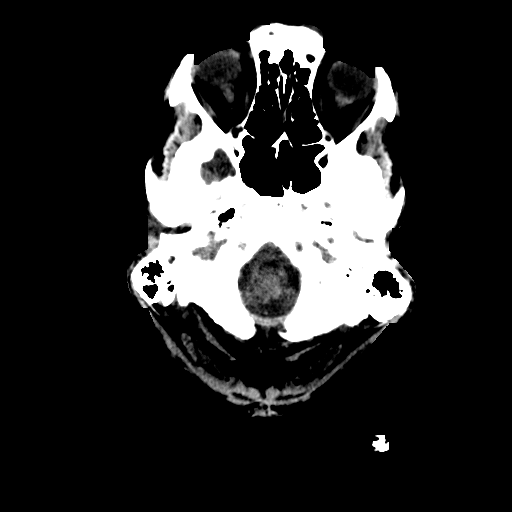
[im 3/32  bone]
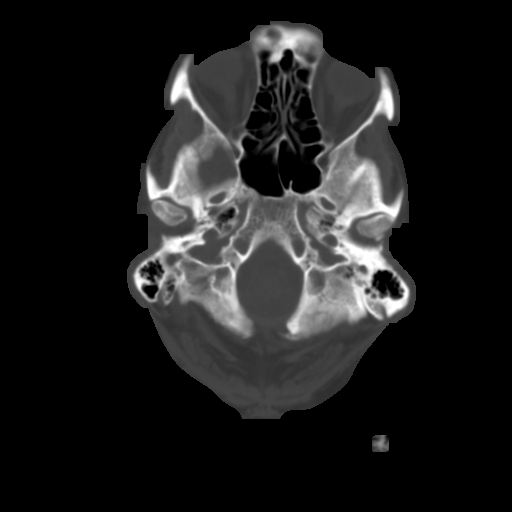
[im 5/32  brain]
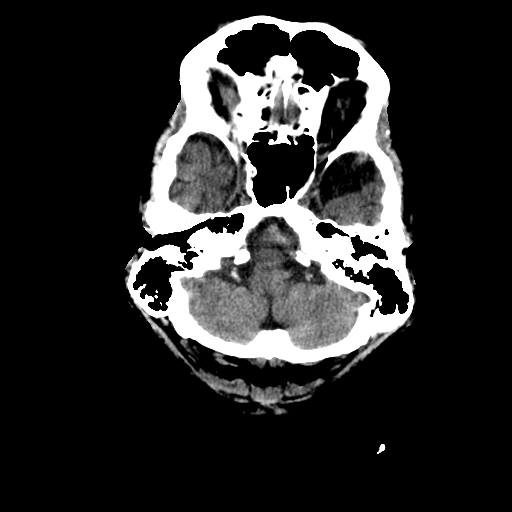
[im 7/32  brain]
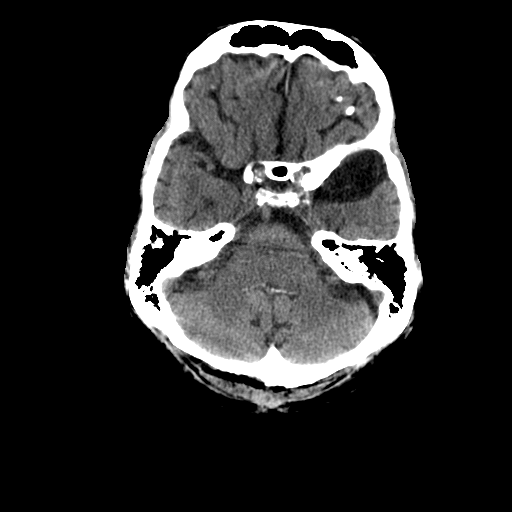
[im 9/32  brain]
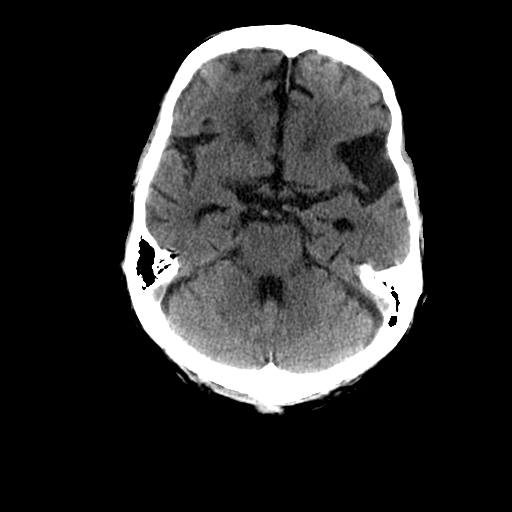
[im 12/32  brain]
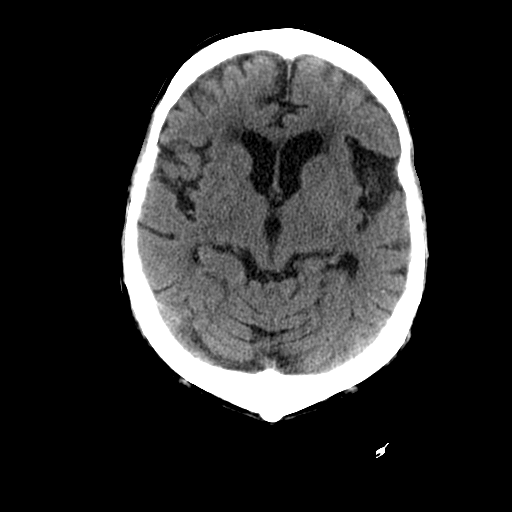
[im 12/32  bone]
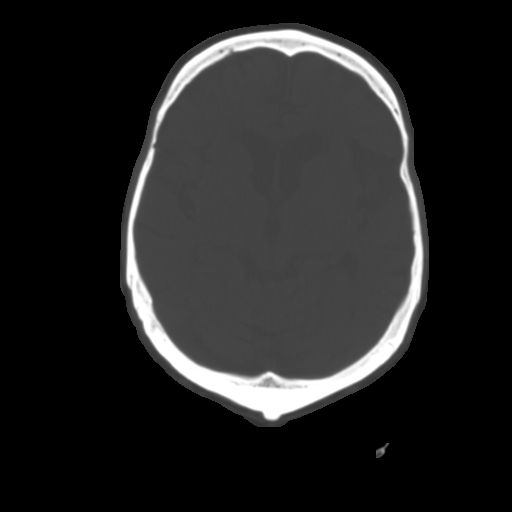
[im 14/32  brain]
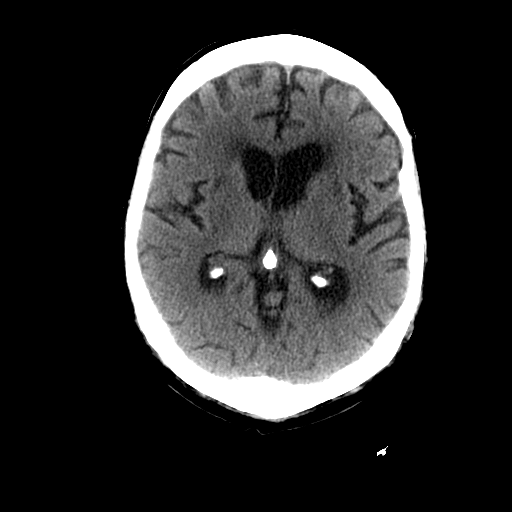
[im 16/32  brain]
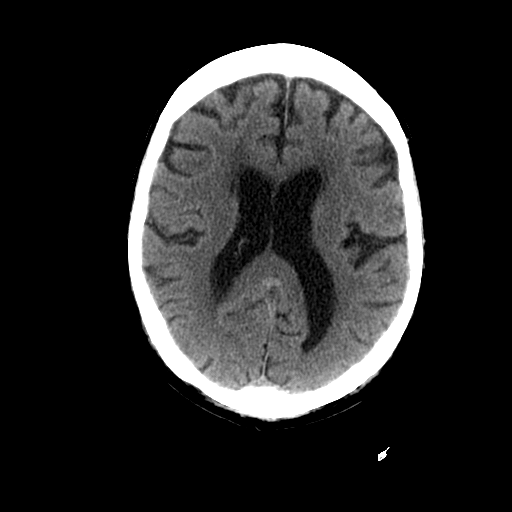
[im 18/32  brain]
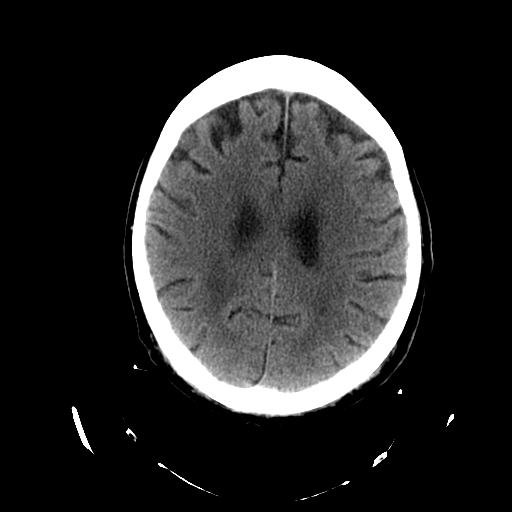
[im 20/32  brain]
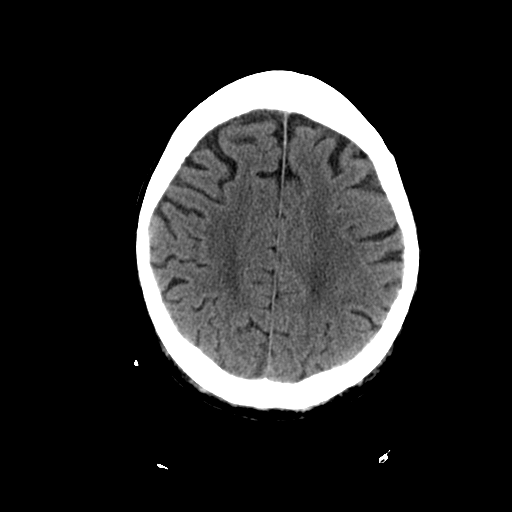
[im 20/32  bone]
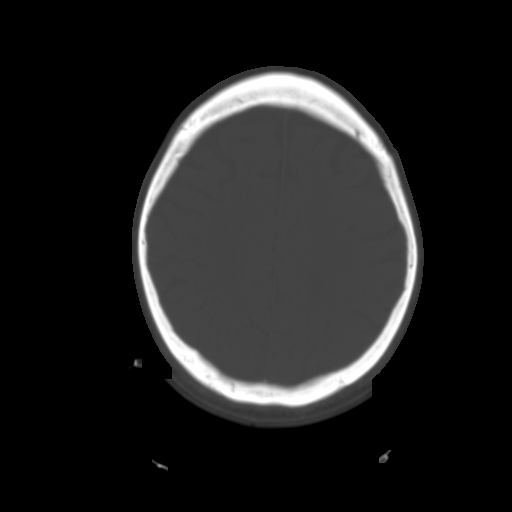
[im 23/32  brain]
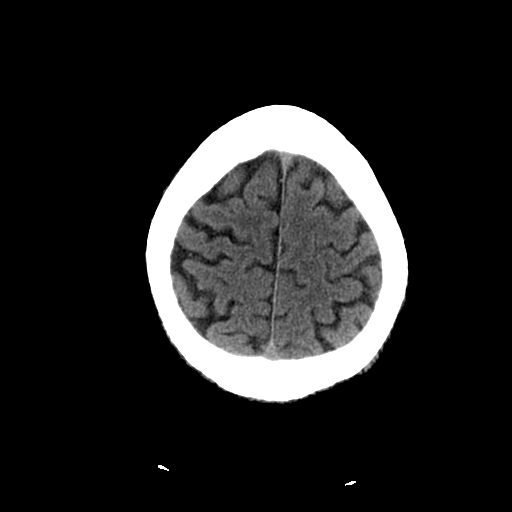
[im 25/32  brain]
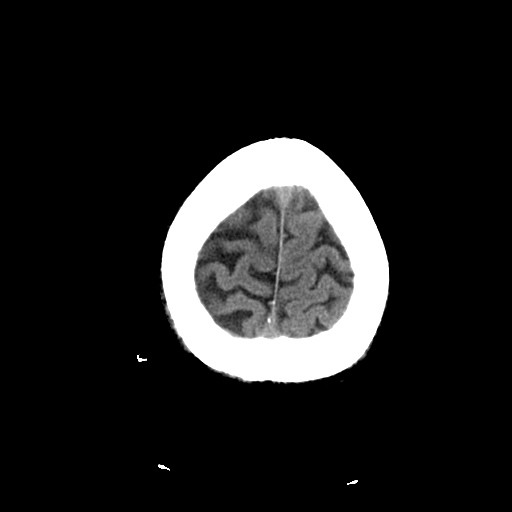
[im 27/32  brain]
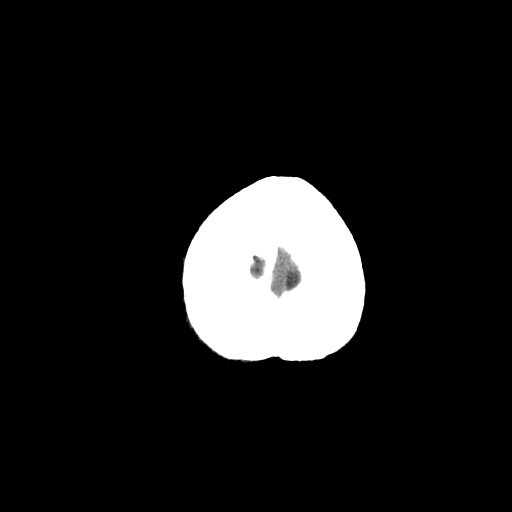
[im 29/32  brain]
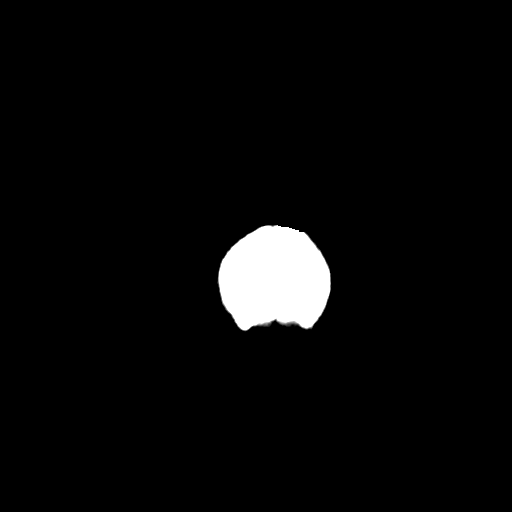
[im 29/32  bone]
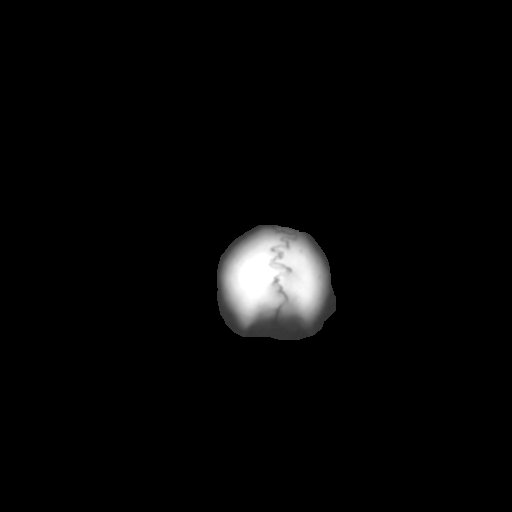

[13 of 30 positions shown; findings below may reference images not displayed]

FINDINGS: There is stable age related cerebral atrophy,
ventriculomegaly and periventricular white matter disease.  There
are remote lacunar type basal ganglia infarcts.  No CT findings for
new/acute hemispheric infarction or intracranial hemorrhage.  No
mass lesions.  The brainstem and cerebellum appear normal and
stable.

The bony calvarium is intact.  The visualized paranasal sinuses
mastoid air cells are clear.
IMPRESSION: 1.  Stable age related cerebral atrophy, ventriculomegaly and
periventricular white matter disease.
2.  No acute intracranial findings.

## 2011-01-31 ENCOUNTER — Other Ambulatory Visit: Payer: Self-pay | Admitting: Internal Medicine

## 2011-03-15 ENCOUNTER — Encounter: Payer: Self-pay | Admitting: Internal Medicine

## 2011-03-15 ENCOUNTER — Ambulatory Visit (INDEPENDENT_AMBULATORY_CARE_PROVIDER_SITE_OTHER): Payer: Medicare Other | Admitting: Internal Medicine

## 2011-03-15 VITALS — BP 142/80 | HR 84 | Temp 98.2°F | Resp 16 | Ht 72.0 in | Wt 174.0 lb

## 2011-03-15 DIAGNOSIS — M159 Polyosteoarthritis, unspecified: Secondary | ICD-10-CM

## 2011-03-15 DIAGNOSIS — T887XXA Unspecified adverse effect of drug or medicament, initial encounter: Secondary | ICD-10-CM

## 2011-03-15 DIAGNOSIS — N289 Disorder of kidney and ureter, unspecified: Secondary | ICD-10-CM

## 2011-03-15 LAB — BASIC METABOLIC PANEL
CO2: 29 mEq/L (ref 19–32)
GFR: 35.9 mL/min — ABNORMAL LOW (ref >60.00)
Glucose, Bld: 96 mg/dL (ref 70–99)
Potassium: 5.8 mEq/L — ABNORMAL HIGH (ref 3.5–5.1)
Sodium: 139 mEq/L (ref 135–145)

## 2011-03-15 MED ORDER — NABUMETONE 750 MG PO TABS: 750.0000 mg | ORAL_TABLET | Freq: Two times a day (BID) | ORAL | Status: DC

## 2011-03-15 NOTE — Patient Instructions (Signed)
Take one cup of white vinegar and 1 gallon of water and soaked your feet for 15-20 minutes every other night

## 2011-03-15 NOTE — Progress Notes (Signed)
  Subjective:    Patient ID: Ricardo Allen, male    DOB: Dec 10, 1925, 78 y.o.   MRN: [id number]  HPI Increased pain and is followed by Dr Vear Clock There is a concern about too much medications due to the history of "overuse" and falls He is off the lyrica and gabapentin And is off all antiinflamatory drugs  except celebrex and the pain in hands and shoulder has increased   He has a history of adult-onset diabetes it is moderately well-controlled he states his CBGs have been between 100-140 and he has not detected any hypoglycemia   Review of Systems  Constitutional: Positive for fever and fatigue.  HENT: Negative for hearing loss, congestion, neck pain and postnasal drip.   Eyes: Negative for discharge, redness and visual disturbance.  Respiratory: Negative for cough, shortness of breath and wheezing.   Cardiovascular: Positive for leg swelling.  Gastrointestinal: Negative for abdominal pain, constipation and abdominal distention.  Genitourinary: Negative for urgency and frequency.  Musculoskeletal: Positive for myalgias, back pain, joint swelling, arthralgias and gait problem.  Skin: Negative for color change and rash.  Neurological: Positive for weakness. Negative for light-headedness.  Hematological: Negative for adenopathy.  Psychiatric/Behavioral: Negative for behavioral problems.       Increased pain       Objective:   Physical Exam  Nursing note and vitals reviewed. Constitutional: He appears well-developed and well-nourished.  HENT:  Head: Normocephalic and atraumatic.  Eyes: Conjunctivae are normal. Pupils are equal, round, and reactive to light.  Neck: Normal range of motion. Neck supple.  Cardiovascular: Normal rate and regular rhythm.   Pulmonary/Chest: Effort normal and breath sounds normal.  Abdominal: Soft. Bowel sounds are normal.    Diabetic foot exam:  Left: Reflexes 1+   Vibratory sensation diminished  Proprioception diminished  Sharp/dull  discrimination diminished  Filament test present Right: Reflexes 2+   Vibratory sensation diminished  Proprioception diminished  Sharp/dull discrimination diminished  Filament test present        Assessment & Plan:  Patient's diabetes appears to be well-controlled at this time and we will obtain a hemoglobin A1c.  His history of iron deficiency anemia and iron and CBC should be monitored. He was encouraged to continue to follow up with Dr. Vear Clock and to only use one source for his pain medications. He was instructed to continue exercise because immobility and inactivity will increase the pain. He will continue his current medications with the addition of Relafen 750 mg by mouth twice a day as a nonsteroidal to help with pain control to allow him to increase his activity

## 2011-05-14 ENCOUNTER — Other Ambulatory Visit: Payer: Self-pay | Admitting: Internal Medicine

## 2011-05-24 ENCOUNTER — Encounter: Payer: Self-pay | Admitting: Internal Medicine

## 2011-05-24 ENCOUNTER — Ambulatory Visit (INDEPENDENT_AMBULATORY_CARE_PROVIDER_SITE_OTHER): Payer: Medicare Other | Admitting: Internal Medicine

## 2011-05-24 VITALS — BP 170/90 | HR 80 | Temp 98.2°F | Resp 16 | Ht 72.0 in | Wt 176.0 lb

## 2011-05-24 DIAGNOSIS — E119 Type 2 diabetes mellitus without complications: Secondary | ICD-10-CM

## 2011-05-24 DIAGNOSIS — T887XXA Unspecified adverse effect of drug or medicament, initial encounter: Secondary | ICD-10-CM

## 2011-05-24 DIAGNOSIS — D649 Anemia, unspecified: Secondary | ICD-10-CM

## 2011-05-24 DIAGNOSIS — IMO0002 Reserved for concepts with insufficient information to code with codable children: Secondary | ICD-10-CM

## 2011-05-24 DIAGNOSIS — E1165 Type 2 diabetes mellitus with hyperglycemia: Secondary | ICD-10-CM

## 2011-05-24 DIAGNOSIS — E785 Hyperlipidemia, unspecified: Secondary | ICD-10-CM

## 2011-05-24 LAB — LIPID PANEL: VLDL: 31 mg/dL (ref 0.0–40.0)

## 2011-05-24 LAB — CBC WITH DIFFERENTIAL/PLATELET
Eosinophils Relative: 3 % (ref 0.0–5.0)
HCT: 32.9 % — ABNORMAL LOW (ref 39.0–52.0)
Lymphocytes Relative: 19.9 % (ref 12.0–46.0)
Lymphs Abs: 1.4 10*3/uL (ref 0.7–4.0)
Monocytes Relative: 8.3 % (ref 3.0–12.0)
Neutrophils Relative %: 68.1 % (ref 43.0–77.0)
Platelets: 220 10*3/uL (ref 150.0–400.0)
WBC: 7.2 10*3/uL (ref 4.5–10.5)

## 2011-05-24 LAB — TSH: TSH: 1.66 u[IU]/mL (ref 0.35–5.50)

## 2011-05-24 LAB — HEPATIC FUNCTION PANEL
AST: 18 U/L (ref 0–37)
Albumin: 3.5 g/dL (ref 3.5–5.2)
Alkaline Phosphatase: 69 U/L (ref 39–117)

## 2011-05-24 LAB — LDL CHOLESTEROL, DIRECT: Direct LDL: 137.5 mg/dL

## 2011-05-24 NOTE — Patient Instructions (Addendum)
The patient is instructed to continue all medications as prescribed. Schedule followup with check out clerk upon leaving the clinic "practical paleo" diet plan   Last AIC was 5.5

## 2011-05-24 NOTE — Progress Notes (Signed)
Subjective:    Patient ID: NUR KRASINSKI, male    DOB: 1925-10-27, 78 y.o.   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 Negative for fatigue.  HENT: Negative for hearing loss, congestion, neck pain and postnasal drip.   Eyes: Negative for discharge, redness and visual disturbance.  Respiratory: Negative for cough, shortness of breath and wheezing.   Cardiovascular: Positive for leg swelling.  Gastrointestinal: Positive for abdominal distention. Negative for abdominal pain and constipation.  Genitourinary: Negative for urgency and frequency.  Musculoskeletal: Positive for myalgias, back pain, joint swelling, arthralgias and gait problem.  Skin: Negative for color change and rash.  Neurological: Positive for weakness and light-headedness.  Hematological: Negative for adenopathy.  Psychiatric/Behavioral: Negative for behavioral problems.   Past Medical History  Diagnosis Date  . Abdominal pain, epigastric 03/22/2008  . ANEMIA NOS 08/19/2006  . Atrial fibrillation 11/27/2009  . DEMENTIA 08/27/2006  . DEPRESSION 08/27/2006  . DISORDER, LUMBAR DISC W/MYELOPATHY 08/19/2006  . DM W/MANIFESTATION NEC, TYPE II, UNCONTROLLED 08/27/2006  . GOUT 03/22/2009  . HYPERLIPIDEMIA 08/27/2006  . HYPOTENSION, ORTHOSTATIC 10/31/2009  . Impaired fasting glucose 01/27/2007  . LOW BACK PAIN 02/03/2007  . LUMBAR RADICULOPATHY, LEFT 10/09/2006  . LUNG NODULE 03/29/2008  . MELANOMA, MALIGNANT, TRUNK 08/19/2006  . OBSTRUCTIVE SLEEP APNEA 08/19/2006  . TRAUMATIC ARTHROPATHY PELVIC REGION AND THIGH 06/29/2009  . TOBACCO ABUSE 12/31/2007  . Wet senile macular degeneration     History   Social History  . Marital Status:  Married    Spouse Name: N/A    Number of Children: N/A  . Years of Education: N/A   Occupational History  . Not on file.   Social History Main Topics  . Smoking status: Former Smoker -- 2.0 packs/day    Types: Cigarettes    Quit date: 04/14/2008  . Smokeless tobacco: Not on file  . Alcohol Use: 1.8 oz/week    3 Glasses of wine per week  . Drug Use: No  . Sexually Active: Not Currently   Other Topics Concern  . Not on file   Social History Narrative  . No narrative on file    Past Surgical History  Procedure Date  . Cataract extraction   . Lumbar laminectomy   . Lumbar fusion   . Tonsilectomy, adenoidectomy, bilateral myringotomy and tubes   . Melanoma excision     left lower back    Family History  Problem Relation Age of Onset  . Pancreatic cancer Mother   . Cancer Mother   . Testicular cancer Father   . Cancer Father   . Arthritis Brother     Allergies  Allergen Reactions  . Fentanyl     REACTION: nausea and depression  . Hydromorphone Hcl     REACTION: depressed and nausea  . Sulfonamide Derivatives     REACTION: hives    Current Outpatient Prescriptions on File Prior to Visit  Medication Sig Dispense Refill  . citalopram (CELEXA) 40 MG tablet TAKE ONE TABLET BY MOUTH ONCE DAILY  30 tablet  6  . Cobalamine Combinations (B-6 FOLIC ACID) [phone] MCG-MCG-MG CAPS Take 1 capsule by mouth daily.  30 each  11  . Colchicine (COLCRYS PO) Take by mouth  daily.        Marland Kitchen HYDROcodone-acetaminophen (VICODIN) 2.5-500 MG per tablet Take 1 tablet by mouth every 6 (six) hours as needed.        . Misc. Devices (STEP N REST II WALKER) MISC Walker with seat for rest  1 each  0  . morphine (MS CONTIN) 30 MG 12 hr tablet Take 30 mg by mouth 2 (two) times daily.        . psyllium (METAMUCIL) 58.6 % packet Take 1 packet by mouth daily.          BP 170/90  Pulse 80  Temp 98.2 F (36.8 C)  Resp 16  Ht 6' (1.829 m)  Wt 176 lb (79.833 kg)  BMI 23.87 kg/m2          Objective:   Physical Exam  Nursing note reviewed. Constitutional: He appears distressed.  HENT:  Head: Normocephalic and atraumatic.  Eyes: Conjunctivae normal are normal. Pupils are equal, round, and reactive to light.  Neck: Neck supple. No thyromegaly present.  Cardiovascular: Normal rate.   Murmur heard. Abdominal: He exhibits distension. There is tenderness. There is no rebound.  Musculoskeletal: He exhibits edema.  Neurological: He exhibits abnormal muscle tone. Coordination abnormal.  Skin: Skin is warm and dry. He is not diaphoretic.          Assessment & Plan:  He is due for monitoring of his lipid and liver.  We will obtain an A1c and a basic metabolic panel for diabetic control.   CBC differential 4 history of anemia, and with recent history of increased lower extremity edema.  Renal function should be measured as well as a renal for protein and glucose

## 2011-08-17 ENCOUNTER — Other Ambulatory Visit: Payer: Self-pay | Admitting: Internal Medicine

## 2011-08-28 ENCOUNTER — Ambulatory Visit (INDEPENDENT_AMBULATORY_CARE_PROVIDER_SITE_OTHER): Payer: Medicare Other | Admitting: Internal Medicine

## 2011-08-28 ENCOUNTER — Encounter: Payer: Self-pay | Admitting: Internal Medicine

## 2011-08-28 VITALS — BP 160/84 | HR 88 | Temp 98.0°F | Resp 18 | Ht 72.0 in | Wt 178.0 lb

## 2011-08-28 DIAGNOSIS — IMO0002 Reserved for concepts with insufficient information to code with codable children: Secondary | ICD-10-CM

## 2011-08-28 DIAGNOSIS — E1169 Type 2 diabetes mellitus with other specified complication: Secondary | ICD-10-CM

## 2011-08-28 DIAGNOSIS — E1165 Type 2 diabetes mellitus with hyperglycemia: Secondary | ICD-10-CM

## 2011-08-28 DIAGNOSIS — D508 Other iron deficiency anemias: Secondary | ICD-10-CM

## 2011-08-28 NOTE — Progress Notes (Signed)
Subjective:    Patient ID: Ricardo Allen, male    DOB: 1925/10/09, 78 y.o.   MRN: [id number]  HPI  She continues to go to for pain management although pain is a constant it is controlled.  Patient requires a handicap toilet seat for transfer issues.  He currently has a walker with a seat and rollers for when he is ambulating to prevent falls and also to aid in mobility.  The patient does have a relatively high fall risk  patient is also treated for chronic depression currently on citalopram 40 mg by mouth daily.  Patient will be transferring medical care to go for medical this is closer to his home 22 physician who might be able to see him in a more timely basis to appreciate being part of his care for many years but understand his needs.  Review of Systems  Constitutional: Positive for fatigue. Negative for fever.  HENT: Positive for hearing loss, rhinorrhea and postnasal drip. Negative for congestion and neck pain.   Eyes: Negative for discharge, redness and visual disturbance.  Respiratory: Positive for shortness of breath. Negative for cough and wheezing.   Cardiovascular: Negative for leg swelling.  Gastrointestinal: Negative for abdominal pain, constipation and abdominal distention.  Genitourinary: Positive for urgency and frequency.  Musculoskeletal: Positive for back pain, joint swelling and gait problem. Negative for arthralgias.  Skin: Negative for color change and rash.  Neurological: Positive for weakness. Negative for light-headedness.  Hematological: Negative for adenopathy.  Psychiatric/Behavioral: Negative for behavioral problems.   Past Medical History  Diagnosis Date  . Abdominal pain, epigastric 03/22/2008  . ANEMIA NOS 08/19/2006  . Atrial fibrillation 11/27/2009  . DEMENTIA 08/27/2006  . DEPRESSION 08/27/2006  . DISORDER, LUMBAR DISC W/MYELOPATHY 08/19/2006  . DM W/MANIFESTATION NEC, TYPE II, UNCONTROLLED 08/27/2006  . GOUT 03/22/2009  . HYPERLIPIDEMIA 08/27/2006  .  HYPOTENSION, ORTHOSTATIC 10/31/2009  . Impaired fasting glucose 01/27/2007  . LOW BACK PAIN 02/03/2007  . LUMBAR RADICULOPATHY, LEFT 10/09/2006  . LUNG NODULE 03/29/2008  . MELANOMA, MALIGNANT, TRUNK 08/19/2006  . OBSTRUCTIVE SLEEP APNEA 08/19/2006  . TRAUMATIC ARTHROPATHY PELVIC REGION AND THIGH 06/29/2009  . TOBACCO ABUSE 12/31/2007  . Wet senile macular degeneration     History   Social History  . Marital Status: Married    Spouse Name: N/A    Number of Children: N/A  . Years of Education: N/A   Occupational History  . Not on file.   Social History Main Topics  . Smoking status: Former Smoker -- 2.0 packs/day    Types: Cigarettes    Quit date: 04/14/2008  . Smokeless tobacco: Not on file  . Alcohol Use: 1.8 oz/week    3 Glasses of wine per week  . Drug Use: No  . Sexually Active: Not Currently   Other Topics Concern  . Not on file   Social History Narrative  . No narrative on file    Past Surgical History  Procedure Date  . Cataract extraction   . Lumbar laminectomy   . Lumbar fusion   . Tonsilectomy, adenoidectomy, bilateral myringotomy and tubes   . Melanoma excision     left lower back    Family History  Problem Relation Age of Onset  . Pancreatic cancer Mother   . Cancer Mother   . Testicular cancer Father   . Cancer Father   . Arthritis Brother     Allergies  Allergen Reactions  . Fentanyl     REACTION: nausea and depression  .  Hydromorphone Hcl     REACTION: depressed and nausea  . Sulfonamide Derivatives     REACTION: hives    Current Outpatient Prescriptions on File Prior to Visit  Medication Sig Dispense Refill  . citalopram (CELEXA) 40 MG tablet TAKE ONE TABLET BY MOUTH ONCE DAILY  30 tablet  6  . Cobalamine Combinations (B-6 FOLIC ACID) [phone] MCG-MCG-MG CAPS Take 1 capsule by mouth daily.  30 each  11  . Colchicine (COLCRYS PO) Take by mouth daily.        Marland Kitchen HYDROcodone-acetaminophen (VICODIN) 2.5-500 MG per tablet Take 1 tablet by  mouth every 6 (six) hours as needed.        . Misc. Devices (STEP N REST II WALKER) MISC Walker with seat for rest  1 each  0  . morphine (MS CONTIN) 30 MG 12 hr tablet Take 30 mg by mouth 2 (two) times daily.        . nabumetone (RELAFEN) 750 MG tablet TAKE ONE TABLET TWICE DAILY  60 tablet  0  . psyllium (METAMUCIL) 58.6 % packet Take 1 packet by mouth daily.          BP 160/84  Pulse 88  Temp 98 F (36.7 C)  Resp 18  Ht 6' (1.829 m)  Wt 178 lb (80.74 kg)  BMI 24.14 kg/m2       Objective:   Physical Exam  Constitutional: He appears well-developed and well-nourished.  HENT:  Head: Normocephalic and atraumatic.  Eyes: Conjunctivae are normal. Pupils are equal, round, and reactive to light.  Neck: Normal range of motion. Neck supple.  Cardiovascular: Normal rate and regular rhythm.   Murmur heard. Pulmonary/Chest: Effort normal and breath sounds normal.  Abdominal: Soft. Bowel sounds are normal.  Neurological: He displays abnormal reflex. He exhibits abnormal muscle tone. Coordination abnormal.          Assessment & Plan:  Prescription for a handicap was given to the patient.  Patient stable on current medications pain medications are managed by Dr. pain center.  Patient's blood pressure stable Active problem list includes mild anemia with a stable hemoglobin of approximately 12.  This is felt to be anemia of chronic disease he is on a combination B6 B12 folic acid prescription.  Patient has excellent control of his blood sugars with no current intervention his last A1c was 5.5.  The patient has high risk for fall and uses a rolling walker with seat and now is being given a handicap: To prevent fall.

## 2011-08-28 NOTE — Patient Instructions (Signed)
The patient is instructed to continue all medications as prescribed. Schedule followup with check out clerk upon leaving the clinic  

## 2012-06-18 ENCOUNTER — Inpatient Hospital Stay (HOSPITAL_COMMUNITY)
Admission: EM | Admit: 2012-06-18 | Discharge: 2012-06-23 | DRG: 379 | Disposition: A | Discharge status: Home Health | Payer: Medicare Other | Attending: Internal Medicine | Admitting: Internal Medicine

## 2012-06-18 ENCOUNTER — Encounter (HOSPITAL_COMMUNITY): Payer: Self-pay | Admitting: Internal Medicine

## 2012-06-18 DIAGNOSIS — I4891 Unspecified atrial fibrillation: Secondary | ICD-10-CM | POA: Diagnosis present

## 2012-06-18 DIAGNOSIS — D509 Iron deficiency anemia, unspecified: Secondary | ICD-10-CM | POA: Diagnosis present

## 2012-06-18 DIAGNOSIS — M109 Gout, unspecified: Secondary | ICD-10-CM | POA: Diagnosis present

## 2012-06-18 DIAGNOSIS — Z85820 Personal history of malignant melanoma of skin: Secondary | ICD-10-CM

## 2012-06-18 DIAGNOSIS — F329 Major depressive disorder, single episode, unspecified: Secondary | ICD-10-CM | POA: Diagnosis present

## 2012-06-18 DIAGNOSIS — T3995XA Adverse effect of unspecified nonopioid analgesic, antipyretic and antirheumatic, initial encounter: Secondary | ICD-10-CM | POA: Diagnosis present

## 2012-06-18 DIAGNOSIS — Z87891 Personal history of nicotine dependence: Secondary | ICD-10-CM

## 2012-06-18 DIAGNOSIS — K5730 Diverticulosis of large intestine without perforation or abscess without bleeding: Secondary | ICD-10-CM | POA: Diagnosis present

## 2012-06-18 DIAGNOSIS — E119 Type 2 diabetes mellitus without complications: Secondary | ICD-10-CM | POA: Diagnosis present

## 2012-06-18 DIAGNOSIS — F3289 Other specified depressive episodes: Secondary | ICD-10-CM | POA: Diagnosis present

## 2012-06-18 DIAGNOSIS — M899 Disorder of bone, unspecified: Secondary | ICD-10-CM | POA: Diagnosis present

## 2012-06-18 DIAGNOSIS — D649 Anemia, unspecified: Secondary | ICD-10-CM

## 2012-06-18 DIAGNOSIS — K5900 Constipation, unspecified: Secondary | ICD-10-CM | POA: Diagnosis present

## 2012-06-18 DIAGNOSIS — K922 Gastrointestinal hemorrhage, unspecified: Principal | ICD-10-CM | POA: Diagnosis present

## 2012-06-18 DIAGNOSIS — N183 Chronic kidney disease, stage 3 unspecified: Secondary | ICD-10-CM | POA: Diagnosis present

## 2012-06-18 DIAGNOSIS — Z791 Long term (current) use of non-steroidal anti-inflammatories (NSAID): Secondary | ICD-10-CM

## 2012-06-18 LAB — COMPREHENSIVE METABOLIC PANEL
AST: 38 U/L — ABNORMAL HIGH (ref 0–37)
Alkaline Phosphatase: 151 U/L — ABNORMAL HIGH (ref 39–117)
BUN: 31 mg/dL — ABNORMAL HIGH (ref 6–23)
CO2: 29 mEq/L (ref 19–32)
Chloride: 97 mEq/L (ref 96–112)
Creatinine, Ser: 1.64 mg/dL — ABNORMAL HIGH (ref 0.50–1.35)
GFR calc non Af Amer: 37 mL/min — ABNORMAL LOW (ref >90)
Potassium: 4.7 mEq/L (ref 3.5–5.1)
Total Bilirubin: 0.3 mg/dL (ref 0.3–1.2)

## 2012-06-18 LAB — URINALYSIS, ROUTINE W REFLEX MICROSCOPIC
Bilirubin Urine: NEGATIVE
Hgb urine dipstick: NEGATIVE
Ketones, ur: NEGATIVE mg/dL
Nitrite: NEGATIVE
Urobilinogen, UA: 1 mg/dL (ref 0.0–1.0)

## 2012-06-18 LAB — CBC WITH DIFFERENTIAL/PLATELET
Basophils Absolute: 0.1 10*3/uL (ref 0.0–0.1)
HCT: 26.3 % — ABNORMAL LOW (ref 39.0–52.0)
Hemoglobin: 8.3 g/dL — ABNORMAL LOW (ref 13.0–17.0)
Lymphocytes Relative: 19 % (ref 12–46)
Lymphs Abs: 1.5 10*3/uL (ref 0.7–4.0)
MCH: 28.4 pg (ref 26.0–34.0)
Monocytes Absolute: 1.4 10*3/uL — ABNORMAL HIGH (ref 0.1–1.0)
Monocytes Relative: 19 % — ABNORMAL HIGH (ref 3–12)
Neutro Abs: 3.6 10*3/uL (ref 1.7–7.7)
RBC: 2.92 MIL/uL — ABNORMAL LOW (ref 4.22–5.81)
WBC: 7.6 10*3/uL (ref 4.0–10.5)

## 2012-06-18 LAB — OCCULT BLOOD, POC DEVICE: Fecal Occult Bld: POSITIVE — AB

## 2012-06-18 MED ORDER — MORPHINE SULFATE ER 30 MG PO TBCR
30.0000 mg | EXTENDED_RELEASE_TABLET | Freq: Two times a day (BID) | ORAL | Status: DC
  Administered 2012-06-19 – 2012-06-23 (×10): 30 mg via ORAL
  Filled 2012-06-18 (×10): qty 1

## 2012-06-18 NOTE — ED Notes (Signed)
Admitting MD at bedside. Made Dr. Adela Glimpse aware that patient heart rate has been elevated at times (140's) and returns (110-120's)

## 2012-06-18 NOTE — ED Notes (Signed)
Bed:WA21<BR> Expected date:<BR> Expected time:<BR> Means of arrival:<BR> Comments:<BR> EMS

## 2012-06-18 NOTE — ED Notes (Signed)
Pt BIB EMS. Pt is from [place] at [facility]. Pt has had general weakness and fatigue for the past 2 weeks. Pt was told he had a low Hgb of 8.0 by MDs office. Pt denies pain, or shortness of breath. Pt was able to ambulate from bed to gurney per EMS. Pt appears pale, skin warm and dry. Pt with no acute distress, a/o x 4.

## 2012-06-18 NOTE — ED Notes (Signed)
Pt a/o x 4. Pt states he is not having any pain. States he gets a little dizzy when he stands up too fast. Pt also states he still feels weak. C/o slight nausea. Pt with breaths even/unlabored. No acute distress.

## 2012-06-18 NOTE — ED Notes (Signed)
Please call pt wife Britta Mccreedy at [phone] on admission

## 2012-06-18 NOTE — ED Provider Notes (Addendum)
History     CSN: [id number]  Arrival date & time 06/18/12  1635   First MD Initiated Contact with Patient 06/18/12 1645      Chief Complaint  Patient presents with  . Weakness    (Consider location/radiation/quality/duration/timing/severity/associated sxs/prior treatment) HPI..... level V caveat for dementia.  Patient complains of weakness, fatigue, dizziness for the past 2 weeks.   Recent history of anemia, uncertain etiology.  Patient is not on blood thinners, per his history.  No chest pain, dyspnea, melena, bright red blood per rectum  Past Medical History  Diagnosis Date  . Abdominal pain, epigastric 03/22/2008  . ANEMIA NOS 08/19/2006  . Atrial fibrillation 11/27/2009  . DEMENTIA 08/27/2006  . DEPRESSION 08/27/2006  . DISORDER, LUMBAR DISC W/MYELOPATHY 08/19/2006  . DM W/MANIFESTATION NEC, TYPE II, UNCONTROLLED 08/27/2006  . GOUT 03/22/2009  . HYPERLIPIDEMIA 08/27/2006  . HYPOTENSION, ORTHOSTATIC 10/31/2009  . Impaired fasting glucose 01/27/2007  . LOW BACK PAIN 02/03/2007  . LUMBAR RADICULOPATHY, LEFT 10/09/2006  . LUNG NODULE 03/29/2008  . MELANOMA, MALIGNANT, TRUNK 08/19/2006  . OBSTRUCTIVE SLEEP APNEA 08/19/2006  . TRAUMATIC ARTHROPATHY PELVIC REGION AND THIGH 06/29/2009  . TOBACCO ABUSE 12/31/2007  . Wet senile macular degeneration     Past Surgical History  Procedure Laterality Date  . Cataract extraction    . Lumbar laminectomy    . Lumbar fusion    . Tonsilectomy, adenoidectomy, bilateral myringotomy and tubes    . Melanoma excision      left lower back    Family History  Problem Relation Age of Onset  . Pancreatic cancer Mother   . Cancer Mother   . Testicular cancer Father   . Cancer Father   . Arthritis Brother     History  Substance Use Topics  . Smoking status: Former Smoker -- 2.00 packs/day    Types: Cigarettes    Quit date: 04/14/2008  . Smokeless tobacco: Not on file  . Alcohol Use: 1.8 oz/week    3 Glasses of wine per week      Review of  Systems  Unable to perform ROS   Allergies  Fentanyl; Hydromorphone hcl; and Sulfonamide derivatives  Home Medications   Current Outpatient Rx  Name  Route  Sig  Dispense  Refill  . citalopram (CELEXA) 40 MG tablet      TAKE ONE TABLET BY MOUTH ONCE DAILY   30 tablet   6   . Cobalamine Combinations (B-6 FOLIC ACID) [phone] MCG-MCG-MG CAPS   Oral   Take 1 capsule by mouth daily.   30 each   11   . Colchicine (COLCRYS PO)   Oral   Take by mouth daily.           Marland Kitchen HYDROcodone-acetaminophen (NORCO) 10-325 MG per tablet   Oral   Take 1 tablet by mouth every 6 (six) hours as needed for pain.         . Misc. Devices (STEP N REST II WALKER) MISC      Walker with seat for rest   1 each   0   . morphine (MS CONTIN) 30 MG 12 hr tablet   Oral   Take 30 mg by mouth 2 (two) times daily.           . nabumetone (RELAFEN) 750 MG tablet      TAKE ONE TABLET TWICE DAILY   60 tablet   0   . psyllium (METAMUCIL) 58.6 % packet   Oral   Take  1 packet by mouth daily.             BP 146/93  Pulse 95  Temp(Src) 98.2 F (36.8 C) (Oral)  Resp 18  Ht 6' (1.829 m)  Wt 170 lb (77.111 kg)  BMI 23.05 kg/m2  SpO2 96%  Physical Exam  Nursing note and vitals reviewed. Constitutional:  Pale, slightly demented  HENT:  Head: Normocephalic and atraumatic.  Eyes: Conjunctivae and EOM are normal. Pupils are equal, round, and reactive to light.  Neck: Normal range of motion. Neck supple.  Cardiovascular: Normal rate, regular rhythm and normal heart sounds.   Pulmonary/Chest: Effort normal and breath sounds normal.  Abdominal: Soft. Bowel sounds are normal.  Genitourinary:  Large stool burden in rectum. No masses. Heme positive  Musculoskeletal: Normal range of motion.  Neurological:  Motor sensory grossly normal  Skin: Skin is warm and dry.  Psychiatric:  Flat affect    ED Course  Procedures (including critical care time)  Labs Reviewed  CBC WITH DIFFERENTIAL  - Abnormal; Notable for the following:    RBC 2.92 (*)    Hemoglobin 8.3 (*)    HCT 26.3 (*)    Monocytes Relative 19 (*)    Monocytes Absolute 1.4 (*)    Eosinophils Relative 14 (*)    Eosinophils Absolute 1.0 (*)    All other components within normal limits  COMPREHENSIVE METABOLIC PANEL - Abnormal; Notable for the following:    Sodium 134 (*)    BUN 31 (*)    Creatinine, Ser 1.64 (*)    Albumin 3.3 (*)    AST 38 (*)    Alkaline Phosphatase 151 (*)    GFR calc non Af Amer 37 (*)    GFR calc Af Amer 43 (*)    All other components within normal limits  URINALYSIS, ROUTINE W REFLEX MICROSCOPIC  TROPONIN I  TYPE AND SCREEN  ABO/RH   No results found.   No diagnosis found.   Date: 06/18/2012  Rate:109  Rhythm: atrial fibrillation  QRS Axis: normal  Intervals: normal  ST/T Wave abnormalities: normal  Conduction Disutrbances:right bundle branch block and left anterior fascicular block  Narrative Interpretation:   Old EKG Reviewed: changes noted   MDM  Patient is hemodynamically stable with an anemia of uncertain etiology.  Rectal is heme positive for blood.  Admit to general medicine        Donnetta Hutching, MD 06/18/12 1610  Donnetta Hutching, MD 06/18/12 2332

## 2012-06-19 ENCOUNTER — Encounter (HOSPITAL_COMMUNITY): Payer: Self-pay | Admitting: Nurse Practitioner

## 2012-06-19 DIAGNOSIS — K922 Gastrointestinal hemorrhage, unspecified: Secondary | ICD-10-CM

## 2012-06-19 DIAGNOSIS — D649 Anemia, unspecified: Secondary | ICD-10-CM

## 2012-06-19 DIAGNOSIS — I4891 Unspecified atrial fibrillation: Secondary | ICD-10-CM

## 2012-06-19 DIAGNOSIS — K5730 Diverticulosis of large intestine without perforation or abscess without bleeding: Secondary | ICD-10-CM

## 2012-06-19 LAB — CBC
HCT: 29.8 % — ABNORMAL LOW (ref 39.0–52.0)
Hemoglobin: 8.1 g/dL — ABNORMAL LOW (ref 13.0–17.0)
Hemoglobin: 9.6 g/dL — ABNORMAL LOW (ref 13.0–17.0)
MCH: 28.2 pg (ref 26.0–34.0)
MCH: 28.4 pg (ref 26.0–34.0)
MCHC: 31.4 g/dL (ref 30.0–36.0)
MCHC: 32.2 g/dL (ref 30.0–36.0)
Platelets: 317 10*3/uL (ref 150–400)
RDW: 15.5 % (ref 11.5–15.5)

## 2012-06-19 LAB — LACTATE DEHYDROGENASE: LDH: 179 U/L (ref 94–250)

## 2012-06-19 LAB — TSH: TSH: 1.315 u[IU]/mL (ref 0.350–4.500)

## 2012-06-19 LAB — COMPREHENSIVE METABOLIC PANEL
ALT: 22 U/L (ref 0–53)
AST: 31 U/L (ref 0–37)
CO2: 29 mEq/L (ref 19–32)
Calcium: 9.4 mg/dL (ref 8.4–10.5)
Chloride: 98 mEq/L (ref 96–112)
GFR calc Af Amer: 50 mL/min — ABNORMAL LOW (ref >90)
GFR calc non Af Amer: 43 mL/min — ABNORMAL LOW (ref >90)
Glucose, Bld: 102 mg/dL — ABNORMAL HIGH (ref 70–99)
Sodium: 135 mEq/L (ref 135–145)
Total Bilirubin: 0.3 mg/dL (ref 0.3–1.2)

## 2012-06-19 LAB — FOLATE: Folate: 18.7 ng/mL

## 2012-06-19 LAB — RETICULOCYTES: Retic Count, Absolute: 120.5 10*3/uL (ref 19.0–186.0)

## 2012-06-19 LAB — VITAMIN B12: Vitamin B-12: 736 pg/mL (ref 211–911)

## 2012-06-19 MED ORDER — SODIUM CHLORIDE 0.9 % IV SOLN: INTRAVENOUS | Status: DC

## 2012-06-19 MED ORDER — ZOLPIDEM TARTRATE 5 MG PO TABS: 5.0000 mg | ORAL_TABLET | Freq: Every evening | ORAL | Status: DC | PRN

## 2012-06-19 MED ORDER — VITAMIN B-12 1000 MCG PO TABS
1000.0000 ug | ORAL_TABLET | Freq: Every day | ORAL | Status: DC
  Administered 2012-06-19 – 2012-06-23 (×5): 1000 ug via ORAL
  Filled 2012-06-19 (×5): qty 1

## 2012-06-19 MED ORDER — BUPROPION HCL ER (XL) 150 MG PO TB24
150.0000 mg | ORAL_TABLET | Freq: Every day | ORAL | Status: DC
  Filled 2012-06-19: qty 1

## 2012-06-19 MED ORDER — VITAMIN B-6 50 MG PO TABS
50.0000 mg | ORAL_TABLET | Freq: Every day | ORAL | Status: DC
  Administered 2012-06-19 – 2012-06-23 (×5): 50 mg via ORAL
  Filled 2012-06-19 (×5): qty 1

## 2012-06-19 MED ORDER — PANTOPRAZOLE SODIUM 40 MG IV SOLR
40.0000 mg | Freq: Every day | INTRAVENOUS | Status: DC
  Administered 2012-06-19: 40 mg via INTRAVENOUS
  Filled 2012-06-19 (×2): qty 40

## 2012-06-19 MED ORDER — SODIUM CHLORIDE 0.9 % IV SOLN
INTRAVENOUS | Status: DC
  Administered 2012-06-19 – 2012-06-20 (×3): via INTRAVENOUS

## 2012-06-19 MED ORDER — SENNOSIDES-DOCUSATE SODIUM 8.6-50 MG PO TABS
1.0000 | ORAL_TABLET | Freq: Every evening | ORAL | Status: DC | PRN
  Filled 2012-06-19: qty 1

## 2012-06-19 MED ORDER — CITALOPRAM HYDROBROMIDE 20 MG PO TABS
20.0000 mg | ORAL_TABLET | Freq: Every day | ORAL | Status: DC
  Administered 2012-06-19 – 2012-06-23 (×5): 20 mg via ORAL
  Filled 2012-06-19 (×5): qty 1

## 2012-06-19 MED ORDER — AMIODARONE HCL 200 MG PO TABS
200.0000 mg | ORAL_TABLET | Freq: Every day | ORAL | Status: DC
  Administered 2012-06-19 – 2012-06-23 (×5): 200 mg via ORAL
  Filled 2012-06-19 (×5): qty 1

## 2012-06-19 MED ORDER — FOLIC ACID 0.5 MG HALF TAB
0.5000 mg | ORAL_TABLET | Freq: Every day | ORAL | Status: DC
  Administered 2012-06-19 – 2012-06-23 (×5): 0.5 mg via ORAL
  Filled 2012-06-19 (×5): qty 1

## 2012-06-19 MED ORDER — PANTOPRAZOLE SODIUM 40 MG PO TBEC
40.0000 mg | DELAYED_RELEASE_TABLET | Freq: Every day | ORAL | Status: DC
  Administered 2012-06-19 – 2012-06-23 (×4): 40 mg via ORAL
  Filled 2012-06-19 (×6): qty 1

## 2012-06-19 MED ORDER — DOCUSATE SODIUM 100 MG PO CAPS
100.0000 mg | ORAL_CAPSULE | Freq: Every day | ORAL | Status: DC
  Administered 2012-06-19 – 2012-06-20 (×2): 100 mg via ORAL
  Filled 2012-06-19 (×4): qty 1

## 2012-06-19 MED ORDER — POTASSIUM CHLORIDE IN NACL 20-0.9 MEQ/L-% IV SOLN
INTRAVENOUS | Status: DC
  Administered 2012-06-19: 01:00:00 via INTRAVENOUS
  Filled 2012-06-19: qty 1000

## 2012-06-19 MED ORDER — ALLOPURINOL 100 MG PO TABS
100.0000 mg | ORAL_TABLET | Freq: Every day | ORAL | Status: DC
  Administered 2012-06-19 – 2012-06-23 (×5): 100 mg via ORAL
  Filled 2012-06-19 (×5): qty 1

## 2012-06-19 MED ORDER — HYDROCODONE-ACETAMINOPHEN 10-325 MG PO TABS: 1.0000 | ORAL_TABLET | Freq: Four times a day (QID) | ORAL | Status: DC | PRN

## 2012-06-19 MED ORDER — CITALOPRAM HYDROBROMIDE 20 MG PO TABS
20.0000 mg | ORAL_TABLET | Freq: Every day | ORAL | Status: DC
  Filled 2012-06-19: qty 1

## 2012-06-19 MED ORDER — POLYETHYLENE GLYCOL 3350 17 G PO PACK
17.0000 g | PACK | Freq: Every day | ORAL | Status: DC
  Administered 2012-06-19 – 2012-06-23 (×4): 17 g via ORAL
  Filled 2012-06-19 (×5): qty 1

## 2012-06-19 MED ORDER — COLCHICINE 0.6 MG PO TABS
0.6000 mg | ORAL_TABLET | Freq: Every day | ORAL | Status: DC
  Administered 2012-06-19 – 2012-06-23 (×5): 0.6 mg via ORAL
  Filled 2012-06-19 (×5): qty 1

## 2012-06-19 MED ORDER — B-6 FOLIC ACID 400-1000-50 MCG-MCG-MG PO CAPS: 1.0000 | ORAL_CAPSULE | Freq: Every day | ORAL | Status: DC

## 2012-06-19 MED ORDER — CITALOPRAM HYDROBROMIDE 40 MG PO TABS
40.0000 mg | ORAL_TABLET | Freq: Every day | ORAL | Status: DC
  Filled 2012-06-19: qty 1

## 2012-06-19 NOTE — Consult Note (Signed)
Reason for Consult: Atrial fibrillation Referring Physician: [facility] medical states  Ricardo Allen is an 79 y.o. male.  HPI: Patient is 79 year old male with past medical history significant for paroxysmal atrial fibrillation in the past, spinal stenosis, scoliosis, melanoma, history of depression, tobacco abuse, was admitted because of generalized weakness confusion feeling tired fatigued for last 2-3 weeks and was noted to be in atrial fibrillation with moderate went were response and also was noted to have hemoglobin of 8 with approximately 3 g drop in last 6 months. Patient states he has been taking Aleve 2 tablets daily for last few months. Denies any abdominal pain denies melena denies bright red blood per rectum. Denies nausea of vomiting hematemesis or hemoptysis. Patient denies any chest pain nausea vomiting diaphoresis. Denies any palpitation lightheadedness or syncope. Denies PND orthopnea leg swelling. Denies any thyroid problems in the past. Denies history of congestive heart failure or prior stroke.  Past Medical History  Diagnosis Date  . ANEMIA NOS 08/19/2006  . Atrial fibrillation 11/27/2009  . DEMENTIA 08/27/2006  . DEPRESSION 08/27/2006  . DISORDER, LUMBAR DISC W/MYELOPATHY 08/19/2006  . DM W/MANIFESTATION NEC, TYPE II, UNCONTROLLED 08/27/2006  . GOUT 03/22/2009  . HYPERLIPIDEMIA 08/27/2006  . HYPOTENSION, ORTHOSTATIC 10/31/2009  . Impaired fasting glucose 01/27/2007  . LOW BACK PAIN 02/03/2007  . LUMBAR RADICULOPATHY, LEFT 10/09/2006  . LUNG NODULE 03/29/2008  . MELANOMA, MALIGNANT, TRUNK 08/19/2006  . OBSTRUCTIVE SLEEP APNEA 08/19/2006  . TRAUMATIC ARTHROPATHY PELVIC REGION AND THIGH 06/29/2009  . TOBACCO ABUSE 12/31/2007  . Wet senile macular degeneration     Past Surgical History  Procedure Laterality Date  . Cataract extraction    . Lumbar laminectomy    . Lumbar fusion    . Tonsilectomy, adenoidectomy, bilateral myringotomy and tubes    . Melanoma excision      left lower  back    Family History  Problem Relation Age of Onset  . Pancreatic cancer Mother   . Cancer Mother   . Testicular cancer Father   . Cancer Father   . Arthritis Brother     Social History:  reports that he quit smoking about 4 years ago. His smoking use included Cigarettes. He smoked 2.00 packs per day. He does not have any smokeless tobacco history on file. He reports that he drinks about 1.8 ounces of alcohol per week. He reports that he does not use illicit drugs.  Allergies:  Allergies  Allergen Reactions  . Fentanyl     REACTION: nausea and depression  . Hydromorphone Hcl     REACTION: depressed and nausea  . Sulfonamide Derivatives     REACTION: hives    Medications: I have reviewed the patient's current medications.  Results for orders placed during the hospital encounter of 06/18/12 (from the past 48 hour(s))  CBC WITH DIFFERENTIAL     Status: Abnormal   Collection Time    06/18/12  5:00 PM      Result Value Range   WBC 7.6  4.0 - 10.5 K/uL   RBC 2.92 (*) 4.22 - 5.81 MIL/uL   Hemoglobin 8.3 (*) 13.0 - 17.0 g/dL   HCT 16.1 (*) 09.6 - 04.5 %   MCV 90.1  78.0 - 100.0 fL   MCH 28.4  26.0 - 34.0 pg   MCHC 31.6  30.0 - 36.0 g/dL   RDW 40.9  81.1 - 91.4 %   Platelets 346  150 - 400 K/uL   Neutrophils Relative % 48  43 -  77 %   Neutro Abs 3.6  1.7 - 7.7 K/uL   Lymphocytes Relative 19  12 - 46 %   Lymphs Abs 1.5  0.7 - 4.0 K/uL   Monocytes Relative 19 (*) 3 - 12 %   Monocytes Absolute 1.4 (*) 0.1 - 1.0 K/uL   Eosinophils Relative 14 (*) 0 - 5 %   Eosinophils Absolute 1.0 (*) 0.0 - 0.7 K/uL   Basophils Relative 1  0 - 1 %   Basophils Absolute 0.1  0.0 - 0.1 K/uL  COMPREHENSIVE METABOLIC PANEL     Status: Abnormal   Collection Time    06/18/12  5:00 PM      Result Value Range   Sodium 134 (*) 135 - 145 mEq/L   Potassium 4.7  3.5 - 5.1 mEq/L   Chloride 97  96 - 112 mEq/L   CO2 29  19 - 32 mEq/L   Glucose, Bld 91  70 - 99 mg/dL   BUN 31 (*) 6 - 23 mg/dL    Creatinine, Ser 1.61 (*) 0.50 - 1.35 mg/dL   Calcium 09.6  8.4 - 04.5 mg/dL   Total Protein 7.8  6.0 - 8.3 g/dL   Albumin 3.3 (*) 3.5 - 5.2 g/dL   AST 38 (*) 0 - 37 U/L   ALT 26  0 - 53 U/L   Alkaline Phosphatase 151 (*) 39 - 117 U/L   Total Bilirubin 0.3  0.3 - 1.2 mg/dL   GFR calc non Af Amer 37 (*) >90 mL/min   GFR calc Af Amer 43 (*) >90 mL/min   Comment:            The eGFR has been calculated     using the CKD EPI equation.     This calculation has not been     validated in all clinical     situations.     eGFR's persistently     <90 mL/min signify     possible Chronic Kidney Disease.  TROPONIN I     Status: None   Collection Time    06/18/12  5:00 PM      Result Value Range   Troponin I <0.30  <0.30 ng/mL   Comment:            Due to the release kinetics of cTnI,     a negative result within the first hours     of the onset of symptoms does not rule out     myocardial infarction with certainty.     If myocardial infarction is still suspected,     repeat the test at appropriate intervals.  TYPE AND SCREEN     Status: None   Collection Time    06/18/12  5:00 PM      Result Value Range   ABO/RH(D) O POS     Antibody Screen NEG     Sample Expiration 06/21/2012     Unit Number [id number]     Blood Component Type RED CELLS,LR     Unit division 00     Status of Unit ISSUED     Transfusion Status OK TO TRANSFUSE     Crossmatch Result Compatible    URINALYSIS, ROUTINE W REFLEX MICROSCOPIC     Status: None   Collection Time    06/18/12  6:53 PM      Result Value Range   Color, Urine YELLOW  YELLOW   APPearance CLEAR  CLEAR   Specific Gravity, Urine  1.014  1.005 - 1.030   pH 7.0  5.0 - 8.0   Glucose, UA NEGATIVE  NEGATIVE mg/dL   Hgb urine dipstick NEGATIVE  NEGATIVE   Bilirubin Urine NEGATIVE  NEGATIVE   Ketones, ur NEGATIVE  NEGATIVE mg/dL   Protein, ur NEGATIVE  NEGATIVE mg/dL   Urobilinogen, UA 1.0  0.0 - 1.0 mg/dL   Nitrite NEGATIVE  NEGATIVE    Leukocytes, UA NEGATIVE  NEGATIVE   Comment: MICROSCOPIC NOT DONE ON URINES WITH NEGATIVE PROTEIN, BLOOD, LEUKOCYTES, NITRITE, OR GLUCOSE <1000 mg/dL.  PROTIME-INR     Status: Abnormal   Collection Time    06/18/12  9:31 PM      Result Value Range   Prothrombin Time 15.6 (*) 11.6 - 15.2 seconds   INR 1.27  0.00 - 1.49  OCCULT BLOOD, POC DEVICE     Status: Abnormal   Collection Time    06/18/12  9:44 PM      Result Value Range   Fecal Occult Bld POSITIVE (*) NEGATIVE  COMPREHENSIVE METABOLIC PANEL     Status: Abnormal   Collection Time    06/19/12  4:25 AM      Result Value Range   Sodium 135  135 - 145 mEq/L   Potassium 4.7  3.5 - 5.1 mEq/L   Chloride 98  96 - 112 mEq/L   CO2 29  19 - 32 mEq/L   Glucose, Bld 102 (*) 70 - 99 mg/dL   BUN 27 (*) 6 - 23 mg/dL   Creatinine, Ser 2.13 (*) 0.50 - 1.35 mg/dL   Calcium 9.4  8.4 - 08.6 mg/dL   Total Protein 7.0  6.0 - 8.3 g/dL   Albumin 2.9 (*) 3.5 - 5.2 g/dL   AST 31  0 - 37 U/L   ALT 22  0 - 53 U/L   Alkaline Phosphatase 141 (*) 39 - 117 U/L   Total Bilirubin 0.3  0.3 - 1.2 mg/dL   GFR calc non Af Amer 43 (*) >90 mL/min   GFR calc Af Amer 50 (*) >90 mL/min   Comment:            The eGFR has been calculated     using the CKD EPI equation.     This calculation has not been     validated in all clinical     situations.     eGFR's persistently     <90 mL/min signify     possible Chronic Kidney Disease.  CBC     Status: Abnormal   Collection Time    06/19/12  4:25 AM      Result Value Range   WBC 8.2  4.0 - 10.5 K/uL   RBC 2.87 (*) 4.22 - 5.81 MIL/uL   Hemoglobin 8.1 (*) 13.0 - 17.0 g/dL   HCT 57.8 (*) 46.9 - 62.9 %   MCV 89.9  78.0 - 100.0 fL   MCH 28.2  26.0 - 34.0 pg   MCHC 31.4  30.0 - 36.0 g/dL   RDW 52.8  41.3 - 24.4 %   Platelets 317  150 - 400 K/uL  VITAMIN B12     Status: None   Collection Time    06/19/12  4:25 AM      Result Value Range   Vitamin B-12 736  211 - 911 pg/mL  FOLATE     Status: None   Collection  Time    06/19/12  4:25 AM  Result Value Range   Folate 18.7     Comment: (NOTE)     Reference Ranges            Deficient:       0.4 - 3.3 ng/mL            Indeterminate:   3.4 - 5.4 ng/mL            Normal:              > 5.4 ng/mL  IRON AND TIBC     Status: Abnormal   Collection Time    06/19/12  4:25 AM      Result Value Range   Iron 24 (*) 42 - 135 ug/dL   TIBC 161  096 - 045 ug/dL   Saturation Ratios 9 (*) 20 - 55 %   UIBC 232  125 - 400 ug/dL  FERRITIN     Status: Abnormal   Collection Time    06/19/12  4:25 AM      Result Value Range   Ferritin 1810 (*) 22 - 322 ng/mL   Comment: Result confirmed by automatic dilution.  RETICULOCYTES     Status: Abnormal   Collection Time    06/19/12  4:25 AM      Result Value Range   Retic Ct Pct 4.2 (*) 0.4 - 3.1 %   RBC. 2.87 (*) 4.22 - 5.81 MIL/uL   Retic Count, Manual 120.5  19.0 - 186.0 K/uL  PREPARE RBC (CROSSMATCH)     Status: None   Collection Time    06/19/12  6:41 AM      Result Value Range   Order Confirmation ORDER PROCESSED BY BLOOD BANK    HAPTOGLOBIN     Status: None   Collection Time    06/19/12  7:55 AM      Result Value Range   Haptoglobin 163  45 - 215 mg/dL  LACTATE DEHYDROGENASE     Status: None   Collection Time    06/19/12  7:55 AM      Result Value Range   LDH 179  94 - 250 U/L  TSH     Status: None   Collection Time    06/19/12  7:55 AM      Result Value Range   TSH 1.315  0.350 - 4.500 uIU/mL    No results found.  Review of Systems  Constitutional: Negative for fever and chills.  Eyes: Negative for blurred vision and double vision.  Gastrointestinal: Negative for nausea, vomiting and diarrhea.  Genitourinary: Negative for dysuria.  Skin: Negative for rash.  Neurological: Negative for dizziness and headaches.   Blood pressure 144/72, pulse 97, temperature 98.3 F (36.8 C), temperature source Oral, resp. rate 18, height 6' (1.829 m), weight 77.111 kg (170 lb), SpO2 95.00%. Physical  Exam  Constitutional: He is oriented to person, place, and time.  HENT:  Head: Normocephalic and atraumatic.  Eyes:  Conjunctiva pale sclera nonicteric  Neck: Normal range of motion. Neck supple. No JVD present. No tracheal deviation present. No thyromegaly present.  Cardiovascular: Exam reveals no gallop and no friction rub.   Regularly regular S1-S2 soft soft systolic murmur noted  Respiratory: Effort normal and breath sounds normal. No respiratory distress. He has no wheezes. He has no rales.  GI: Soft. Bowel sounds are normal. He exhibits no distension. There is no tenderness. There is no rebound.  Musculoskeletal: He exhibits no edema and no tenderness.  Lymphadenopathy:  He has no cervical adenopathy.  Neurological: He is alert and oriented to person, place, and time.    Assessment/Plan: Status post paroxysmal atrial fibrillation Status post probable upper GI bleeds secondary to NSAID History of paroxysmal atrial fib in the past History of Spinal stenosis History of scoliosis History of melanoma Depression Tobacco abuse Plan  Check 2-D echo and thyroid function We'll start him on amiodarone in view of the is paroxysmal atrial fibrillation hopefully it will keep him in sinus rhythm.  Patient CHADSVASC2 score is 2 probable risk of A. embolic stroke approximately 1-6%. Patient will benefit from chronic anticoagulation if okay from GI point of view. GI workup in progress Agree with stopping NSAID and PPI Dr. Algie Coffer on-call for weekend  [facility] 06/19/2012, 1:36 PM

## 2012-06-19 NOTE — Clinical Documentation Improvement (Signed)
DOCUMENTATION CLARIFICATION QUERY   THIS DOCUMENT IS NOT A PERMANENT PART OF THE MEDICAL RECORD  TO RESPOND TO THE THIS QUERY, FOLLOW THE INSTRUCTIONS BELOW:  1. If needed, update documentation for the patient's encounter via the notes activity.  2. Access this query again and click edit on the In [employer].  3. After updating, or not, click F2 to complete all highlighted (required) fields concerning your review. Select "additional documentation in the medical record" OR "no additional documentation provided".  4. Click Sign note button.  5. The deficiency will fall out of your In Basket *Please let us know if you are not able to complete this workflow by phone or e-mail (listed below).  Please update your documentation within the medical record to reflect your response to this query.                                                                                        06/19/12   Dear Dr[facility],  In a better effort to capture your patient's severity of illness, reflect appropriate length of stay and utilization of resources, a review of the patient medical record has revealed the following indicators.    Based on your clinical judgment, please clarify and document in a progress note and/or discharge summary the clinical condition associated with the following supporting information:  In responding to this query please exercise your independent judgment.  The fact that a query is asked, does not imply that any particular answer is desired or expected  Noted per 06/19/12 H&P..."CKD - daily BMPs"...."on NS 75cc/hr..." 06/19/12 am labs: BUN(mg/dL) 62Z; CREATININE(mg/dL) 3.08M ;GFR, Est Non Af Am(mL/min) 43L.    After study for accurate Dx specificity & severity can noted "CKD" be further specified w/ stage.  Thank you  Possible Clinical Conditions?  CKD Stage I -  GFR > OR = 90 CKD Stage II - GFR 60-80 CKD Stage III - GFR 30-59 CKD Stage IV - GFR 15-29 CKD Stage V  - GFR < 15 ESRD (End Stage Renal Disease) Other condition Cannot Clinically determine   Supporting Information: Risk Factors:  See Above note  Signs & Symptoms:  See Above note  Diagnostics: See Above Note  Treatment: See Above Note  You may use possible, probable, or suspect with inpatient documentation. possible, probable, suspected diagnoses MUST be documented at the time of discharge  Reviewed: additional documentation in the medical record   Thank You, Toribio Harbour, RN, BSN, CCDS Certified Clinical Documentation Specialist Pager: [phone] Health Information Management [place]

## 2012-06-19 NOTE — Progress Notes (Signed)
CSW received consult that patient was admitted from facility - patient is from Independent [facility] @ [facility]. Per PT evaluation, patient should be ok to return to Independent Living with his wife. RNCM to arrange for home health services/DME if needed. No further CSW needs identified - CSW signing off.   Unice Bailey, LCSW [facility] Clinical Social Worker cell #: [phone]

## 2012-06-19 NOTE — H&P (Addendum)
Physician Admission History and Physical     PCP:   Carrie Mew, MD   Chief Complaint:  Fatigue, malaise  HPI: Ricardo Allen is an 79 y.o. male.  Pt of mine who was in the office a few days ago w/ above CC. He was noted to have new onset anemia w/ Hgb of 8.6. Six months prior his Hgb was 11.7. He had a negative heme stool check in November 2013 at his physical. We arranged for the next available outpatient transfusion, which was Monday the 9th. His symptoms worsened per wife report so he was brought to the ED overnight for evaluation. He was found to have increased reticulocytes at 4.2 on lab work and his Hgb is at 8.3. He was noted to be tachycardic at 90-110's and his EKG showed new onset a fib. Blood pressure remained stable, no signs of hypotension. His work up did show heme POS stools PERFORMED by RECTAL EXAM. Per his reports and my record review, his last colonoscopy was 2009 w/ Dr Lovell Sheehan? and was normal. He will be admitted for transfusion and anemia workup.   Review of Systems:  ROS neg except as noted above . Pt denies melena, BRBPR, hemoptysis, hematemesis, any prior dx of a fib. He has just felt tired   Past Medical History: Past Medical History  Diagnosis Date  . Abdominal pain, epigastric 03/22/2008  . ANEMIA NOS 08/19/2006  . Atrial fibrillation 11/27/2009  . DEMENTIA 08/27/2006  . DEPRESSION 08/27/2006  . DISORDER, LUMBAR DISC W/MYELOPATHY 08/19/2006  . DM W/MANIFESTATION NEC, TYPE II, UNCONTROLLED 08/27/2006  . GOUT 03/22/2009  . HYPERLIPIDEMIA 08/27/2006  . HYPOTENSION, ORTHOSTATIC 10/31/2009  . Impaired fasting glucose 01/27/2007  . LOW BACK PAIN 02/03/2007  . LUMBAR RADICULOPATHY, LEFT 10/09/2006  . LUNG NODULE 03/29/2008  . MELANOMA, MALIGNANT, TRUNK 08/19/2006  . OBSTRUCTIVE SLEEP APNEA 08/19/2006  . TRAUMATIC ARTHROPATHY PELVIC REGION AND THIGH 06/29/2009  . TOBACCO ABUSE 12/31/2007  . Wet senile macular degeneration    Past Surgical History  Procedure Laterality  Date  . Cataract extraction    . Lumbar laminectomy    . Lumbar fusion    . Tonsilectomy, adenoidectomy, bilateral myringotomy and tubes    . Melanoma excision      left lower back    Medications: Prior to Admission medications   Medication Sig Start Date End Date Taking? Authorizing Provider  citalopram (CELEXA) 40 MG tablet TAKE ONE TABLET BY MOUTH ONCE DAILY 05/14/11  Yes Stacie Glaze, MD  Cobalamine Combinations (B-6 FOLIC ACID) [phone] MCG-MCG-MG CAPS Take 1 capsule by mouth daily. 06/22/10  Yes Stacie Glaze, MD  Colchicine (COLCRYS PO) Take by mouth daily.     Yes Historical Provider, MD  HYDROcodone-acetaminophen (NORCO) 10-325 MG per tablet Take 1 tablet by mouth every 6 (six) hours as needed for pain.   Yes Historical Provider, MD  Misc. Devices (STEP N REST II WALKER) MISC Walker with seat for rest 06/22/10  Yes Stacie Glaze, MD  morphine (MS CONTIN) 30 MG 12 hr tablet Take 30 mg by mouth 2 (two) times daily.     Yes Historical Provider, MD  psyllium (METAMUCIL) 58.6 % packet Take 1 packet by mouth daily.     Yes Historical Provider, MD    Allergies:   Allergies  Allergen Reactions  . Fentanyl     REACTION: nausea and depression  . Hydromorphone Hcl     REACTION: depressed and nausea  . Sulfonamide Derivatives  REACTION: hives    Social History:  reports that he quit smoking about 4 years ago. His smoking use included Cigarettes. He smoked 2.00 packs per day. He does not have any smokeless tobacco history on file. He reports that he drinks about 1.8 ounces of alcohol per week. He reports that he does not use illicit drugs.  Family History: Family History  Problem Relation Age of Onset  . Pancreatic cancer Mother   . Cancer Mother   . Testicular cancer Father   . Cancer Father   . Arthritis Brother     Physical Exam: Filed Vitals:   06/18/12 2311 06/18/12 2330 06/19/12 0034 06/19/12 0436  BP: 120/67 136/89 168/82 151/75  Pulse: 120 96 106 95  Temp:    98.8 F (37.1 C) 98.4 F (36.9 C)  TempSrc:   Oral Oral  Resp:  25 20 16   Height:   6' (1.829 m)   Weight:   170 lb (77.111 kg)   SpO2:  97% 98% 95%   Gen: elderly male in NAD  HEENT: MMM, EOMI  Cards: reg rhythm, tachy rate, no MRG  Pulm: no rales, rhonchi, wheezes  Abd: nontender, nondistended, normal BS  Ext: 4/5 LE strength, full ROM, otherwise normal  Neuro: AAOx4, no focal deficits     Labs on Admission:   Recent Labs  06/18/12 1700 06/19/12 0425  NA 134* 135  K 4.7 4.7  CL 97 98  CO2 29 29  GLUCOSE 91 102*  BUN 31* 27*  CREATININE 1.64* 1.44*  CALCIUM 10.1 9.4    Recent Labs  06/18/12 1700 06/19/12 0425  AST 38* 31  ALT 26 22  ALKPHOS 151* 141*  BILITOT 0.3 0.3  PROT 7.8 7.0  ALBUMIN 3.3* 2.9*   No results found for this basename: LIPASE, AMYLASE,  in the last 72 hours  Recent Labs  06/18/12 1700 06/19/12 0425  WBC 7.6 8.2  NEUTROABS 3.6  --   HGB 8.3* 8.1*  HCT 26.3* 25.8*  MCV 90.1 89.9  PLT 346 317    Recent Labs  06/18/12 1700  TROPONINI <0.30   Lab Results  Component Value Date   INR 1.27 06/18/2012   INR 1.23 07/29/2009   INR 1.24 07/28/2009   No results found for this basename: TSH, T4TOTAL, FREET3, T3FREE, THYROIDAB,  in the last 72 hours  Recent Labs  06/19/12 0425  RETICCTPCT 4.2*    Radiological Exams on Admission: No results found. Orders placed during the hospital encounter of 06/18/12  . ED EKG  . ED EKG  . EKG 12-LEAD  . EKG 12-LEAD  . EKG 12-LEAD    Assessment/Plan Principal Problem:   Anemia - Hgb appears to be relatively stable from 7 days prior. Heme pos stools, but this was performed by DRE so accuracy is always questionable and per his report no subjective signs of blood loss. Started on PPI and transfuse 1U PRBC today. recheck Hgb this afternoon and tomorrow AM. Retic count is appropriately elevated.GI consulted today to consider EGD in patient that may need anticoag in setting of new onset a fib.   Also checking for other sources of anemia w/ Folate, B12, iron, SPEP, UPEP, LDH, haptoglobin.   Active Problems:   Atrial fibrillation - Noted on EKG in ED. Appeared in sinus rhythm w/ tachycardic rate on exam this morning. Transferring to TELE bed. This is new diagnosis. If he remains in a fib after transfusions, his CHA2Ds2-VASc score is 2, 2.2% risk of  embolic event. May need to discuss chronic anticoagulation. I have consulted cardiology for their input. On questioning today he is not a great fall risk.       GI bleed - heme POS stools on DRE w/ symptomatic anemia. Will assess acuity based on Hgb response to 1U PRBC. GI consulted. Last scope was 5 years ago. Normal hemasure in office in Nov 2013  Osteopenia - holding BP while in house Joint pain - cont MS contin CKD - daily BMPs  Depression - cont home celexa   FEN - on NS 75cc/hr and liquid diet pending Hgb results after transfusion. If Hgb remains stable, stop IVF and resume full diet.   Dispo - pending stability of Hgb . Back to [facility] when stable        Aleeta Schmaltz 06/19/2012, 6:51 AM

## 2012-06-19 NOTE — Progress Notes (Signed)
  Echocardiogram 2D Echocardiogram has been performed.   06/19/2012 4:14 PM Gertie Fey, RDMS, RDCS

## 2012-06-19 NOTE — Consult Note (Signed)
[facility] Consultation  Referring Provider:     Alysia Penna, MD Primary Care Physician:  Alysia Penna, MD Primary Gastroenterologist:   Sheryn Bison, MD      Reason for Consultation:    Anemia          HPI:   Ricardo Allen is a 79 y.o. male recently seen by PCP for fatigue. He was found to be anemic with a hgb of 8.6, down from 11.7 six months prior. He was scheduled to have an outpatient transfusion on Monday be wife brought him to ED overnight with worsening symptoms. In ED hgb was 8.3, stools were heme positive, BP stable but he was hypotensive. Hgb 8.1 today. He takes chronic NSAIDS, no PPIs. No abdominal pain or nausea. No GERD symptoms. He feels okay.  Past Medical History  Diagnosis Date  . ANEMIA NOS 08/19/2006  . Atrial fibrillation 11/27/2009  . DEMENTIA 08/27/2006  . DEPRESSION 08/27/2006  . DISORDER, LUMBAR DISC W/MYELOPATHY 08/19/2006  . DM W/MANIFESTATION NEC, TYPE II, UNCONTROLLED 08/27/2006  . GOUT 03/22/2009  . HYPERLIPIDEMIA 08/27/2006  . HYPOTENSION, ORTHOSTATIC 10/31/2009  . Impaired fasting glucose 01/27/2007  . LOW BACK PAIN 02/03/2007  . LUMBAR RADICULOPATHY, LEFT 10/09/2006  . LUNG NODULE 03/29/2008  . MELANOMA, MALIGNANT, TRUNK 08/19/2006  . OBSTRUCTIVE SLEEP APNEA 08/19/2006  . TRAUMATIC ARTHROPATHY PELVIC REGION AND THIGH 06/29/2009  . TOBACCO ABUSE 12/31/2007  . Wet senile macular degeneration     Past Surgical History  Procedure Laterality Date  . Cataract extraction    . Lumbar laminectomy    . Lumbar fusion    . Tonsilectomy, adenoidectomy, bilateral myringotomy and tubes    . Melanoma excision      left lower back    Family History  Problem Relation Age of Onset  . Pancreatic cancer Mother   . Cancer Mother   . Testicular cancer Father   . Cancer Father   . Arthritis Brother    No colon cancer  History  Substance Use Topics  . Smoking status: Former Smoker -- 2.00 packs/day    Types: Cigarettes    Quit date: 04/14/2008  .  Smokeless tobacco: Not on file  . Alcohol Use: 1.8 oz/week    3 Glasses of wine per week    Prior to Admission medications   Medication Sig Start Date End Date Taking? Authorizing Provider  citalopram (CELEXA) 40 MG tablet TAKE ONE TABLET BY MOUTH ONCE DAILY 05/14/11  Yes Stacie Glaze, MD  Cobalamine Combinations (B-6 FOLIC ACID) [phone] MCG-MCG-MG CAPS Take 1 capsule by mouth daily. 06/22/10  Yes Stacie Glaze, MD  Colchicine (COLCRYS PO) Take by mouth daily.     Yes Historical Provider, MD  HYDROcodone-acetaminophen (NORCO) 10-325 MG per tablet Take 1 tablet by mouth every 6 (six) hours as needed for pain.   Yes Historical Provider, MD  Misc. Devices (STEP N REST II WALKER) MISC Walker with seat for rest 06/22/10  Yes Stacie Glaze, MD  morphine (MS CONTIN) 30 MG 12 hr tablet Take 30 mg by mouth 2 (two) times daily.     Yes Historical Provider, MD  psyllium (METAMUCIL) 58.6 % packet Take 1 packet by mouth daily.     Yes Historical Provider, MD    Current Facility-Administered Medications  Medication Dose Route Frequency Provider Last Rate Last Dose  . [START ON 06/22/2012] 0.9 %  sodium chloride infusion   Intravenous Continuous Alysia Penna, MD      . 0.9 %  sodium chloride infusion   Intravenous Continuous Alysia Penna, MD 75 mL/hr at 06/19/12 0815    . allopurinol (ZYLOPRIM) tablet 100 mg  100 mg Oral Daily Alysia Penna, MD   100 mg at 06/19/12 1044  . citalopram (CELEXA) tablet 20 mg  20 mg Oral Daily Alysia Penna, MD   20 mg at 06/19/12 1044  . colchicine tablet 0.6 mg  0.6 mg Oral Daily Jarome Matin, MD   0.6 mg at 06/19/12 1044  . folic acid (FOLVITE) tablet 0.5 mg  0.5 mg Oral Daily Jarome Matin, MD   0.5 mg at 06/19/12 1044   And  . vitamin B-12 (CYANOCOBALAMIN) tablet 1,000 mcg  1,000 mcg Oral Daily Jarome Matin, MD   1,000 mcg at 06/19/12 1044   And  . pyridOXINE (VITAMIN B-6) tablet 50 mg  50 mg Oral Daily Jarome Matin, MD   50 mg at 06/19/12 1044  .  HYDROcodone-acetaminophen (NORCO) 10-325 MG per tablet 1 tablet  1 tablet Oral Q6H PRN Jarome Matin, MD      . morphine (MS CONTIN) 12 hr tablet 30 mg  30 mg Oral BID Jarome Matin, MD   30 mg at 06/19/12 1044  . pantoprazole (PROTONIX) EC tablet 40 mg  40 mg Oral Q0600 Alysia Penna, MD   40 mg at 06/19/12 0806  . senna-docusate (Senokot-S) tablet 1 tablet  1 tablet Oral QHS PRN Jarome Matin, MD      . zolpidem [facility]) tablet 5 mg  5 mg Oral QHS PRN Jarome Matin, MD        Allergies as of 06/18/2012 - Review Complete 06/18/2012  Allergen Reaction Noted  . Fentanyl  03/08/2009  . Hydromorphone hcl  03/08/2009  . Sulfonamide derivatives     Review of Systems:    All systems reviewed and negative except where noted in HPI.   Physical Exam:  Vital signs in last 24 hours: Temp:  [98.2 F (36.8 C)-99.5 F (37.5 C)] 98.2 F (36.8 C) (06/06 1110) Pulse Rate:  [92-120] 99 (06/06 1110) Resp:  [16-26] 16 (06/06 1110) BP: (95-168)/(59-93) 161/70 mmHg (06/06 1110) SpO2:  [95 %-98 %] 95 % (06/06 0825) Weight:  [170 lb (77.111 kg)] 170 lb (77.111 kg) (06/06 0034) Last BM Date: 06/17/12 General:   White male in NAD Head:  Normocephalic and atraumatic. Eyes:   No icterus.   Conjunctiva pink. Ears:  Normal auditory acuity. Neck:  Supple; no masses felt Lungs:  Respirations even and unlabored. Lungs clear to auscultation bilaterally.   No wheezes, crackles, or rhonchi.  Heart:  Regular rate and rhythm Abdomen:  Soft, nondistended, nontender. Normal bowel sounds. No appreciable masses or hepatomegaly.  Rectal:  Not performed. Heme positive in ED  Msk:  Symmetrical without gross deformities.  Extremities:  Without edema. Neurologic:  Alert and  oriented x4;  grossly normal neurologically. Skin:  Intact without significant lesions or rashes. Cervical Nodes:  No significant cervical adenopathy. Psych:  Alert and cooperative. Normal affect.  LAB RESULTS:  Recent Labs   06/18/12 1700 06/19/12 0425  WBC 7.6 8.2  HGB 8.3* 8.1*  HCT 26.3* 25.8*  PLT 346 317   BMET  Recent Labs  06/18/12 1700 06/19/12 0425  NA 134* 135  K 4.7 4.7  CL 97 98  CO2 29 29  GLUCOSE 91 102*  BUN 31* 27*  CREATININE 1.64* 1.44*  CALCIUM 10.1 9.4   LFT  Recent Labs  06/19/12 0425  PROT 7.0  ALBUMIN 2.9*  AST 31  ALT 22  ALKPHOS 141*  BILITOT 0.3   PT/INR  Recent Labs  06/18/12 2131  LABPROT 15.6*  INR 1.27    PREVIOUS ENDOSCOPIES:            Colonoscopy (Dr. Jarold Motto) 2006. Normal to colon.    Impression / Plan:   55. 79 year old male with normocytic anemia (new). No overt bleeding but, heme positive stool. Hgb 11.7 six months ago, now at 8.1. Ferritin actually elevated at 1810. Anemia in setting of NSAID use though he has been taking NSAIDS longer than 6 months.  Rule erosive disease / PUD (NSAID and Fosamax use). Rule intestinal AMVs. Patient had a normal colonoscopy in 2006. For further evaluation patient needs EGD. If EGD negative he will likely need repeat colonoscopy. For now, continue clears and daily PPI. He is in process of blood transfusion now.  2. Needs home medication reconciliation. Some of patient's home meds not on list.  I alerted pharmacy  3. Chronic constipation which patient contributes to pain medications. He takes Miralax as needed with good results but is constipated at present.   4. History of melanoma to back approximately 2 years ago.  5. Atrial fibrillation on admission EKG. Will need to workup #1 prior to initiation of anticoagulation ( if needed)   4. Renal insufficiency, appears chronic. BUN and creatinine actually better than baseline.  5. Multiple medical problems including, but not limited to : gout/ depression / chronic pain requiring narcotics / diabetes  Thanks   LOS: 1 day   Willette Cluster  06/19/2012, 11:47 AM  Chart was reviewed and patient was examined. X-rays and lab were reviewed.    I agree with  management and plans.  Suspect he has bled related to NSAID use.  Will begin w/u with EGD, proceed with colonoscopy only if EGD is not diagnostic for a GI bleeding source.  Barbette Hair. Arlyce Dice, M.D., [facility] Cell [phone]

## 2012-06-19 NOTE — Evaluation (Signed)
Physical Therapy Evaluation Patient Details Name: Ricardo Allen MRN: [id number] DOB: 27-Feb-1925 Today's Date: 06/19/2012 Time: 4696-2952 PT Time Calculation (min): 21 min  PT Assessment / Plan / Recommendation Clinical Impression  Pt presents with anemia, fatigue and malaise (stool was positive for blood).  Tolerated OOB and ambulation in hallway very well with min/guard to min assist for LOB x 2 (able to resteady himself with some assist from therapist).  Pt will benefit from skilled PT in acute venue to address deficits.  PT recommends HHPT for follow up at pts wife is there at home with him and they both have life alert.     PT Assessment  Patient needs continued PT services    Follow Up Recommendations  Home health PT;Supervision for mobility/OOB    Does the patient have the potential to tolerate intense rehabilitation      Barriers to Discharge None      Equipment Recommendations  None recommended by PT    Recommendations for Other Services     Frequency Min 3X/week    Precautions / Restrictions Precautions Precautions: Fall Restrictions Weight Bearing Restrictions: No   Pertinent Vitals/Pain No pain      Mobility  Bed Mobility Bed Mobility: Supine to Sit Supine to Sit: 5: Supervision Details for Bed Mobility Assistance: Supervision for safety.  Transfers Transfers: Sit to Stand;Stand to Sit Sit to Stand: 5: Supervision;With upper extremity assist;From bed Stand to Sit: 5: Supervision;With upper extremity assist;With armrests;To chair/3-in-1 Details for Transfer Assistance: Supervision for safety with cues for hand placement, safety and keeping RW with him until all the way to recliner.  Ambulation/Gait Ambulation/Gait Assistance: 4: Min assist;4: Min Government social research officer (Feet): 500 Feet Assistive device: Rolling walker Ambulation/Gait Assistance Details: Pt mostly steady with RW, however did have some LOB x 2 during ambulation.  He was able to steady  himself with some assist from therapist.  cues for safety with RW and maintaining position inside of RW.  Gait Pattern: Step-through pattern;Decreased stride length Gait velocity: decreased Stairs: No Wheelchair Mobility Wheelchair Mobility: No    Exercises     PT Diagnosis: Difficulty walking;Abnormality of gait;Generalized weakness  PT Problem List: Decreased strength;Decreased activity tolerance;Decreased balance;Decreased mobility;Decreased coordination;Decreased knowledge of use of DME;Decreased safety awareness PT Treatment Interventions:     PT Goals Acute Rehab PT Goals PT Goal Formulation: With patient Time For Goal Achievement: 06/26/12 Potential to Achieve Goals: Good Pt will go Sit to Stand: with modified independence PT Goal: Sit to Stand - Progress: Goal set today Pt will go Stand to Sit: with modified independence PT Goal: Stand to Sit - Progress: Goal set today Pt will Ambulate: >150 feet;with modified independence;with least restrictive assistive device PT Goal: Ambulate - Progress: Goal set today Pt will Perform Home Exercise Program: with supervision, verbal cues required/provided PT Goal: Perform Home Exercise Program - Progress: Goal set today  Visit Information  Last PT Received On: 06/19/12 Assistance Needed: +1    Subjective Data  Subjective: Who ordered you all? Patient Stated Goal: to return home.    Prior Functioning  Home Living Lives With: Spouse Available Help at Discharge: Family;Available 24 hours/day (he and wife live at [facility]) Type of Home: Independent living facility Home Access: Level entry Home Layout: One level Bathroom Shower/Tub: Health visitor: Handicapped height Home Adaptive Equipment: Straight cane;Walker - rolling Prior Function Level of Independence: Independent with assistive device(s) Vocation: Retired Comments: Pt states they also have a life alert that works  from anywhere.   Communication Communication: No difficulties    Cognition  Cognition Arousal/Alertness: Awake/alert Behavior During Therapy: WFL for tasks assessed/performed Overall Cognitive Status: Within Functional Limits for tasks assessed    Extremity/Trunk Assessment Right Lower Extremity Assessment RLE ROM/Strength/Tone: WFL for tasks assessed RLE Sensation: WFL - Light Touch Left Lower Extremity Assessment LLE ROM/Strength/Tone: WFL for tasks assessed LLE Sensation: WFL - Light Touch Trunk Assessment Trunk Assessment: Normal   Balance    End of Session PT - End of Session Equipment Utilized During Treatment: Gait belt Activity Tolerance: Patient tolerated treatment well Patient left: in chair;with call bell/phone within reach Nurse Communication: Mobility status  GP     Vista Deck 06/19/2012, 3:33 PM

## 2012-06-20 ENCOUNTER — Encounter (HOSPITAL_COMMUNITY)
Admission: EM | Disposition: A | Discharge status: Home Health | Payer: Self-pay | Source: Home / Self Care | Attending: Internal Medicine

## 2012-06-20 ENCOUNTER — Encounter (HOSPITAL_COMMUNITY): Payer: Self-pay | Admitting: *Deleted

## 2012-06-20 HISTORY — PX: ESOPHAGOGASTRODUODENOSCOPY: SHX5428

## 2012-06-20 LAB — BASIC METABOLIC PANEL
Chloride: 99 mEq/L (ref 96–112)
GFR calc Af Amer: 63 mL/min — ABNORMAL LOW (ref >90)
GFR calc non Af Amer: 54 mL/min — ABNORMAL LOW (ref >90)
Potassium: 4.1 mEq/L (ref 3.5–5.1)
Sodium: 134 mEq/L — ABNORMAL LOW (ref 135–145)

## 2012-06-20 LAB — TYPE AND SCREEN
Antibody Screen: NEGATIVE
Unit division: 0

## 2012-06-20 LAB — CBC
HCT: 31.2 % — ABNORMAL LOW (ref 39.0–52.0)
MCHC: 31.4 g/dL (ref 30.0–36.0)
Platelets: 283 10*3/uL (ref 150–400)
RDW: 15.7 % — ABNORMAL HIGH (ref 11.5–15.5)
WBC: 8.2 10*3/uL (ref 4.0–10.5)

## 2012-06-20 SURGERY — EGD (ESOPHAGOGASTRODUODENOSCOPY)
Anesthesia: Moderate Sedation

## 2012-06-20 MED ORDER — MEPERIDINE HCL 25 MG/ML IJ SOLN
INTRAMUSCULAR | Status: DC | PRN
  Administered 2012-06-20: 25 mg via INTRAVENOUS

## 2012-06-20 MED ORDER — MIDAZOLAM HCL 10 MG/2ML IJ SOLN
INTRAMUSCULAR | Status: DC | PRN
  Administered 2012-06-20: 1 mg via INTRAVENOUS
  Administered 2012-06-20: 2 mg via INTRAVENOUS

## 2012-06-20 MED ORDER — PEG-KCL-NACL-NASULF-NA ASC-C 100 G PO SOLR
0.5000 | Freq: Once | ORAL | Status: AC
  Administered 2012-06-21: 50 g via ORAL
  Filled 2012-06-20: qty 1

## 2012-06-20 MED ORDER — METOCLOPRAMIDE HCL 10 MG PO TABS
10.0000 mg | ORAL_TABLET | Freq: Once | ORAL | Status: AC
  Administered 2012-06-20: 10 mg via ORAL
  Filled 2012-06-20: qty 1

## 2012-06-20 MED ORDER — BUTAMBEN-TETRACAINE-BENZOCAINE 2-2-14 % EX AERO
INHALATION_SPRAY | CUTANEOUS | Status: DC | PRN
  Administered 2012-06-20: 1 via TOPICAL

## 2012-06-20 MED ORDER — METOPROLOL TARTRATE 25 MG PO TABS
25.0000 mg | ORAL_TABLET | Freq: Two times a day (BID) | ORAL | Status: DC
  Administered 2012-06-20 – 2012-06-23 (×6): 25 mg via ORAL
  Filled 2012-06-20 (×7): qty 1

## 2012-06-20 MED ORDER — PEG-KCL-NACL-NASULF-NA ASC-C 100 G PO SOLR: 1.0000 | Freq: Once | ORAL | Status: DC

## 2012-06-20 MED ORDER — PEG-KCL-NACL-NASULF-NA ASC-C 100 G PO SOLR
0.5000 | Freq: Once | ORAL | Status: AC
  Administered 2012-06-20: 50 g via ORAL
  Filled 2012-06-20: qty 1

## 2012-06-20 MED ORDER — SODIUM CHLORIDE 0.9 % IV SOLN
INTRAVENOUS | Status: DC
  Administered 2012-06-20 – 2012-06-21 (×2): via INTRAVENOUS

## 2012-06-20 MED ORDER — DIPHENHYDRAMINE HCL 50 MG/ML IJ SOLN
INTRAMUSCULAR | Status: DC | PRN
  Administered 2012-06-20: 25 mg via INTRAVENOUS

## 2012-06-20 NOTE — Progress Notes (Signed)
Subjective:  Had BM-hard brown stool per nurse. Abdominal discomfort resolved. Normal TSH. Sinus rhythm continues. Afebrile. Normal EGD. Awaiting Colonoscopy.   Objective:  Vital Signs in the last 24 hours: Temp:  [97.4 F (36.3 C)-98.3 F (36.8 C)] 97.4 F (36.3 C) (06/07 1321) Pulse Rate:  [79-96] 83 (06/07 1321) Cardiac Rhythm:  [-] Normal sinus rhythm (06/07 0746) Resp:  [13-25] 20 (06/07 1321) BP: (139-174)/(72-98) 148/72 mmHg (06/07 1321) SpO2:  [96 %-100 %] 99 % (06/07 1321)  Physical Exam: BP Readings from Last 1 Encounters:  06/20/12 148/72     Wt Readings from Last 1 Encounters:  06/19/12 77.111 kg (170 lb)    Weight change:   HEENT: [place]/AT, Eyes-Hazel, PERL, EOMI, Conjunctiva-Pale pink, Sclera-Non-icteric Neck: No JVD, No bruit, Trachea midline. Lungs:  Clear, Bilateral. Cardiac:  Regular rhythm, normal S1 and S2, no S3.  Abdomen:  Soft, non-tender. Extremities:  No edema present. No cyanosis. No clubbing. CNS: AxOx2, Cranial nerves grossly intact, moves all 4 extremities. Right handed. Skin: Warm and dry.   Intake/Output from previous day: 06/06 0701 - 06/07 0700 In: 1851.3 [P.O.:120; I.V.:1381.3; Blood:350] Out: 2400 [Urine:2400]    Lab Results: BMET    Component Value Date/Time   NA 134* 06/20/2012 0530   K 4.1 06/20/2012 0530   CL 99 06/20/2012 0530   CO2 26 06/20/2012 0530   GLUCOSE 96 06/20/2012 0530   BUN 17 06/20/2012 0530   CREATININE 1.19 06/20/2012 0530   CALCIUM 9.6 06/20/2012 0530   GFRNONAA 54* 06/20/2012 0530   GFRAA 63* 06/20/2012 0530   CBC    Component Value Date/Time   WBC 8.2 06/20/2012 0530   RBC 3.52* 06/20/2012 0530   HGB 9.8* 06/20/2012 0530   HCT 31.2* 06/20/2012 0530   PLT 283 06/20/2012 0530   MCV 88.6 06/20/2012 0530   MCH 27.8 06/20/2012 0530   MCHC 31.4 06/20/2012 0530   RDW 15.7* 06/20/2012 0530   LYMPHSABS 1.5 06/18/2012 1700   MONOABS 1.4* 06/18/2012 1700   EOSABS 1.0* 06/18/2012 1700   BASOSABS 0.1 06/18/2012 1700   CARDIAC ENZYMES Lab Results   Component Value Date   CKTOTAL 20 09/17/2009   CKMB 0.9 09/17/2009   TROPONINI <0.30 06/18/2012    Scheduled Meds: . allopurinol  100 mg Oral Daily  . amiodarone  200 mg Oral Daily  . citalopram  20 mg Oral Daily  . colchicine  0.6 mg Oral Daily  . docusate sodium  100 mg Oral Daily  . folic acid  0.5 mg Oral Daily   And  . vitamin B-12  1,000 mcg Oral Daily   And  . vitamin B-6  50 mg Oral Daily  . morphine  30 mg Oral BID  . pantoprazole  40 mg Oral Q0600  . [START ON 06/21/2012] peg 3350 powder  0.5 kit Oral Once  . polyethylene glycol  17 g Oral Daily   Continuous Infusions: . [START ON 06/22/2012] sodium chloride    . sodium chloride 30 mL/hr at 06/20/12 0851  . sodium chloride 20 mL/hr at 06/20/12 1324   PRN Meds:.HYDROcodone-acetaminophen, senna-docusate, zolpidem  Assessment/Plan: Status post paroxysmal atrial fibrillation  Status post probable upper GI bleeds secondary to NSAID  History of paroxysmal atrial fib in the past  History of Spinal stenosis  History of scoliosis  History of melanoma  Depression  Tobacco abuse  Not a candidate for blood thinners until stable and no overt GI lesion Increase activity as tolerated.   LOS: 2  days    Orpah Cobb  MD  06/20/2012, 8:35 PM

## 2012-06-20 NOTE — Op Note (Signed)
[facility] [address] [place], [address]   ENDOSCOPY PROCEDURE REPORT  PATIENT: Ricardo Allen, Ricardo Allen  MR#: [id number] BIRTHDATE: 17-Mar-1925 , 86  yrs. old GENDER: Male ENDOSCOPIST: Louis Meckel, MD REFERRED BY:  Creola Corn, M.D. PROCEDURE DATE:  06/20/2012 PROCEDURE:  EGD, diagnostic ASA CLASS:     Class II INDICATIONS:  Anemia.   Heme positive stool. MEDICATIONS: These medications were titrated to patient response per physician's verbal order, Benadryl 25 mg IV, Versed 2 mg IV, and Demerol 25 mg IV TOPICAL ANESTHETIC: Cetacaine Spray  DESCRIPTION OF PROCEDURE: After the risks benefits and alternatives of the procedure were thoroughly explained, informed consent was obtained.  The PENTAX GASTOROSCOPE [id number] endoscope was introduced through the mouth and advanced to the third portion of the duodenum. Without limitations.  The instrument was slowly withdrawn as the mucosa was fully examined.      The upper, middle and distal third of the esophagus were carefully inspected and no abnormalities were noted.  The z-line was well seen at the GEJ.  The endoscope was pushed into the fundus which was normal including a retroflexed view.  The antrum, gastric body, first and second part of the duodenum were unremarkable. Retroflexed views revealed no abnormalities.     The scope was then withdrawn from the patient and the procedure completed.  COMPLICATIONS: There were no complications. ENDOSCOPIC IMPRESSION: Normal EGD  RECOMMENDATIONS: colonoscopy  REPEAT EXAM:  eSigned:  Louis Meckel, MD 06/20/2012 12:20 PM   CC:

## 2012-06-20 NOTE — Interval H&P Note (Signed)
History and Physical Interval Note:  06/20/2012 11:57 AM  Ricardo Allen  has presented today for surgery, with the diagnosis of anemia, heme positive stools  The various methods of treatment have been discussed with the patient and family. After consideration of risks, benefits and other options for treatment, the patient has consented to  Procedure(s): ESOPHAGOGASTRODUODENOSCOPY (EGD) (N/A) as a surgical intervention .  The patient's history has been reviewed, patient examined, no change in status, stable for surgery.  I have reviewed the patient's chart and labs.  Questions were answered to the patient's satisfaction.    The recent H&P (dated **06/19/12*) was reviewed, the patient was examined and there is no change in the patients condition since that H&P was completed.   Ricardo Allen  06/20/2012, 11:57 AM   Melvia Heaps

## 2012-06-20 NOTE — Progress Notes (Signed)
Subjective: Admitted c Sxatic anemia, heme + stols and newly noted AFib (although some of "Anemia" Sxs may have in fact been Afib related.  Appreciate GI and Cards input.  S/P PRBCs.  Feels stronger post transfusion. No new c/o No CP/SOB or palpitations.    Objective: Vital signs in last 24 hours: Temp:  [98.2 F (36.8 C)-99.5 F (37.5 C)] 98.3 F (36.8 C) (06/07 0530) Pulse Rate:  [89-99] 92 (06/07 0530) Resp:  [16-19] 18 (06/07 0530) BP: (137-166)/(60-78) 166/77 mmHg (06/07 0530) SpO2:  [95 %-100 %] 98 % (06/07 0530) Weight change:  Last BM Date: 06/17/12  CBG (last 3)  No results found for this basename: GLUCAP,  in the last 72 hours  Intake/Output from previous day:  Intake/Output Summary (Last 24 hours) at 06/20/12 0759 Last data filed at 06/20/12 0700  Gross per 24 hour  Intake 1851.25 ml  Output   2400 ml  Net -548.75 ml   06/06 0701 - 06/07 0700 In: 1851.3 [P.O.:120; I.V.:1381.3; Blood:350] Out: 2400 [Urine:2400]   Physical Exam Gen: elderly male in NAD  HEENT:EOMI, OP moist Cards: reg rhythm no MRG  Pulm: no rales, rhonchi, wheezes  Abd: nontender, nondistended, normal BS  Ext: 4/5 LE strength, full ROM, otherwise normal  Neuro: AAOx4, no focal deficits     Lab Results:  Recent Labs  06/19/12 0425 06/20/12 0530  NA 135 134*  K 4.7 4.1  CL 98 99  CO2 29 26  GLUCOSE 102* 96  BUN 27* 17  CREATININE 1.44* 1.19  CALCIUM 9.4 9.6     Recent Labs  06/18/12 1700 06/19/12 0425  AST 38* 31  ALT 26 22  ALKPHOS 151* 141*  BILITOT 0.3 0.3  PROT 7.8 7.0  ALBUMIN 3.3* 2.9*     Recent Labs  06/18/12 1700  06/19/12 1700 06/20/12 0530  WBC 7.6  < > 8.0 8.2  NEUTROABS 3.6  --   --   --   HGB 8.3*  < > 9.6* 9.8*  HCT 26.3*  < > 29.8* 31.2*  MCV 90.1  < > 88.2 88.6  PLT 346  < > 309 283  < > = values in this interval not displayed.  Lab Results  Component Value Date   INR 1.27 06/18/2012   INR 1.23 07/29/2009   INR 1.24 07/28/2009      Recent Labs  06/18/12 1700  TROPONINI <0.30     Recent Labs  06/19/12 0755  TSH 1.315     Recent Labs  06/19/12 0425  VITAMINB12 736  FOLATE 18.7  FERRITIN 1810*  TIBC 256  IRON 24*  RETICCTPCT 4.2*    Micro Results: No results found for this or any previous visit (from the past 240 hour(s)).   Studies/Results: No results found.   Medications: Scheduled: . allopurinol  100 mg Oral Daily  . amiodarone  200 mg Oral Daily  . citalopram  20 mg Oral Daily  . colchicine  0.6 mg Oral Daily  . docusate sodium  100 mg Oral Daily  . folic acid  0.5 mg Oral Daily   And  . vitamin B-12  1,000 mcg Oral Daily   And  . vitamin B-6  50 mg Oral Daily  . morphine  30 mg Oral BID  . pantoprazole  40 mg Oral Q0600  . polyethylene glycol  17 g Oral Daily   Continuous: . [START ON 06/22/2012] sodium chloride    . sodium chloride 75 mL/hr at 06/20/12 0013  Assessment/Plan: Principal Problem:   Anemia Active Problems:   Atrial fibrillation   GI bleed  Sxatic Iron Def Anemia. - Hgb excellent at 9.8 post PRBC Transfusion. Continue PPI.  For EGD to rule out erosive disease / PUD (NSAID and Fosamax use). Rule intestinal AVMs. Patient had a normal colonoscopy in 2006.  If EGD negative he will likely need repeat colonoscopy. For now, continue clears.  The pt is in need of chronic lifelong anticoag in setting of new onset a fib so depending on GI and scopes as to when he can start them  Atrial fibrillation - Noted on EKG in ED. Now NSR on Monitor.  Appreciate Cards consult.  Now on Amio.  Await 2D ECHO to be read.  GI bleed - heme POS stools on DRE w/ symptomatic anemia. S/P appropriate Hgb response to 1U PRBC  Chronic constipation contributed to pain medications. He takes Miralax as needed with good results but is constipated at present.   CKD 3. BUN and creatinine actually better than baseline.  DM2 - listed as Dx but glucose 95-1205 on labs and no issues.  ?diet  controlledOsteopenia - holding Fosamax while in house  Joint pain - cont MS contin  Depression - cont home celexa  FEN -  Reduce IVFs Dispo - pending stability of Hgb . Back to [facility] when stable   DVT Prophylaxis - Squeezers    LOS: 2 days   Itsel Opfer M 06/20/2012, 7:59 AM

## 2012-06-20 NOTE — H&P (View-Only) (Signed)
[facility] Consultation  Referring Provider:     Scott Holwerda, MD Primary Care Physician:  Scott Holwerda, MD Primary Gastroenterologist:   David Patterson, MD      Reason for Consultation:    Anemia          HPI:   Ricardo Allen is a 79 y.o. male recently seen by PCP for fatigue. He was found to be anemic with a hgb of 8.6, down from 11.7 six months prior. He was scheduled to have an outpatient transfusion on Monday be wife brought him to ED overnight with worsening symptoms. In ED hgb was 8.3, stools were heme positive, BP stable but he was hypotensive. Hgb 8.1 today. He takes chronic NSAIDS, no PPIs. No abdominal pain or nausea. No GERD symptoms. He feels okay.  Past Medical History  Diagnosis Date  . ANEMIA NOS 08/19/2006  . Atrial fibrillation 11/27/2009  . DEMENTIA 08/27/2006  . DEPRESSION 08/27/2006  . DISORDER, LUMBAR DISC W/MYELOPATHY 08/19/2006  . DM W/MANIFESTATION NEC, TYPE II, UNCONTROLLED 08/27/2006  . GOUT 03/22/2009  . HYPERLIPIDEMIA 08/27/2006  . HYPOTENSION, ORTHOSTATIC 10/31/2009  . Impaired fasting glucose 01/27/2007  . LOW BACK PAIN 02/03/2007  . LUMBAR RADICULOPATHY, LEFT 10/09/2006  . LUNG NODULE 03/29/2008  . MELANOMA, MALIGNANT, TRUNK 08/19/2006  . OBSTRUCTIVE SLEEP APNEA 08/19/2006  . TRAUMATIC ARTHROPATHY PELVIC REGION AND THIGH 06/29/2009  . TOBACCO ABUSE 12/31/2007  . Wet senile macular degeneration     Past Surgical History  Procedure Laterality Date  . Cataract extraction    . Lumbar laminectomy    . Lumbar fusion    . Tonsilectomy, adenoidectomy, bilateral myringotomy and tubes    . Melanoma excision      left lower back    Family History  Problem Relation Age of Onset  . Pancreatic cancer Mother   . Cancer Mother   . Testicular cancer Father   . Cancer Father   . Arthritis Brother    No colon cancer  History  Substance Use Topics  . Smoking status: Former Smoker -- 2.00 packs/day    Types: Cigarettes    Quit date: 04/14/2008  .  Smokeless tobacco: Not on file  . Alcohol Use: 1.8 oz/week    3 Glasses of wine per week    Prior to Admission medications   Medication Sig Start Date End Date Taking? Authorizing Provider  citalopram (CELEXA) 40 MG tablet TAKE ONE TABLET BY MOUTH ONCE DAILY 05/14/11  Yes John E Jenkins, MD  Cobalamine Combinations (B-6 FOLIC ACID) 400-1000-50 MCG-MCG-MG CAPS Take 1 capsule by mouth daily. 06/22/10  Yes John E Jenkins, MD  Colchicine (COLCRYS PO) Take by mouth daily.     Yes Historical Provider, MD  HYDROcodone-acetaminophen (NORCO) 10-325 MG per tablet Take 1 tablet by mouth every 6 (six) hours as needed for pain.   Yes Historical Provider, MD  Misc. Devices (STEP N REST II WALKER) MISC Walker with seat for rest 06/22/10  Yes John E Jenkins, MD  morphine (MS CONTIN) 30 MG 12 hr tablet Take 30 mg by mouth 2 (two) times daily.     Yes Historical Provider, MD  psyllium (METAMUCIL) 58.6 % packet Take 1 packet by mouth daily.     Yes Historical Provider, MD    Current Facility-Administered Medications  Medication Dose Route Frequency Provider Last Rate Last Dose  . [START ON 06/22/2012] 0.9 %  sodium chloride infusion   Intravenous Continuous Scott Holwerda, MD      . 0.9 %    sodium chloride infusion   Intravenous Continuous Scott Holwerda, MD 75 mL/hr at 06/19/12 0815    . allopurinol (ZYLOPRIM) tablet 100 mg  100 mg Oral Daily Scott Holwerda, MD   100 mg at 06/19/12 1044  . citalopram (CELEXA) tablet 20 mg  20 mg Oral Daily Scott Holwerda, MD   20 mg at 06/19/12 1044  . colchicine tablet 0.6 mg  0.6 mg Oral Daily Daniel Paterson, MD   0.6 mg at 06/19/12 1044  . folic acid (FOLVITE) tablet 0.5 mg  0.5 mg Oral Daily Daniel Paterson, MD   0.5 mg at 06/19/12 1044   And  . vitamin B-12 (CYANOCOBALAMIN) tablet 1,000 mcg  1,000 mcg Oral Daily Daniel Paterson, MD   1,000 mcg at 06/19/12 1044   And  . pyridOXINE (VITAMIN B-6) tablet 50 mg  50 mg Oral Daily Daniel Paterson, MD   50 mg at 06/19/12 1044  .  HYDROcodone-acetaminophen (NORCO) 10-325 MG per tablet 1 tablet  1 tablet Oral Q6H PRN Daniel Paterson, MD      . morphine (MS CONTIN) 12 hr tablet 30 mg  30 mg Oral BID Daniel Paterson, MD   30 mg at 06/19/12 1044  . pantoprazole (PROTONIX) EC tablet 40 mg  40 mg Oral Q0600 Scott Holwerda, MD   40 mg at 06/19/12 0806  . senna-docusate (Senokot-S) tablet 1 tablet  1 tablet Oral QHS PRN Daniel Paterson, MD      . zolpidem (AMBIEN) tablet 5 mg  5 mg Oral QHS PRN Daniel Paterson, MD        Allergies as of 06/18/2012 - Review Complete 06/18/2012  Allergen Reaction Noted  . Fentanyl  03/08/2009  . Hydromorphone hcl  03/08/2009  . Sulfonamide derivatives     Review of Systems:    All systems reviewed and negative except where noted in HPI.   Physical Exam:  Vital signs in last 24 hours: Temp:  [98.2 F (36.8 C)-99.5 F (37.5 C)] 98.2 F (36.8 C) (06/06 1110) Pulse Rate:  [92-120] 99 (06/06 1110) Resp:  [16-26] 16 (06/06 1110) BP: (95-168)/(59-93) 161/70 mmHg (06/06 1110) SpO2:  [95 %-98 %] 95 % (06/06 0825) Weight:  [170 lb (77.111 kg)] 170 lb (77.111 kg) (06/06 0034) Last BM Date: 06/17/12 General:   White male in NAD Head:  Normocephalic and atraumatic. Eyes:   No icterus.   Conjunctiva pink. Ears:  Normal auditory acuity. Neck:  Supple; no masses felt Lungs:  Respirations even and unlabored. Lungs clear to auscultation bilaterally.   No wheezes, crackles, or rhonchi.  Heart:  Regular rate and rhythm Abdomen:  Soft, nondistended, nontender. Normal bowel sounds. No appreciable masses or hepatomegaly.  Rectal:  Not performed. Heme positive in ED  Msk:  Symmetrical without gross deformities.  Extremities:  Without edema. Neurologic:  Alert and  oriented x4;  grossly normal neurologically. Skin:  Intact without significant lesions or rashes. Cervical Nodes:  No significant cervical adenopathy. Psych:  Alert and cooperative. Normal affect.  LAB RESULTS:  Recent Labs   06/18/12 1700 06/19/12 0425  WBC 7.6 8.2  HGB 8.3* 8.1*  HCT 26.3* 25.8*  PLT 346 317   BMET  Recent Labs  06/18/12 1700 06/19/12 0425  NA 134* 135  K 4.7 4.7  CL 97 98  CO2 29 29  GLUCOSE 91 102*  BUN 31* 27*  CREATININE 1.64* 1.44*  CALCIUM 10.1 9.4   LFT  Recent Labs  06/19/12 0425  PROT 7.0  ALBUMIN 2.9*    AST 31  ALT 22  ALKPHOS 141*  BILITOT 0.3   PT/INR  Recent Labs  06/18/12 2131  LABPROT 15.6*  INR 1.27    PREVIOUS ENDOSCOPIES:            Colonoscopy (Dr. Patterson) 2006. Normal to colon.    Impression / Plan:   1. 79 year old male with normocytic anemia (new). No overt bleeding but, heme positive stool. Hgb 11.7 six months ago, now at 8.1. Ferritin actually elevated at 1810. Anemia in setting of NSAID use though he has been taking NSAIDS longer than 6 months.  Rule erosive disease / PUD (NSAID and Fosamax use). Rule intestinal AMVs. Patient had a normal colonoscopy in 2006. For further evaluation patient needs EGD. If EGD negative he will likely need repeat colonoscopy. For now, continue clears and daily PPI. He is in process of blood transfusion now.  2. Needs home medication reconciliation. Some of patient's home meds not on list.  I alerted pharmacy  3. Chronic constipation which patient contributes to pain medications. He takes Miralax as needed with good results but is constipated at present.   4. History of melanoma to back approximately 2 years ago.  5. Atrial fibrillation on admission EKG. Will need to workup #1 prior to initiation of anticoagulation ( if needed)   4. Renal insufficiency, appears chronic. BUN and creatinine actually better than baseline.  5. Multiple medical problems including, but not limited to : gout/ depression / chronic pain requiring narcotics / diabetes  Thanks   LOS: 1 day   Paula Guenther  06/19/2012, 11:47 AM  Chart was reviewed and patient was examined. X-rays and lab were reviewed.    I agree with  management and plans.  Suspect he has bled related to NSAID use.  Will begin w/u with EGD, proceed with colonoscopy only if EGD is not diagnostic for a GI bleeding source.  Mariah D. Charnae Lill, M.D., FACG [facility] Cell [phone]      

## 2012-06-20 NOTE — Care Management Note (Signed)
    Page 1 of 1   06/20/2012     2:16:46 PM   CARE MANAGEMENT NOTE 06/20/2012  Patient:  Ricardo Allen, Ricardo Allen   Account Number:  [id number]  Date Initiated:  06/20/2012  Documentation initiated by:  Lanier Clam  Subjective/Objective Assessment:   ADMITTED W/ANEMIA.     Action/Plan:   FROM [facility].   Anticipated DC Date:  06/23/2012   Anticipated DC Plan:  HOME/SELF CARE      DC Planning Services  CM consult      Choice offered to / List presented to:  C-1 Patient           Status of service:  In process, will continue to follow Medicare Important Message given?   (If response is "NO", the following Medicare IM given date fields will be blank) Date Medicare IM given:   Date Additional Medicare IM given:    Discharge Disposition:    Per UR Regulation:    If discussed at Long Length of Stay Meetings, dates discussed:    Comments:  06/20/12 Tasheem Elms RN,BSN NCM WEEKEND [phone] PT-HH.GENTIVA LORI REP FOLLOWING FOR HHPT.PATIENT IN AGREEMENT.

## 2012-06-20 NOTE — Progress Notes (Signed)
Physical Therapy Treatment Patient Details Name: Ricardo Allen MRN: [id number] DOB: 07-04-1925 Today's Date: 06/20/2012 Time: 4098-1191 PT Time Calculation (min): 20 min  PT Assessment / Plan / Recommendation Comments on Treatment Session  Pt progressing with ambulation and demos better stability today, however still requires min/guard for safety/steadying.      Follow Up Recommendations  Home health PT;Supervision for mobility/OOB     Does the patient have the potential to tolerate intense rehabilitation     Barriers to Discharge        Equipment Recommendations  None recommended by PT    Recommendations for Other Services    Frequency Min 3X/week   Plan Discharge plan remains appropriate    Precautions / Restrictions Precautions Precautions: Fall   Pertinent Vitals/Pain No pain    Mobility  Bed Mobility Bed Mobility: Supine to Sit Supine to Sit: 5: Supervision Details for Bed Mobility Assistance: Supervision for safety.  Transfers Transfers: Sit to Stand;Stand to Sit Sit to Stand: 5: Supervision;With upper extremity assist;From bed Stand to Sit: 5: Supervision;With upper extremity assist;With armrests;To chair/3-in-1 Details for Transfer Assistance: Min cues for hand placement and safety.  Ambulation/Gait Ambulation/Gait Assistance: 4: Min guard Ambulation Distance (Feet): 250 Feet Assistive device: Rolling walker Ambulation/Gait Assistance Details: Pt demos better balance when ambulating today, however still requires min/guard for safety.  Also cues for maintaining position inside of RW and upright posture.  Note that pt fatigued quickly and became anxious about getting back to room.  Gait Pattern: Step-through pattern;Decreased stride length Gait velocity: decreased    Exercises General Exercises - Lower Extremity Ankle Circles/Pumps: AROM;Both;20 reps Quad Sets: Strengthening;Both;10 reps Heel Slides: Strengthening;Both;10 reps Hip ABduction/ADduction:  Strengthening;Both;10 reps (ADD w/ pillow squeeze) Straight Leg Raises: AAROM;Both;10 reps   PT Diagnosis:    PT Problem List:   PT Treatment Interventions:     PT Goals Acute Rehab PT Goals PT Goal Formulation: With patient Time For Goal Achievement: 06/26/12 Potential to Achieve Goals: Good Pt will go Sit to Stand: with modified independence PT Goal: Sit to Stand - Progress: Progressing toward goal Pt will go Stand to Sit: with modified independence PT Goal: Stand to Sit - Progress: Progressing toward goal Pt will Ambulate: >150 feet;with modified independence;with least restrictive assistive device PT Goal: Ambulate - Progress: Progressing toward goal Pt will Perform Home Exercise Program: with supervision, verbal cues required/provided PT Goal: Perform Home Exercise Program - Progress: Progressing toward goal  Visit Information  Last PT Received On: 06/20/12 Assistance Needed: +1    Subjective Data  Subjective: I am feeling better today Patient Stated Goal: to return home.    Cognition  Cognition Arousal/Alertness: Awake/alert Behavior During Therapy: WFL for tasks assessed/performed Overall Cognitive Status: Within Functional Limits for tasks assessed    Balance     End of Session PT - End of Session Activity Tolerance: Patient tolerated treatment well Patient left: in chair;with call bell/phone within reach Nurse Communication: Mobility status   GP     Vista Deck 06/20/2012, 4:29 PM

## 2012-06-21 ENCOUNTER — Encounter (HOSPITAL_COMMUNITY): Payer: Self-pay | Admitting: *Deleted

## 2012-06-21 ENCOUNTER — Encounter (HOSPITAL_COMMUNITY)
Admission: EM | Disposition: A | Discharge status: Home Health | Payer: Self-pay | Source: Home / Self Care | Attending: Internal Medicine

## 2012-06-21 DIAGNOSIS — K922 Gastrointestinal hemorrhage, unspecified: Principal | ICD-10-CM

## 2012-06-21 DIAGNOSIS — K5730 Diverticulosis of large intestine without perforation or abscess without bleeding: Secondary | ICD-10-CM

## 2012-06-21 DIAGNOSIS — D649 Anemia, unspecified: Secondary | ICD-10-CM

## 2012-06-21 HISTORY — PX: COLONOSCOPY: SHX5424

## 2012-06-21 LAB — CBC
MCHC: 31.6 g/dL (ref 30.0–36.0)
RDW: 15.6 % — ABNORMAL HIGH (ref 11.5–15.5)

## 2012-06-21 LAB — BASIC METABOLIC PANEL
GFR calc Af Amer: 55 mL/min — ABNORMAL LOW (ref >90)
GFR calc non Af Amer: 48 mL/min — ABNORMAL LOW (ref >90)
Potassium: 4.1 mEq/L (ref 3.5–5.1)
Sodium: 138 mEq/L (ref 135–145)

## 2012-06-21 SURGERY — COLONOSCOPY
Anesthesia: Moderate Sedation

## 2012-06-21 MED ORDER — MEPERIDINE HCL 25 MG/ML IJ SOLN
INTRAMUSCULAR | Status: DC | PRN
  Administered 2012-06-21: 25 mg via INTRAVENOUS

## 2012-06-21 MED ORDER — POLYSACCHARIDE IRON COMPLEX 150 MG PO CAPS
150.0000 mg | ORAL_CAPSULE | Freq: Every day | ORAL | Status: DC
  Administered 2012-06-21 – 2012-06-23 (×3): 150 mg via ORAL
  Filled 2012-06-21 (×3): qty 1

## 2012-06-21 MED ORDER — MIDAZOLAM HCL 5 MG/5ML IJ SOLN
INTRAMUSCULAR | Status: DC | PRN
  Administered 2012-06-21: 2.5 mg via INTRAVENOUS

## 2012-06-21 NOTE — Progress Notes (Signed)
Patient's cardiac rhythm showed 34 beats of fast rhythm.  QRS morphology different (poss due to rate), but length the same as in sinus rhythm.  Probably rapid afib or a flutter.  Pt denied any symptoms.  Dr. Algie Coffer notified.  Order received for beta blocker.  Berton Bon RN-BC

## 2012-06-21 NOTE — Interval H&P Note (Signed)
History and Physical Interval Note:  06/21/2012 9:07 AM  Ricardo Allen  has presented today for surgery, with the diagnosis of anemia  The various methods of treatment have been discussed with the patient and family. After consideration of risks, benefits and other options for treatment, the patient has consented to  Procedure(s): COLONOSCOPY (N/A) as a surgical intervention .  The patient's history has been reviewed, patient examined, no change in status, stable for surgery.  I have reviewed the patient's chart and labs.  Questions were answered to the patient's satisfaction.     The recent H&P (dated *06/19/12**) was reviewed, the patient was examined and there is no change in the patients condition since that H&P was completed.   Ricardo Allen  06/21/2012, 9:07 AM   Ricardo Allen

## 2012-06-21 NOTE — Op Note (Signed)
[facility] [address] [place], [address]   COLONOSCOPY PROCEDURE REPORT  PATIENT: Ricardo Allen, Ricardo Allen  MR#: [id number] BIRTHDATE: [birth year] , 86  yrs. old GENDER: Male ENDOSCOPIST: Louis Meckel, MD REFERRED AO:ZHYQ Carolynn Sayers, M.D. PROCEDURE DATE:  06/21/2012 PROCEDURE:   Colonoscopy, diagnostic ASA CLASS:   Class II INDICATIONS:Anemia, non-specific and heme-positive stool. MEDICATIONS: These medications were titrated to patient response per physician's verbal order, Demerol 25 mg IV, Versed, and Versed-Detailed 2.5 mg IV  DESCRIPTION OF PROCEDURE:   After the risks benefits and alternatives of the procedure were thoroughly explained, informed consent was obtained.  A digital rectal exam revealed no abnormalities of the rectum.   The Pentax Colonoscope [id number] endoscope was introduced through the anus and advanced to the cecum, which was identified by both the appendix and ileocecal valve. No adverse events experienced.   The quality of the prep was excellent, using MoviPrep  The instrument was then slowly withdrawn as the colon was fully examined.      COLON FINDINGS: Moderate diverticulosis was noted in the sigmoid colon.   The colon mucosa was otherwise normal.  Retroflexed views revealed no abnormalities. The time to cecum=  .  Withdrawal time=7 minutes 0 seconds.  The scope was withdrawn and the procedure completed. COMPLICATIONS: There were no complications.  ENDOSCOPIC IMPRESSION: 1.   Moderate diverticulosis was noted in the sigmoid colon 2.   The colon mucosa was otherwise normal  RECOMMENDATIONS: f/u hemeoccults in 1 week; if positive proceed with capsule endoscopy  eSigned:  Louis Meckel, MD 06/21/2012 9:35 AM   cc:

## 2012-06-21 NOTE — Progress Notes (Addendum)
Upper endoscopy was normal. Diverticulosis was seen at colonoscopy.  There was no fresh or old blood.  I suspect that GI bleeding was related to NSAID use. He could have ulcerations in the small bowel.  Recommendations #1 followup Hemoccults in approximately one week; if still positive will proceed with capsule endoscopy. If negative no further GI workup. #2 begin iron supplementation #3 OK to begin anticoagulation Rx  Signing off.

## 2012-06-21 NOTE — Progress Notes (Signed)
Subjective:  Feeling better. Colonoscopy without source of bleeding.  Objective:  Vital Signs in the last 24 hours: Temp:  [97.4 F (36.3 C)-98.5 F (36.9 C)] 98 F (36.7 C) (06/08 0445) Pulse Rate:  [61-93] 70 (06/08 0950) Cardiac Rhythm:  [-] Normal sinus rhythm (06/07 2058) Resp:  [9-77] 25 (06/08 0950) BP: (90-174)/(55-98) 158/81 mmHg (06/08 0950) SpO2:  [90 %-100 %] 100 % (06/08 0950)  Physical Exam: BP Readings from Last 1 Encounters:  06/21/12 158/81     Wt Readings from Last 1 Encounters:  06/19/12 77.111 kg (170 lb)    Weight change:   HEENT: [place]/AT, Eyes-Hazel, PERL, EOMI, Conjunctiva-Pale pink, Sclera-Non-icteric Neck: No JVD, No bruit, Trachea midline. Lungs:  Clear, Bilateral. Cardiac:  Regular rhythm, normal S1 and S2, no S3.  Abdomen:  Soft, non-tender. Extremities:  No edema present. No cyanosis. No clubbing. CNS: AxOx3, Cranial nerves grossly intact, moves all 4 extremities. Right handed. Skin: Warm and dry.   Intake/Output from previous day: 06/07 0701 - 06/08 0700 In: 2867.3 [P.O.:2240; I.V.:627.3] Out: 1125 [Urine:1125]    Lab Results: BMET    Component Value Date/Time   NA 138 06/21/2012 0529   K 4.1 06/21/2012 0529   CL 103 06/21/2012 0529   CO2 27 06/21/2012 0529   GLUCOSE 95 06/21/2012 0529   BUN 19 06/21/2012 0529   CREATININE 1.32 06/21/2012 0529   CALCIUM 9.8 06/21/2012 0529   GFRNONAA 48* 06/21/2012 0529   GFRAA 55* 06/21/2012 0529   CBC    Component Value Date/Time   WBC 8.5 06/21/2012 0529   RBC 3.71* 06/21/2012 0529   HGB 10.5* 06/21/2012 0529   HCT 33.2* 06/21/2012 0529   PLT 332 06/21/2012 0529   MCV 89.5 06/21/2012 0529   MCH 28.3 06/21/2012 0529   MCHC 31.6 06/21/2012 0529   RDW 15.6* 06/21/2012 0529   LYMPHSABS 1.5 06/18/2012 1700   MONOABS 1.4* 06/18/2012 1700   EOSABS 1.0* 06/18/2012 1700   BASOSABS 0.1 06/18/2012 1700   CARDIAC ENZYMES Lab Results  Component Value Date   CKTOTAL 20 09/17/2009   CKMB 0.9 09/17/2009   TROPONINI <0.30 06/18/2012     Scheduled Meds: . allopurinol  100 mg Oral Daily  . amiodarone  200 mg Oral Daily  . citalopram  20 mg Oral Daily  . colchicine  0.6 mg Oral Daily  . docusate sodium  100 mg Oral Daily  . folic acid  0.5 mg Oral Daily   And  . vitamin B-12  1,000 mcg Oral Daily   And  . vitamin B-6  50 mg Oral Daily  . iron polysaccharides  150 mg Oral Daily  . metoprolol tartrate  25 mg Oral BID  . morphine  30 mg Oral BID  . pantoprazole  40 mg Oral Q0600  . polyethylene glycol  17 g Oral Daily   Continuous Infusions:  PRN Meds:.HYDROcodone-acetaminophen, senna-docusate, zolpidem  Assessment/Plan: Status post paroxysmal atrial fibrillation  Status post probable upper GI bleeds secondary to NSAID  History of paroxysmal atrial fib in the past  History of Spinal stenosis  History of scoliosis  History of melanoma  Depression  Tobacco abuse  Family worried about bleeding.  May postpone Pradaxa 75 mg. twice daily use for 1 week when stools are rechecked for evidence of bleeding. F/U in 1 week.   LOS: 3 days    Orpah Cobb  MD  06/21/2012, 11:45 AM

## 2012-06-21 NOTE — Progress Notes (Signed)
Subjective: Admitted c Sxatic anemia, heme + stols and newly noted AFib (although some of "Anemia" Sxs may have in fact been Afib related).  Appreciate GI and Cards input.  S/P PRBCs. (-) EGD.  Finishing up prep for colon this am.  No new c/o No CP/SOB or palpitations.  Short run of NSVT this am in [facility] for Cards to review and comment on.  Hungry.    Objective: Vital signs in last 24 hours: Temp:  [97.4 F (36.3 C)-98.5 F (36.9 C)] 98 F (36.7 C) (06/08 0445) Pulse Rate:  [74-96] 74 (06/08 0445) Resp:  [13-25] 18 (06/08 0445) BP: (139-174)/(71-98) 173/78 mmHg (06/08 0447) SpO2:  [96 %-100 %] 97 % (06/08 0445) Weight change:  Last BM Date: 06/17/12  CBG (last 3)  No results found for this basename: GLUCAP,  in the last 72 hours  Intake/Output from previous day:  Intake/Output Summary (Last 24 hours) at 06/21/12 0751 Last data filed at 06/21/12 0438  Gross per 24 hour  Intake 2867.25 ml  Output   1125 ml  Net 1742.25 ml   06/07 0701 - 06/08 0700 In: 2867.3 [P.O.:2240; I.V.:627.3] Out: 1125 [Urine:1125]   Physical Exam Gen: elderly male in NAD  HEENT:EOMI, OP moist Cards: reg rhythm no MRG  Pulm: no rales, rhonchi, wheezes  Abd: nontender, nondistended, normal BS  Ext: 4/5 LE strength, full ROM, otherwise normal  Neuro: AAOx4, no focal deficits     Lab Results:  Recent Labs  06/20/12 0530 06/21/12 0529  NA 134* 138  K 4.1 4.1  CL 99 103  CO2 26 27  GLUCOSE 96 95  BUN 17 19  CREATININE 1.19 1.32  CALCIUM 9.6 9.8     Recent Labs  06/18/12 1700 06/19/12 0425  AST 38* 31  ALT 26 22  ALKPHOS 151* 141*  BILITOT 0.3 0.3  PROT 7.8 7.0  ALBUMIN 3.3* 2.9*     Recent Labs  06/18/12 1700  06/20/12 0530 06/21/12 0529  WBC 7.6  < > 8.2 8.5  NEUTROABS 3.6  --   --   --   HGB 8.3*  < > 9.8* 10.5*  HCT 26.3*  < > 31.2* 33.2*  MCV 90.1  < > 88.6 89.5  PLT 346  < > 283 332  < > = values in this interval not displayed.  Lab Results   Component Value Date   INR 1.27 06/18/2012   INR 1.23 07/29/2009   INR 1.24 07/28/2009     Recent Labs  06/18/12 1700  TROPONINI <0.30     Recent Labs  06/19/12 0755  TSH 1.315     Recent Labs  06/19/12 0425  VITAMINB12 736  FOLATE 18.7  FERRITIN 1810*  TIBC 256  IRON 24*  RETICCTPCT 4.2*    Micro Results: No results found for this or any previous visit (from the past 240 hour(s)).   Studies/Results: No results found.   Medications: Scheduled: . allopurinol  100 mg Oral Daily  . amiodarone  200 mg Oral Daily  . citalopram  20 mg Oral Daily  . colchicine  0.6 mg Oral Daily  . docusate sodium  100 mg Oral Daily  . folic acid  0.5 mg Oral Daily   And  . vitamin B-12  1,000 mcg Oral Daily   And  . vitamin B-6  50 mg Oral Daily  . metoprolol tartrate  25 mg Oral BID  . morphine  30 mg Oral BID  . pantoprazole  40  mg Oral Q0600  . polyethylene glycol  17 g Oral Daily   Continuous: . [START ON 06/22/2012] sodium chloride    . sodium chloride 30 mL/hr at 06/20/12 0851  . sodium chloride 20 mL/hr at 06/21/12 0411     Assessment/Plan: Principal Problem:   Anemia Active Problems:   Atrial fibrillation   GI bleed  Sxatic Iron Def Anemia. - Hgb excellent and now better at 10.5 up from 9.8. Ppost PRBC Transfusion. Continue PPI. S/p Normal EGD. Patient had a normal colonoscopy in 2006 and this will be repeated this am as he is finishing up prep. The pt is in need of chronic lifelong anticoag in setting of new onset a fib so depending on GI and scopes as to when he can start them  Parox Atrial fibrillation - Noted on EKG in ED. Now NSR on Monitor.  Appreciate Cards consult.  Now on Amio.  Await 2D ECHO read:  - Left ventricle: The cavity size was normal. Systolic function was normal. The estimated ejection fraction was in the range of 50% to 55%. Regional wall motion abnormalities cannot be excluded. Doppler parameters are consistent with abnormal left  ventricular relaxation (grade 1 diastolic dysfunction). - Mitral valve: Calcified annulus. Mildly thickened leaflets. - Atrial septum: No defect or patent foramen ovale was identified. - Pulmonary arteries: Systolic pressure was mildly increased. PA peak pressure: 42mm Hg (S).  Short run of NSVT this am in [facility] for Cards to review and comment on. Amio should help this.  He was Asxatic.  GI bleed - heme POS stools on DRE w/ symptomatic anemia. S/P appropriate Hgb response to 1U PRBC  Chronic constipation contributed to pain medications. He takes Miralax as needed with good results but is constipated at present.   CKD 3. BUN and creatinine actually better than baseline. Cr 1.19-1.32  DM2 - listed as Dx but glucose 95-120 on labs and no issues.  ?diet controlled Osteopenia - holding Fosamax while in house. May resume as outpt as EGD was fine. Joint pain - cont MS contin  Depression - cont home celexa  FEN -  On low dose IVFs Dispo -Back to [facility] tomorrow post scopes and scans and after pt eating and walkingt.  Hbg is now stable.  Coumadin/Xarelto type drug can be started as outpt at a later time.   DVT Prophylaxis - Squeezers    LOS: 3 days   Kahliya Fraleigh M 06/21/2012, 7:51 AM

## 2012-06-22 ENCOUNTER — Other Ambulatory Visit (HOSPITAL_COMMUNITY): Payer: Self-pay | Admitting: Internal Medicine

## 2012-06-22 ENCOUNTER — Other Ambulatory Visit: Payer: Self-pay | Admitting: Gastroenterology

## 2012-06-22 ENCOUNTER — Inpatient Hospital Stay (HOSPITAL_COMMUNITY): Admission: RE | Admit: 2012-06-22 | Payer: Medicare Other | Source: Ambulatory Visit

## 2012-06-22 ENCOUNTER — Encounter (HOSPITAL_COMMUNITY): Payer: Self-pay | Admitting: Gastroenterology

## 2012-06-22 DIAGNOSIS — I4891 Unspecified atrial fibrillation: Secondary | ICD-10-CM

## 2012-06-22 DIAGNOSIS — K922 Gastrointestinal hemorrhage, unspecified: Secondary | ICD-10-CM

## 2012-06-22 DIAGNOSIS — D649 Anemia, unspecified: Secondary | ICD-10-CM

## 2012-06-22 DIAGNOSIS — K5730 Diverticulosis of large intestine without perforation or abscess without bleeding: Secondary | ICD-10-CM

## 2012-06-22 LAB — CBC
Hemoglobin: 9.4 g/dL — ABNORMAL LOW (ref 13.0–17.0)
MCHC: 30.4 g/dL (ref 30.0–36.0)
Platelets: 283 10*3/uL (ref 150–400)
RBC: 3.46 MIL/uL — ABNORMAL LOW (ref 4.22–5.81)

## 2012-06-22 LAB — BASIC METABOLIC PANEL
CO2: 26 mEq/L (ref 19–32)
GFR calc non Af Amer: 47 mL/min — ABNORMAL LOW (ref >90)
Glucose, Bld: 103 mg/dL — ABNORMAL HIGH (ref 70–99)
Potassium: 4 mEq/L (ref 3.5–5.1)
Sodium: 138 mEq/L (ref 135–145)

## 2012-06-22 MED ORDER — SENNA 8.6 MG PO TABS
1.0000 | ORAL_TABLET | Freq: Every day | ORAL | Status: DC
  Administered 2012-06-22: 8.6 mg via ORAL
  Filled 2012-06-22: qty 1

## 2012-06-22 MED ORDER — DOCUSATE SODIUM 100 MG PO CAPS
100.0000 mg | ORAL_CAPSULE | Freq: Two times a day (BID) | ORAL | Status: DC
  Administered 2012-06-22 – 2012-06-23 (×3): 100 mg via ORAL
  Filled 2012-06-22 (×4): qty 1

## 2012-06-22 NOTE — Progress Notes (Signed)
Subjective:  Doing well no chest pain or shortness of breath. No further episodes of atrial fibrillation remains in sinus rhythm  Objective:  Vital Signs in the last 24 hours: Temp:  [98.4 F (36.9 C)-98.8 F (37.1 C)] 98.4 F (36.9 C) (06/09 1352) Pulse Rate:  [68-98] 98 (06/09 1452) Resp:  [20-22] 20 (06/09 1352) BP: (116-147)/(55-95) 133/55 mmHg (06/09 1452) SpO2:  [98 %-100 %] 98 % (06/09 1452) Weight:  [75.6 kg (166 lb 10.7 oz)] 75.6 kg (166 lb 10.7 oz) (06/09 0500)  Intake/Output from previous day: 06/08 0701 - 06/09 0700 In: 840 [P.O.:840] Out: 275 [Urine:275] Intake/Output from this shift: Total I/O In: 600 [P.O.:600] Out: -   Physical Exam: Neck: no adenopathy, no carotid bruit, no JVD and supple, symmetrical, trachea midline Lungs: clear to auscultation bilaterally Heart: regular rate and rhythm, S1, S2 normal and Soft systolic murmur noted no S3 gallop Abdomen: soft, non-tender; bowel sounds normal; no masses,  no organomegaly Extremities: extremities normal, atraumatic, no cyanosis or edema  Lab Results:  Recent Labs  06/21/12 0529 06/22/12 0443  WBC 8.5 8.4  HGB 10.5* 9.4*  PLT 332 283    Recent Labs  06/21/12 0529 06/22/12 0443  NA 138 138  K 4.1 4.0  CL 103 103  CO2 27 26  GLUCOSE 95 103*  BUN 19 21  CREATININE 1.32 1.33   No results found for this basename: TROPONINI, CK, MB,  in the last 72 hours Hepatic Function Panel No results found for this basename: PROT, ALBUMIN, AST, ALT, ALKPHOS, BILITOT, BILIDIR, IBILI,  in the last 72 hours No results found for this basename: CHOL,  in the last 72 hours No results found for this basename: PROTIME,  in the last 72 hours  Imaging: Imaging results have been reviewed and No results found.  Cardiac Studies:  Assessment/Plan:  Status post paroxysmal atrial fibrillation  Status post probable upper GI bleeds secondary to NSAID  History of paroxysmal atrial fib in the past  History of Spinal  stenosis  History of scoliosis  History of melanoma  Depression  Tobacco abuse Plan Continue present management Consider starting anticoagulants as  appropriate I will sign off please call if needed.  LOS: 4 days    Janari Yamada N 06/22/2012, 3:47 PM

## 2012-06-22 NOTE — Progress Notes (Signed)
Physical Therapy Treatment Patient Details Name: Ricardo Allen MRN: [id number] DOB: [birth year] Today's Date: 06/22/2012 Time: 1191-4782 PT Time Calculation (min): 20 min  PT Assessment / Plan / Recommendation Comments on Treatment Session  79 y.o. male admitted to [facility] with anemia.  He presents today with weakness this afternoon (per pt report more than what he had this morning when he walked).  DOe 2/4 with gait despite VSS on RA.  He would benefit from HHPT f/u at discharge.      Follow Up Recommendations  Home health PT;Supervision for mobility/OOB     Does the patient have the potential to tolerate intense rehabilitation    NA  Barriers to Discharge   none      Equipment Recommendations  None recommended by PT    Recommendations for Other Services   none  Frequency Min 3X/week   Plan Discharge plan remains appropriate    Precautions / Restrictions Precautions Precautions: Fall   Pertinent Vitals/Pain See vitals flow sheet.    Mobility  Bed Mobility Supine to Sit: 6: Modified independent (Device/Increase time);With rails;HOB flat Sitting - Scoot to Edge of Bed: 6: Modified independent (Device/Increase time);With rail Details for Bed Mobility Assistance: extra time needed to get to EOB from flat surface, difficult on compliant surface.  Heavy reliance on arms and railings to get to EOB.   Transfers Sit to Stand: 5: Supervision;With upper extremity assist;With armrests;From bed Stand to Sit: 5: Supervision;With upper extremity assist;With armrests;To chair/3-in-1 Details for Transfer Assistance: supervision for safety Ambulation/Gait Ambulation/Gait Assistance: 4: Min guard Ambulation Distance (Feet): 400 Feet Assistive device: Rolling walker Ambulation/Gait Assistance Details: Pt generally steady with use of RW, but assist for safety due to self reports of feeling weak and SOB.   Gait Pattern: Step-through pattern Gait velocity: 1.9 ft/sec which puts him at a neighborhood  ambulation speed (not quite an independent community ambulator >2.62 ft/sec General Gait Details: Pt feels generally weak with increased DOE with gait 2/4.  VSS when checked once we returned to the room.      Exercises General Exercises - Lower Extremity Long Arc Quad: AROM;Both;10 reps;Seated Hip ABduction/ADduction: AROM;Both;10 reps;Seated Hip Flexion/Marching: AROM;Both;10 reps;Seated Toe Raises: AROM;Both;10 reps;Seated Heel Raises: AROM;Both;10 reps;Seated   PT Goals Acute Rehab PT Goals PT Goal: Sit to Stand - Progress: Progressing toward goal PT Goal: Stand to Sit - Progress: Progressing toward goal PT Goal: Ambulate - Progress: Progressing toward goal PT Goal: Perform Home Exercise Program - Progress: Progressing toward goal  Visit Information  Last PT Received On: 06/22/12 Assistance Needed: +1    Subjective Data  Subjective: Pt reports he feels weaker this afternoon than he did this morning when he went walking then.  More SOB.   Patient Stated Goal: to return home.    Cognition  Cognition Arousal/Alertness: Awake/alert Behavior During Therapy: WFL for tasks assessed/performed Overall Cognitive Status: Within Functional Limits for tasks assessed       End of Session PT - End of Session Activity Tolerance: Patient limited by fatigue Patient left: in chair;with call bell/phone within reach     [place] B. Cyntia Staley, PT, DPT [phone]   06/22/2012, 3:03 PM

## 2012-06-22 NOTE — Progress Notes (Signed)
Subjective: No events overnight. Will work on getting out of bed and eating well today. Plan on going home w/ 24 hr assist in AM back to [facility] w/ wife    Objective: Vital signs in last 24 hours: Temp:  [97.6 F (36.4 C)-98.8 F (37.1 C)] 98.5 F (36.9 C) (06/09 0541) Pulse Rate:  [61-79] 68 (06/09 0541) Resp:  [9-77] 20 (06/09 0541) BP: (90-174)/(55-92) 142/66 mmHg (06/09 0541) SpO2:  [90 %-100 %] 100 % (06/09 0541) Weight:  [166 lb 10.7 oz (75.6 kg)] 166 lb 10.7 oz (75.6 kg) (06/09 0500) Weight change:  Last BM Date: 06/21/12  CBG (last 3)  No results found for this basename: GLUCAP,  in the last 72 hours  Intake/Output from previous day:  Intake/Output Summary (Last 24 hours) at 06/22/12 0859 Last data filed at 06/22/12 0816  Gross per 24 hour  Intake    720 ml  Output    275 ml  Net    445 ml   06/08 0701 - 06/09 0700 In: 840 [P.O.:840] Out: 275 [Urine:275]   Physical Exam Gen: elderly male in NAD  HEENT:EOMI, OP moist Cards: reg rhythm no MRG  Pulm: no rales, rhonchi, wheezes  Abd: nontender, nondistended, normal BS  Ext: 4/5 LE strength, full ROM, otherwise normal  Neuro: AAOx4, no focal deficits     Lab Results:  Recent Labs  06/21/12 0529 06/22/12 0443  NA 138 138  K 4.1 4.0  CL 103 103  CO2 27 26  GLUCOSE 95 103*  BUN 19 21  CREATININE 1.32 1.33  CALCIUM 9.8 9.4    No results found for this basename: AST, ALT, ALKPHOS, BILITOT, PROT, ALBUMIN,  in the last 72 hours   Recent Labs  06/21/12 0529 06/22/12 0443  WBC 8.5 8.4  HGB 10.5* 9.4*  HCT 33.2* 30.9*  MCV 89.5 89.3  PLT 332 283    Lab Results  Component Value Date   INR 1.27 06/18/2012   INR 1.23 07/29/2009   INR 1.24 07/28/2009    No results found for this basename: CKTOTAL, CKMB, CKMBINDEX, TROPONINI,  in the last 72 hours  No results found for this basename: TSH, T4TOTAL, FREET3, T3FREE, THYROIDAB,  in the last 72 hours  No results found for this basename:  VITAMINB12, FOLATE, FERRITIN, TIBC, IRON, RETICCTPCT,  in the last 72 hours  Micro Results: No results found for this or any previous visit (from the past 240 hour(s)).   Studies/Results: No results found.   Medications: Scheduled: . allopurinol  100 mg Oral Daily  . amiodarone  200 mg Oral Daily  . citalopram  20 mg Oral Daily  . colchicine  0.6 mg Oral Daily  . docusate sodium  100 mg Oral Daily  . folic acid  0.5 mg Oral Daily   And  . vitamin B-12  1,000 mcg Oral Daily   And  . vitamin B-6  50 mg Oral Daily  . iron polysaccharides  150 mg Oral Daily  . metoprolol tartrate  25 mg Oral BID  . morphine  30 mg Oral BID  . pantoprazole  40 mg Oral Q0600  . polyethylene glycol  17 g Oral Daily   Continuous:     Assessment/Plan: Principal Problem:   Anemia Active Problems:   Atrial fibrillation   GI bleed   Diverticulosis of colon (without mention of hemorrhage)  Sxatic Iron Def Anemia. - Remaining in the 9-10 range. Spost PRBC Transfusion on admission. Continue PPI, avoid NSAIDs.  S/p Normal EGD and colonoscopy. Will have f/u w/ GI in 1 week to repeat occult stool cards, if still positive w/ need capsule endoscopy to look for intestinal bleed in the small bowel not reached by EGD/colonoscopy    Parox Atrial fibrillation - Noted on EKG in ED. Now NSR on Monitor.  Appreciate Cards consult.  Now on Amio. He will need anticoagulation per cards recs, but family wishes to wait until GI w/u is completed, with next GI visit in 1 week. Also wish to be established w/ Dr Jacinto Halim, which will be arranged after discharge.   2D ECHO read:  - Left ventricle: The cavity size was normal. Systolic function was normal. The estimated ejection fraction was in the range of 50% to 55%. Regional wall motion abnormalities cannot be excluded. Doppler parameters are consistent with abnormal left ventricular relaxation (grade 1 diastolic dysfunction). - Mitral valve: Calcified annulus. Mildly thickened  leaflets. - Atrial septum: No defect or patent foramen ovale was identified. - Pulmonary arteries: Systolic pressure was mildly increased. PA peak pressure: 42mm Hg (S).  GI bleed - Currently stable. heme POS stools on DRE w/ symptomatic anemia. S/P appropriate Hgb response to 1U PRBC. See above for negative workup. Has GI visit as outpatient in 1 week for further evaluation   Chronic constipation contributed to pain medications. He takes Miralax as needed with good results but is constipated at present.   CKD 3. BUN and creatinine actually better than baseline. Cr 1.19-1.32  Osteopenia - holding Fosamax while in house. May resume as outpt as EGD was fine. Joint pain - cont MS contin  Depression - cont home celexa  FEN -  Full diet  Dispo -Back to [facility] Tuesday w/ 24 hour assist. Will continue [facility]iva HHPT on discharge as well. OOB today w/ pushing full diet to regain strength. F/u in my office on Wednesday.   DVT Prophylaxis - Squeezers    LOS: 4 days   Ramyah Pankowski 06/22/2012, 8:59 AM

## 2012-06-23 DIAGNOSIS — K922 Gastrointestinal hemorrhage, unspecified: Secondary | ICD-10-CM

## 2012-06-23 DIAGNOSIS — K5730 Diverticulosis of large intestine without perforation or abscess without bleeding: Secondary | ICD-10-CM

## 2012-06-23 DIAGNOSIS — D649 Anemia, unspecified: Secondary | ICD-10-CM

## 2012-06-23 DIAGNOSIS — I4891 Unspecified atrial fibrillation: Secondary | ICD-10-CM

## 2012-06-23 LAB — BASIC METABOLIC PANEL
CO2: 27 mEq/L (ref 19–32)
Calcium: 9.2 mg/dL (ref 8.4–10.5)
Chloride: 103 mEq/L (ref 96–112)
Glucose, Bld: 99 mg/dL (ref 70–99)
Sodium: 136 mEq/L (ref 135–145)

## 2012-06-23 LAB — CBC
Hemoglobin: 9.6 g/dL — ABNORMAL LOW (ref 13.0–17.0)
MCH: 28.6 pg (ref 26.0–34.0)
RBC: 3.36 MIL/uL — ABNORMAL LOW (ref 4.22–5.81)
WBC: 8.2 10*3/uL (ref 4.0–10.5)

## 2012-06-23 LAB — PROTEIN ELECTROPHORESIS, SERUM
Beta 2: 6.9 % — ABNORMAL HIGH (ref 3.2–6.5)
Beta Globulin: 7.6 % — ABNORMAL HIGH (ref 4.7–7.2)
M-Spike, %: NOT DETECTED g/dL
Total Protein ELP: 6.3 g/dL (ref 6.0–8.3)

## 2012-06-23 LAB — UIFE/LIGHT CHAINS/TP QN, 24-HR UR
Alpha 1, Urine: DETECTED — AB
Alpha 2, Urine: DETECTED — AB
Free Kappa Lt Chains,Ur: 12.5 mg/dL — ABNORMAL HIGH (ref 0.14–2.42)
Gamma Globulin, Urine: DETECTED — AB

## 2012-06-23 MED ORDER — METOPROLOL TARTRATE 25 MG PO TABS: 25.0000 mg | ORAL_TABLET | Freq: Two times a day (BID) | ORAL | Status: DC

## 2012-06-23 MED ORDER — ALLOPURINOL 100 MG PO TABS: 100.0000 mg | ORAL_TABLET | Freq: Every day | ORAL | Status: DC

## 2012-06-23 MED ORDER — AMIODARONE HCL 200 MG PO TABS: 200.0000 mg | ORAL_TABLET | Freq: Every day | ORAL | Status: DC

## 2012-06-23 MED ORDER — FERROUS SULFATE 325 (65 FE) MG PO TABS: 325.0000 mg | ORAL_TABLET | Freq: Every day | ORAL | Status: DC

## 2012-06-23 MED ORDER — DSS 100 MG PO CAPS: 100.0000 mg | ORAL_CAPSULE | Freq: Two times a day (BID) | ORAL | Status: DC

## 2012-06-23 MED ORDER — PANTOPRAZOLE SODIUM 40 MG PO TBEC: 40.0000 mg | DELAYED_RELEASE_TABLET | Freq: Every day | ORAL | Status: DC

## 2012-06-23 MED ORDER — CITALOPRAM HYDROBROMIDE 20 MG PO TABS: 20.0000 mg | ORAL_TABLET | Freq: Every day | ORAL | Status: DC

## 2012-06-23 NOTE — Discharge Summary (Addendum)
Physician Discharge Summary    DOMONIQUE BROUILLARD  MR#: [id number]  DOB:01-Dec-1925  Date of Admission: 06/18/2012 Date of Discharge: 06/23/2012  Attending Physician:Layson Bertsch  Patient's FAO:ZHYQMVH,QION EDWARD, MD  Consults:  GI and cardiology   Discharge Diagnoses: Principal Problem:   Anemia Active Problems:   Atrial fibrillation   GI bleed   Diverticulosis of colon (without mention of hemorrhage)   Discharge Medications:   Medication List    STOP taking these medications       buPROPion 100 MG 12 hr tablet  Commonly known as:  WELLBUTRIN SR     nabumetone 750 MG tablet  Commonly known as:  RELAFEN     psyllium 58.6 % packet  Commonly known as:  METAMUCIL      TAKE these medications       alendronate 70 MG tablet  Commonly known as:  FOSAMAX  Take 70 mg by mouth every Sunday. Take with a full glass of water on an empty stomach.     allopurinol 100 MG tablet  Commonly known as:  ZYLOPRIM  Take 1 tablet (100 mg total) by mouth daily.     amiodarone 200 MG tablet  Commonly known as:  PACERONE  Take 1 tablet (200 mg total) by mouth daily.     B-6 Folic Acid [phone] MCG-MCG-MG Caps  Take 1 capsule by mouth daily.     citalopram 20 MG tablet  Commonly known as:  CELEXA  Take 1 tablet (20 mg total) by mouth daily.     colchicine 0.6 MG tablet  Take 0.6 mg by mouth daily as needed (for gout).     DSS 100 MG Caps  Take 100 mg by mouth 2 (two) times daily.     ferrous sulfate 325 (65 FE) MG tablet  Commonly known as:  FERROUSUL  Take 1 tablet (325 mg total) by mouth daily with breakfast.     HYDROcodone-acetaminophen 10-325 MG per tablet  Commonly known as:  NORCO  Take 1 tablet by mouth every 6 (six) hours as needed for pain.     metoprolol tartrate 25 MG tablet  Commonly known as:  LOPRESSOR  Take 1 tablet (25 mg total) by mouth 2 (two) times daily.     morphine 30 MG 12 hr tablet  Commonly known as:  MS CONTIN  Take 30 mg by mouth 2  (two) times daily.     pantoprazole 40 MG tablet  Commonly known as:  PROTONIX  Take 1 tablet (40 mg total) by mouth daily at 6 (six) AM.     polyethylene glycol packet  Commonly known as:  MIRALAX / GLYCOLAX  Take 17 g by mouth daily as needed (constipation).        Hospital Procedures: EGD, Colonoscopy   History of Present Illness: Pt presented w/ symptomatic anemia. Found to have a fib on presentation to ED. Pt was extremely fatigued. On record review, the chronicity of the a fib is in question, because EPIC notes this diagnosis in the past, but current PCP does not have prior records to confirm this and patient has never been told that he has a fib.   Hospital Course: Fe def Anemia - Work up showed iron deficieny w/ elevated ferritin and retic count. DRE showed POS heme in stool on admission and Hgb was low at 8.2. HR elevated in 110s, but in a fib. Further work up showed elevated urine protein, and elevated light chains, SPEP is pending to eval for M  spike. I will follow up on this and order oncology consult if needed. LDH and haptoglobin were normal. Pt received 1U PRBC and responded appropriately. Hgb then remained in the 9-10 range for remainder of stay. Pt underwent EGD and colonoscopy at rec's of GI. Both showed no evidence of source of bleed. Plan is to have pt follow up in 1 week to have stool cards rechecked, and if positive at that time, he will need capsule endoscopy. Currently the most likely cause is presumed to be 2/2 ulcer from NSAID use. Of note, the patient did have normal BM prior to discharge and it was checked and negative for occult blood. He will be discharged on ferrous sulfate.   A fib - question of chronicity of this problem, but documented on EKG on this admission. Dr Sharyn Lull was consulted. Pt was started on amiodarone 200mg  daily and converted to sinus rhythm. Also started on metoprolol 25mg  BID. He remained rate controlled in the 60-70s and in sinus rhythm x 48+  hours prior to discharge. The main question at this point is whether to anticoagulate. He is at increased risk, mainly due to age. Given admitted for GI bleed/anemia and source not being clear at this point, patient and wife are wishing to hold on anticoagulation at this point until GI w/u is complete in the next week on outpatient f/u. They are in understanding of the risks a/w not anticoagulating (including CVA) but they feel at this time the risk of bleed outways the benefit. We will continue to address at outpatient but no anticoagulation at this time. Family also wishes to change cardiology care to Dr Jacinto Halim, so we will arrange this new patient visit at his PCP f/u tomorrow.   GI bleed - presumed 2/2 ulcer from NSAIDs. Started on PPI. Hgb remained stable since admission s/p 1U tranfusion. POS occult stool card on DRE on admission, but negative card on normal BM prior to discharge. EGD/colonoscopy unremarkable. Pt has f/u in 1 week w/ GI to further discuss the etiology and future plan.  Constipation - had BM last night. On miralax and colace. Will discharge on this regimen. No abd pain or straining at this time    Day of Discharge Exam BP 130/59  Pulse 66  Temp(Src) 98.6 F (37 C) (Oral)  Resp 18  Ht 6' (1.829 m)  Wt 166 lb 10.7 oz (75.6 kg)  BMI 22.6 kg/m2  SpO2 97%  Physical Exam: Gen: elderly male in NAD  HEENT:EOMI, OP moist  Cards: reg rhythm no MRG  Pulm: no rales, rhonchi, wheezes  Abd: nontender, nondistended, normal BS  Ext: 4/5 LE strength, full ROM, otherwise normal  Neuro: AAOx4, no focal deficits   Discharge Labs:  Recent Labs  06/22/12 0443 06/23/12 0445  NA 138 136  K 4.0 4.0  CL 103 103  CO2 26 27  GLUCOSE 103* 99  BUN 21 23  CREATININE 1.33 1.37*  CALCIUM 9.4 9.2   No results found for this basename: AST, ALT, ALKPHOS, BILITOT, PROT, ALBUMIN,  in the last 72 hours  Recent Labs  06/22/12 0443 06/23/12 0445  WBC 8.4 8.2  HGB 9.4* 9.6*  HCT 30.9*  30.0*  MCV 89.3 89.3  PLT 283 274   Lab Results  Component Value Date   INR 1.27 06/18/2012   INR 1.23 07/29/2009   INR 1.24 07/28/2009   No results found for this basename: CKTOTAL, CKMB, CKMBINDEX, TROPONINI,  in the last 72 hours No results found for  this basename: TSH, T4TOTAL, FREET3, T3FREE, THYROIDAB,  in the last 72 hours No results found for this basename: VITAMINB12, FOLATE, FERRITIN, TIBC, IRON, RETICCTPCT,  in the last 72 hours  Discharge instructions:     Discharge Orders   Future Appointments Provider Department Dept Phone   07/10/2012 2:45 PM Louis Meckel, MD [facility] [phone]   Future Orders Complete By Expires     Call MD for:  difficulty breathing, headache or visual disturbances  As directed     Call MD for:  extreme fatigue  As directed     Call MD for:  persistant dizziness or light-headedness  As directed     Diet general  As directed     Increase activity slowly  As directed        Follow-up Information   Follow up with Carrie Mew, MD. Schedule an appointment as soon as possible for a visit in 1 week.   Contact information:   [address] [place] [address] [phone]       Follow up with Pamella Pert, MD. Schedule an appointment as soon as possible for a visit in 1 week.   Contact information:   [address] [place] [address] [phone]       Disposition: back to [place] w/ [facility].  Follow-up Appts: Follow-up with Dr. Link Snuffer at [facility] tomorrow. You have appt information. At this visit we will ensure you have your visit to establish care w/ Dr Jacinto Halim for cardiology follow up vs continue seeing Dr Sharyn Lull per patient preference.   Please call above number to schedule a visit in 1 week w/ Dr Lovell Sheehan  Condition on Discharge:  Stable   Tests Needing Follow-up: heme stool cards to be repeated   Time spent in discharge (includes  decision making & examination of pt): 45 minutes    Signed: Haskell Rihn 06/23/2012, 7:26 AM

## 2012-06-24 ENCOUNTER — Telehealth: Payer: Self-pay | Admitting: Oncology

## 2012-06-24 NOTE — Telephone Encounter (Signed)
S/W PT WIFE IN RE TO NP APPT 06/12 @ 3 W./DR. HA REFERRING DR. Lorin Picket [facility]OL[place]DA DX- MULTIPLE MYELOMA WELCOME PACKET AT REGISTRATION

## 2012-06-25 ENCOUNTER — Ambulatory Visit (HOSPITAL_BASED_OUTPATIENT_CLINIC_OR_DEPARTMENT_OTHER): Payer: Medicare Other | Admitting: Oncology

## 2012-06-25 ENCOUNTER — Other Ambulatory Visit (HOSPITAL_BASED_OUTPATIENT_CLINIC_OR_DEPARTMENT_OTHER): Payer: Medicare Other | Admitting: Lab

## 2012-06-25 ENCOUNTER — Encounter: Payer: Self-pay | Admitting: Oncology

## 2012-06-25 ENCOUNTER — Telehealth: Payer: Self-pay | Admitting: Oncology

## 2012-06-25 ENCOUNTER — Ambulatory Visit: Payer: Medicare Other

## 2012-06-25 VITALS — BP 106/46 | HR 64 | Temp 99.1°F | Resp 19 | Ht 72.0 in | Wt 165.0 lb

## 2012-06-25 DIAGNOSIS — D649 Anemia, unspecified: Secondary | ICD-10-CM

## 2012-06-25 LAB — MORPHOLOGY: PLT EST: ADEQUATE

## 2012-06-25 LAB — LACTATE DEHYDROGENASE (CC13): LDH: 161 U/L (ref 125–245)

## 2012-06-25 LAB — CBC & DIFF AND RETIC
Basophils Absolute: 0.1 10*3/uL (ref 0.0–0.1)
EOS%: 12.5 % — ABNORMAL HIGH (ref 0.0–7.0)
Eosinophils Absolute: 1 10*3/uL — ABNORMAL HIGH (ref 0.0–0.5)
HCT: 30.2 % — ABNORMAL LOW (ref 38.4–49.9)
HGB: 9.6 g/dL — ABNORMAL LOW (ref 13.0–17.1)
Immature Retic Fract: 4.5 % (ref 3.00–10.60)
MCH: 28.7 pg (ref 27.2–33.4)
MONO#: 0.8 10*3/uL (ref 0.1–0.9)
NEUT#: 4.5 10*3/uL (ref 1.5–6.5)
NEUT%: 55.3 % (ref 39.0–75.0)
RDW: 15.4 % — ABNORMAL HIGH (ref 11.0–14.6)
Retic %: 1.24 % (ref 0.80–1.80)
Retic Ct Abs: 41.54 10*3/uL (ref 34.80–93.90)
WBC: 8.2 10*3/uL (ref 4.0–10.3)
lymph#: 1.8 10*3/uL (ref 0.9–3.3)

## 2012-06-25 NOTE — Progress Notes (Signed)
[facility]  Telephone:(336) [phone] [phone]     INITIAL HEMATOLOGY CONSULTATION    Referral MD:  Alysia Penna, M.D.   Reason for Referral: chronic normocytic anemia, rule out myeloma.     HPI: Mr. Ricardo Allen is an 79 year-old man with afib, gout, hyperlipidemia, chronic lower back pain.  He had normal CBC in 2009.  He started having anemia in 2011.  This persisted.  His Hgb on 06/19/2012 was 8.1.  Recently his PCP sent for extensive anemia work up. There was elevated kappa light chain on UPEP.  His recent EGD/colonoscopy were non revealing except for diverticulosis.  He was kindly referred to the [facility] for evaluation.  Ricardo Allen presented to the clinic for the first time today with his wife and a daughter.  He reported that he has been feeling more fatigue the last few months. He has chronic right hip pain which has been attributed to DJD, OA.  He had right hip replacement in the past but the pain has been worsening the last few years.  He requires chronic pain meds.  He is not able to ambulate long distance by himself.  He spends most of his awake time at rest.  He does not exert himself much to see if he has DOE.  He denied visible source of bleeding.   Patient denies fever, anorexia, weight loss, headache, visual changes, confusion, drenching night sweats, palpable lymph node swelling, mucositis, odynophagia, dysphagia, nausea vomiting, jaundice, chest pain, palpitation, productive cough, abdominal pain, abdominal swelling, early satiety, hematuria, skin rash, spontaneous bleeding, joint swelling, joint pain, heat or cold intolerance, bowel bladder incontinence, back pain, focal motor weakness, paresthesia, depression.      Past Medical History  Diagnosis Date  . ANEMIA NOS 08/19/2006  . Atrial fibrillation 11/27/2009  . DEPRESSION 08/27/2006  . DISORDER, LUMBAR DISC W/MYELOPATHY 08/19/2006  . GOUT 03/22/2009  . HYPERLIPIDEMIA 08/27/2006  .  HYPOTENSION, ORTHOSTATIC 10/31/2009  . Impaired fasting glucose 01/27/2007  . LOW BACK PAIN 02/03/2007  . LUMBAR RADICULOPATHY, LEFT 10/09/2006  . LUNG NODULE 03/29/2008  . MELANOMA, MALIGNANT, TRUNK 08/19/2006    early stage; just exicion.   . OBSTRUCTIVE SLEEP APNEA 08/19/2006  . TRAUMATIC ARTHROPATHY PELVIC REGION AND THIGH 06/29/2009  . TOBACCO ABUSE 12/31/2007  . Wet senile macular degeneration   . Diverticulosis   :    Past Surgical History  Procedure Laterality Date  . Cataract extraction    . Lumbar laminectomy    . Lumbar fusion    . Tonsilectomy, adenoidectomy, bilateral myringotomy and tubes    . Melanoma excision      left lower back  . Esophagogastroduodenoscopy N/A 06/20/2012    Procedure: ESOPHAGOGASTRODUODENOSCOPY (EGD);  Surgeon: Louis Meckel, MD;  Location: [facility];  Service: Endoscopy;  Laterality: N/A;  . Colonoscopy N/A 06/21/2012    Procedure: COLONOSCOPY;  Surgeon: Louis Meckel, MD;  Location: [facility];  Service: Endoscopy;  Laterality: N/A;  :   CURRENT MEDS: Current Outpatient Prescriptions  Medication Sig Dispense Refill  . alendronate (FOSAMAX) 70 MG tablet Take 70 mg by mouth every Sunday. Take with a full glass of water on an empty stomach.      Marland Kitchen allopurinol (ZYLOPRIM) 100 MG tablet Take 1 tablet (100 mg total) by mouth daily.  30 tablet  5  . amiodarone (PACERONE) 200 MG tablet Take 1 tablet (200 mg total) by mouth daily.  30 tablet  5  . citalopram (CELEXA)  20 MG tablet Take 1 tablet (20 mg total) by mouth daily.  30 tablet  5  . Cobalamine Combinations (B-6 FOLIC ACID) [phone] MCG-MCG-MG CAPS Take 1 capsule by mouth daily.  30 each  11  . docusate sodium 100 MG CAPS Take 100 mg by mouth 2 (two) times daily.  60 capsule  5  . ferrous sulfate (FERROUSUL) 325 (65 FE) MG tablet Take 1 tablet (325 mg total) by mouth daily with breakfast.  30 tablet  5  . HYDROcodone-acetaminophen (NORCO) 10-325 MG per tablet Take 1 tablet by mouth every 6  (six) hours as needed for pain.      . metoprolol tartrate (LOPRESSOR) 25 MG tablet Take 1 tablet (25 mg total) by mouth 2 (two) times daily.  60 tablet  5  . morphine (MS CONTIN) 30 MG 12 hr tablet Take 30 mg by mouth 2 (two) times daily.        . pantoprazole (PROTONIX) 40 MG tablet Take 1 tablet (40 mg total) by mouth daily at 6 (six) AM.  30 tablet  5  . polyethylene glycol (MIRALAX / GLYCOLAX) packet Take 17 g by mouth daily as needed (constipation).      . Probiotic Product (PROBIOTIC DAILY PO) Take by mouth.      . colchicine 0.6 MG tablet Take 0.6 mg by mouth daily as needed (for gout).       No current facility-administered medications for this visit.      Allergies  Allergen Reactions  . Fentanyl     REACTION: nausea and depression  . Hydromorphone Hcl     REACTION: depressed and nausea  . Sulfonamide Derivatives     REACTION: hives  :  Family History  Problem Relation Age of Onset  . Pancreatic cancer Mother   . Cancer Mother     pancreas  . Testicular cancer Father   . Cancer Father     testicular  . Arthritis Brother   . Cancer Maternal Aunt     head/neck cancer  :  History   Social History  . Marital Status: Married    Spouse Name: N/A    Number of Children: 0  . Years of Education: N/A   Occupational History  .      interior designer/architect   Social History Main Topics  . Smoking status: Former Smoker -- 2.00 packs/day    Types: Cigarettes    Quit date: 04/14/2008  . Smokeless tobacco: Not on file  . Alcohol Use: 1.8 oz/week    3 Glasses of wine per week  . Drug Use: No  . Sexually Active: Not Currently   Other Topics Concern  . Not on file   Social History Narrative  . No narrative on file  :  REVIEW OF SYSTEM:  The rest of the 14-point review of sytem was negative.   Exam: ECOG 2 due to chronic right hip pain.   General:  well-nourished man, in no acute distress.  Eyes:  no scleral icterus.  ENT:  There were no oropharyngeal  lesions.  Neck was without thyromegaly.  Lymphatics:  Negative cervical, supraclavicular or axillary adenopathy.  Respiratory: lungs were clear bilaterally without wheezing or crackles.  Cardiovascular:  Regular rate and rhythm, S1/S2, without murmur, rub or gallop.  There was no pedal edema.  GI:  abdomen was soft, flat, nontender, nondistended, without organomegaly.  Muscoloskeletal:  no spinal tenderness of palpation of vertebral spine.  There was scoliosis.  Skin exam was  without echymosis, petichae.  Neuro exam was nonfocal.  Patient was able to get on and off exam table with minor assistance.  Gait was normal.  Patient was alert and oriented.  Attention was good.   Language was appropriate.  Mood was normal without depression.  Speech was not pressured.  Thought content was not tangential.    LABS:  Lab Results  Component Value Date   WBC 8.2 06/25/2012   HGB 9.6* 06/25/2012   HCT 30.2* 06/25/2012   PLT 242 06/25/2012   GLUCOSE 99 06/23/2012   CHOL 212* 05/24/2011   TRIG 155.0* 05/24/2011   HDL 53.90 05/24/2011   LDLDIRECT 137.5 05/24/2011   ALT 22 06/19/2012   AST 31 06/19/2012   NA 136 06/23/2012   K 4.0 06/23/2012   CL 103 06/23/2012   CREATININE 1.37* 06/23/2012   BUN 23 06/23/2012   CO2 27 06/23/2012   PSA 0.29 Test Methodology: [employer] PSA 09/17/2009   INR 1.27 06/18/2012   HGBA1C 5.5 05/24/2011   MICROALBUR 1.2 08/27/2006    No results found.  Blood smear review:   I personally reviewed the patient's peripheral blood smear today.  There was isocytosis.  There was no peripheral blast.  There was no schistocytosis, spherocytosis, target cell, rouleaux formation, tear drop cell.  There was no giant platelets or platelet clumps.      ASSESSMENT AND PLAN:    1.  Issues:  Anemia.  2.  Work up ruled out GI bleeding, hemolysis, iron/Vit B12 deficiency.  Primary doctor was concerned about myeloma; however, concern for this is low at this time due to negative work up so far.  SPEP was negative.   UPEP showed slightly elevated kappa light chain but this was polyclonal and there was no Bence Jones protein.  3.  Possibility of causes of anemia:  Anemia of chronic kidney diseases.  Cannot rule out myelodysplastic syndrome. 4.  Recommendation:  Diagnostic bone marrow biopsy to rule out MDS.  Mr. Shenberger and his relatives expressed informed understanding and wished to proceed with bone marrow biopsy next week.  5.  Treatment:  Pending result of bone marrow biopsy.  If bone marrow biopsy is not contributory, we may consider starting Aranesp with goal to improve hemoglobin and improve stamina.  Potential side effects of Aranesp:  Slight increase in risk of stroke and heart attack.  6.  Follow up:  In about 1 month to discuss result of bone marrow biopsy and appropriate treatment.       The length of time of the face-to-face encounter was 45 minutes. More than 50% of time was spent counseling and coordination of care.     Thank you for this referral.      '

## 2012-06-25 NOTE — Patient Instructions (Addendum)
1.  Issues:  Anemia.  2.  Work up ruled out GI bleeding, hemolysis, iron/Vit B12 deficiency.  Primary doctor was concerned about myeloma; however, concern for this is low at this time due to negative work up so far.  3.  Possibility of causes of anemia:  Anemia of chronic diseases.  Cannot rule out myelodysplastic syndrome. 4.  Recommendation:  Diagnostic bone marrow biopsy. 5.  Treatment:  Pending result of bone marrow biopsy.  If bone marrow biopsy is not contributory, we may consider starting Aranesp with goal to improve hemoglobin and improve stamina.  Potential side effects of Aranesp:  Slight increase in risk of stroke and heart attack.

## 2012-06-25 NOTE — Telephone Encounter (Signed)
C/D 06/25/12 for appt. 06/25/12 °

## 2012-06-25 NOTE — Telephone Encounter (Signed)
Gave pt appt for lab and MD on july 2014

## 2012-06-26 ENCOUNTER — Telehealth: Payer: Self-pay | Admitting: *Deleted

## 2012-06-26 LAB — KAPPA/LAMBDA LIGHT CHAINS: Kappa:Lambda Ratio: 1.25 (ref 0.26–1.65)

## 2012-06-26 NOTE — Telephone Encounter (Signed)
Instructed wife of date/time for BMBx and reminded to hold Xarelto x 2 days prior to procedures.  She verbalized understanding.  S/w Flow Cytometry and confirmed they can assist on Tues 6/17 at 9:45 am as scheduled.

## 2012-06-30 ENCOUNTER — Other Ambulatory Visit: Payer: Medicare Other | Admitting: Lab

## 2012-06-30 ENCOUNTER — Ambulatory Visit (HOSPITAL_BASED_OUTPATIENT_CLINIC_OR_DEPARTMENT_OTHER): Payer: Medicare Other | Admitting: Oncology

## 2012-06-30 ENCOUNTER — Other Ambulatory Visit (HOSPITAL_COMMUNITY)
Admission: RE | Admit: 2012-06-30 | Discharge: 2012-06-30 | Disposition: A | Discharge status: Home / Self Care | Payer: Medicare Other | Source: Ambulatory Visit | Attending: Oncology | Admitting: Oncology

## 2012-06-30 VITALS — BP 158/76 | HR 74 | Temp 98.1°F

## 2012-06-30 DIAGNOSIS — D649 Anemia, unspecified: Secondary | ICD-10-CM

## 2012-06-30 LAB — CBC
MCV: 91.1 fL (ref 78.0–100.0)
Platelets: 269 10*3/uL (ref 150–400)
RDW: 15.8 % — ABNORMAL HIGH (ref 11.5–15.5)
WBC: 9.1 10*3/uL (ref 4.0–10.5)

## 2012-06-30 NOTE — Progress Notes (Signed)
Pt observed post bone marrow biopsy for 30 minutes. Dressing dry and intact without bleeding. Pt denies complaints or needs at this time. Pt and wife given post bone marrow biopsy after care sheet and questions answered. V/S WNL. Pt discharged alert and ambulatory via walker with wife at side.

## 2012-06-30 NOTE — Patient Instructions (Addendum)
[facility] Discharge Instructions for Post Bone Marrow Procedure  Today you had a bone marrow biopsy and aspirate of left hip  Please keep the pressure dressing in place for at least 24 hours.  Have someone check your dressing periodically for bleeding.  If needed you can reapply a pressure dressing to the site.    IF BLEEDING REOCCURS THAT SHOULD BE REPORTED IMMEDIATELY. Call the Cancer Center at [phone] if during business hours. Or report to the Emergency Room.   I have been informed and understand all the instructions given to me. I know to contact the clinic, my physician, or go to the Emergency Department if any problems should occur. I do not have any questions at this time, but understand that I may call the clinic during office hours at [phone]  should I have any questions or need assistance in obtaining follow up care.    __________________________________________  _____________  __________ Signature of Patient or Authorized Representative            Date                   Time    __________________________________________ Nurse's Signature

## 2012-06-30 NOTE — Procedures (Signed)
   [facility]  Telephone:(336) [phone] Fax:(336) [phone]   BONE MARROW BIOPSY AND ASPIRATION   INDICATION:  Progressive anemia.   Procedure: After obtained from consent, Ricardo Allen was placed in the prone position. He has not been taking Pradaxa for 2 days prior to the bone marrow biopsy. Time out was performed verifying correct patient and procedure. The skin overlying the left posterior crest was prepped with Betadine and draped in the usual sterile fashion. The skin and periosteum were infiltrated with 10 mL of 2% lidocaine x3 as he was still uncomfortable after the first two 10mL.  A small puncture wound was made with #11 scalpel blade.  Bone marrow aspirate was obtained on the first pass of the aspiration needle.  One ore biopsy was obtained through the same incision.   The aspirate was sent for routine histology, flow cytometry, and cytogenetics.  Core biopsy was sent for routine histology.   Gretta Cool tolerated procedure well with minimal  blood loss and without immediate complication.   A sterile dressing was applied.   Jethro Bolus M.D. 06/30/2012

## 2012-07-02 ENCOUNTER — Encounter: Payer: Self-pay | Admitting: Oncology

## 2012-07-03 ENCOUNTER — Telehealth: Payer: Self-pay | Admitting: Dietician

## 2012-07-10 ENCOUNTER — Ambulatory Visit: Payer: Medicare Other | Admitting: Gastroenterology

## 2012-07-10 LAB — CHROMOSOME ANALYSIS, BONE MARROW

## 2012-07-22 ENCOUNTER — Other Ambulatory Visit (HOSPITAL_BASED_OUTPATIENT_CLINIC_OR_DEPARTMENT_OTHER): Payer: Medicare Other | Admitting: Lab

## 2012-07-22 ENCOUNTER — Ambulatory Visit (HOSPITAL_BASED_OUTPATIENT_CLINIC_OR_DEPARTMENT_OTHER): Payer: Medicare Other | Admitting: Oncology

## 2012-07-22 ENCOUNTER — Encounter: Payer: Self-pay | Admitting: Oncology

## 2012-07-22 ENCOUNTER — Telehealth: Payer: Self-pay | Admitting: Oncology

## 2012-07-22 VITALS — BP 141/68 | HR 67 | Temp 98.1°F | Resp 18 | Ht 73.0 in | Wt 170.6 lb

## 2012-07-22 DIAGNOSIS — D649 Anemia, unspecified: Secondary | ICD-10-CM

## 2012-07-22 LAB — CBC WITH DIFFERENTIAL/PLATELET
EOS%: 5.9 % (ref 0.0–7.0)
Eosinophils Absolute: 0.5 10*3/uL (ref 0.0–0.5)
MCH: 29.8 pg (ref 27.2–33.4)
MCV: 89.8 fL (ref 79.3–98.0)
MONO%: 9.6 % (ref 0.0–14.0)
NEUT#: 5.2 10*3/uL (ref 1.5–6.5)
RBC: 3.46 10*6/uL — ABNORMAL LOW (ref 4.20–5.82)
RDW: 16.7 % — ABNORMAL HIGH (ref 11.0–14.6)
lymph#: 1.8 10*3/uL (ref 0.9–3.3)

## 2012-07-22 NOTE — Telephone Encounter (Signed)
gv and printed appt sched and avs for pt  °

## 2012-07-23 NOTE — Progress Notes (Signed)
[facility]  Telephone:([phone]) [phone] Fax:([phone]) [phone]   OFFICE PROGRESS NOTE   Cc:  Alysia Penna, MD  DIAGNOSIS:  Anemia of chronic kidney disease.   CURRENT THERAPY:  Watchful observation.   INTERVAL HISTORY: Ricardo Allen 79 y.o. male returns for regular follow up with his wife and a family friend.  He reports feeling stable.  He has mild to moderate weakness that has been on going for years.  He requires help with activities of daily living.    Patient denies fever, anorexia, weight loss, headache, visual changes, confusion, drenching night sweats, palpable lymph node swelling, mucositis, odynophagia, dysphagia, nausea vomiting, jaundice, chest pain, palpitation, shortness of breath, dyspnea on exertion, productive cough, gum bleeding, epistaxis, hematemesis, hemoptysis, abdominal pain, abdominal swelling, early satiety, melena, hematochezia, hematuria, skin rash, spontaneous bleeding, joint swelling, joint pain, heat or cold intolerance, bowel bladder incontinence, back pain, focal motor weakness, paresthesia, depression.    Past Medical History  Diagnosis Date  . ANEMIA NOS 08/19/2006  . Atrial fibrillation 11/27/2009  . DEPRESSION 08/27/2006  . DISORDER, LUMBAR DISC W/MYELOPATHY 08/19/2006  . GOUT 03/22/2009  . HYPERLIPIDEMIA 08/27/2006  . HYPOTENSION, ORTHOSTATIC 10/31/2009  . Impaired fasting glucose 01/27/2007  . LOW BACK PAIN 02/03/2007  . LUMBAR RADICULOPATHY, LEFT 10/09/2006  . LUNG NODULE 03/29/2008  . MELANOMA, MALIGNANT, TRUNK 08/19/2006    early stage; just exicion.   . OBSTRUCTIVE SLEEP APNEA 08/19/2006  . TRAUMATIC ARTHROPATHY PELVIC REGION AND THIGH 06/29/2009  . TOBACCO ABUSE 12/31/2007  . Wet senile macular degeneration   . Diverticulosis   . Anemia of other chronic disease     Past Surgical History  Procedure Laterality Date  . Cataract extraction    . Lumbar laminectomy    . Lumbar fusion    . Tonsilectomy, adenoidectomy, bilateral  myringotomy and tubes    . Melanoma excision      left lower back  . Esophagogastroduodenoscopy N/A 06/20/2012    Procedure: ESOPHAGOGASTRODUODENOSCOPY (EGD);  Surgeon: Louis Meckel, MD;  Location: [facility];  Service: Endoscopy;  Laterality: N/A;  . Colonoscopy N/A 06/21/2012    Procedure: COLONOSCOPY;  Surgeon: Louis Meckel, MD;  Location: [facility];  Service: Endoscopy;  Laterality: N/A;    Current Outpatient Prescriptions  Medication Sig Dispense Refill  . alendronate (FOSAMAX) 70 MG tablet Take 70 mg by mouth every Sunday. Take with a full glass of water on an empty stomach.      Marland Kitchen allopurinol (ZYLOPRIM) 100 MG tablet Take 1 tablet (100 mg total) by mouth daily.  30 tablet  5  . amiodarone (PACERONE) 200 MG tablet Take 1 tablet (200 mg total) by mouth daily.  30 tablet  5  . citalopram (CELEXA) 20 MG tablet Take 1 tablet (20 mg total) by mouth daily.  30 tablet  5  . Cobalamine Combinations (B-6 FOLIC ACID) [phone] MCG-MCG-MG CAPS Take 1 capsule by mouth daily.  30 each  11  . colchicine 0.6 MG tablet Take 0.6 mg by mouth daily as needed (for gout).      Marland Kitchen docusate sodium 100 MG CAPS Take 100 mg by mouth 2 (two) times daily.  60 capsule  5  . ferrous sulfate (FERROUSUL) 325 (65 FE) MG tablet Take 1 tablet (325 mg total) by mouth daily with breakfast.  30 tablet  5  . HYDROcodone-acetaminophen (NORCO) 10-325 MG per tablet Take 1 tablet by mouth every 6 (six) hours as needed for pain.      Marland Kitchen  metoprolol tartrate (LOPRESSOR) 25 MG tablet Take 1 tablet (25 mg total) by mouth 2 (two) times daily.  60 tablet  5  . morphine (MS CONTIN) 30 MG 12 hr tablet Take 30 mg by mouth 2 (two) times daily.        . pantoprazole (PROTONIX) 40 MG tablet Take 1 tablet (40 mg total) by mouth daily at 6 (six) AM.  30 tablet  5  . polyethylene glycol (MIRALAX / GLYCOLAX) packet Take 17 g by mouth daily as needed (constipation).      . Probiotic Product (PROBIOTIC DAILY PO) Take by mouth.       No  current facility-administered medications for this visit.    ALLERGIES:  is allergic to fentanyl; hydromorphone hcl; and sulfonamide derivatives.  REVIEW OF SYSTEMS:  The rest of the 14-point review of system was negative.   Filed Vitals:   07/22/12 1139  BP: 141/68  Pulse: 67  Temp: 98.1 F (36.7 C)  Resp: 18   Wt Readings from Last 3 Encounters:  07/22/12 170 lb 9.6 oz (77.384 kg)  06/25/12 165 lb (74.844 kg)  06/22/12 166 lb 10.7 oz (75.6 kg)   ECOG Performance status: 2  PHYSICAL EXAMINATION:   General:  Thin-appearing man, in no acute distress.  Eyes:  no scleral icterus.  ENT:  There were no oropharyngeal lesions.  Neck was without thyromegaly.  Lymphatics:  Negative cervical, supraclavicular or axillary adenopathy.  Respiratory: lungs were clear bilaterally without wheezing or crackles.  Cardiovascular:  Regular rate and rhythm, S1/S2, without murmur, rub or gallop.  There was no pedal edema.  GI:  abdomen was soft, flat, nontender, nondistended, without organomegaly.  Muscoloskeletal:  no spinal tenderness of palpation of vertebral spine.  Skin exam was without echymosis, petichae.  Neuro exam was nonfocal.  Patient was wheelchair bound.    LABORATORY/RADIOLOGY DATA:  Lab Results  Component Value Date   WBC 8.4 07/22/2012   HGB 10.3* 07/22/2012   HCT 31.0* 07/22/2012   PLT 208 07/22/2012   GLUCOSE 99 06/23/2012   CHOL 212* 05/24/2011   TRIG 155.0* 05/24/2011   HDL 53.90 05/24/2011   LDLDIRECT 137.5 05/24/2011   ALKPHOS 141* 06/19/2012   ALT 22 06/19/2012   AST 31 06/19/2012   NA 136 06/23/2012   K 4.0 06/23/2012   CL 103 06/23/2012   CREATININE 1.37* 06/23/2012   BUN 23 06/23/2012   CO2 27 06/23/2012   PSA 0.29 Test Methodology: [employer] PSA 09/17/2009   INR 1.27 06/18/2012   HGBA1C 5.5 05/24/2011   MICROALBUR 1.2 08/27/2006      ASSESSMENT AND PLAN:   1.  Anemia:   - Extensive work up including bone marrow biopsy was negative for MDS, aplasia.  Lab work was negative for  myeloma, Vit B12 deficiency, hemolysis.  He has slight renal insufficiency.  Thus, his anemia is most likely due to anemia of chronic kidney disease.   - Treatment:  His Hgb is stable.  There is no active bleeding. He has no anemia symptoms.  There is no indication for blood transfusion.   In the future, if the requires frequent blood transfusion, we may consider Aranesp.  Mr. Bilyk and his wife agreed with watchful observation for now.   2.  Follow up: repeat CBC with is PCP in about 3 months.  Return visit in about 8 months.    I informed Mr. Thornhill that I am leaving the practice.  [facility] will [facility] for him to  see another provider when he returns.      The length of time of the face-to-face encounter was 15 minutes. More than 50% of time was spent counseling and coordination of care.

## 2012-08-03 ENCOUNTER — Encounter: Payer: Self-pay | Admitting: Pulmonary Disease

## 2012-08-03 ENCOUNTER — Ambulatory Visit (INDEPENDENT_AMBULATORY_CARE_PROVIDER_SITE_OTHER): Payer: Self-pay | Admitting: Pulmonary Disease

## 2012-08-03 VITALS — BP 100/56 | HR 65 | Temp 98.0°F | Ht 72.0 in | Wt 175.8 lb

## 2012-08-03 DIAGNOSIS — G4730 Sleep apnea, unspecified: Secondary | ICD-10-CM

## 2012-08-03 DIAGNOSIS — F514 Sleep terrors [night terrors]: Secondary | ICD-10-CM

## 2012-08-03 DIAGNOSIS — G2581 Restless legs syndrome: Secondary | ICD-10-CM | POA: Insufficient documentation

## 2012-08-03 DIAGNOSIS — G478 Other sleep disorders: Secondary | ICD-10-CM

## 2012-08-03 NOTE — Progress Notes (Signed)
Chief Complaint  Patient presents with  . Sleep Consult    Dr. Jacinto Halim referred because he wants to know if the pt has central nervous system sleep apnea.  Pt currently uses CPAP every night for 9 hours. Pt wife states he still has alot of restless leg symptoms with cpap. Denies any snoring.     History of Present Illness: Ricardo Allen is a 79 y.o. male for evaluation of sleep problems.  He was diagnosed with sleep apnea after having sleep study about 10 to 15 years ago.  He has been using CPAP ever since.  He has lost about 20 lbs over the past one year due to illness >> he has regained about 10 lbs.  He is followed by Dr. Jacinto Halim for atrial fibrillation.  He still has intermittent snoring, and periods of apnea.  As a result he was referred to pulmonary/sleep medicine for further assessment.  He goes to sleep at 11 pm.  He falls asleep quickly.  He wakes up once times to use the bathroom.  He gets out of bed at 830 am.  He feels okay in the morning.  He denies morning headache.  He does not use anything to help him fall sleep or stay awake.  He will get tired as the day goes on.  He will nap for about 1 to 3 hours during the day, and does not use CPAP >> he feels his sleep is okay w/o CPAP.  He denies sleep walking, bruxism, or nightmares.  He denies sleep hallucinations, sleep paralysis, or cataplexy.  The Epworth score is 12 out of 24.  His wife reports that he will still have periods of apnea when not using CPAP, except he does not make noise when he has these happen.  He used to talk in his sleep >> this resolved after he started using CPAP.  He will get episodes of kicking and thrashing >> these happen earlier in the sleep cycle, and are not associated with dreams; he does not recall doing these activities after waking up.  He has problems with his ankles, hips, and back.  As a result he is taking chronic pain medications.  He has history of restless leg syndrome.  Tests:   Ricardo Allen  has a past medical history of ANEMIA NOS (08/19/2006); Atrial fibrillation (11/27/2009); DEPRESSION (08/27/2006); DISORDER, LUMBAR DISC W/MYELOPATHY (08/19/2006); GOUT (03/22/2009); HYPERLIPIDEMIA (08/27/2006); HYPOTENSION, ORTHOSTATIC (10/31/2009); Impaired fasting glucose (01/27/2007); LOW BACK PAIN (02/03/2007); LUMBAR RADICULOPATHY, LEFT (10/09/2006); LUNG NODULE (03/29/2008); MELANOMA, MALIGNANT, TRUNK (08/19/2006); OBSTRUCTIVE SLEEP APNEA (08/19/2006); TRAUMATIC ARTHROPATHY PELVIC REGION AND THIGH (06/29/2009); TOBACCO ABUSE (12/31/2007); Wet senile macular degeneration; Diverticulosis; and Anemia of other chronic disease.  Ricardo Allen  has past surgical history that includes Cataract extraction; Lumbar laminectomy; Lumbar fusion; Tonsilectomy, adenoidectomy, bilateral myringotomy and tubes; Melanoma excision; Esophagogastroduodenoscopy (N/A, 06/20/2012); and Colonoscopy (N/A, 06/21/2012).  Prior to Admission medications   Medication Sig Start Date End Date Taking? Authorizing Provider  alendronate (FOSAMAX) 70 MG tablet Take 70 mg by mouth every Sunday. Take with a full glass of water on an empty stomach.   Yes Historical Provider, MD  allopurinol (ZYLOPRIM) 100 MG tablet Take 1 tablet (100 mg total) by mouth daily. 06/23/12  Yes Alysia Penna, MD  amiodarone (PACERONE) 200 MG tablet Take 1 tablet (200 mg total) by mouth daily. 06/23/12  Yes Alysia Penna, MD  citalopram (CELEXA) 20 MG tablet Take 1 tablet (20 mg total) by mouth daily. 06/23/12  Yes Scott  Holwerda, MD  Cobalamine Combinations (B-6 FOLIC ACID) [phone] MCG-MCG-MG CAPS Take 1 capsule by mouth daily. 06/22/10  Yes Stacie Glaze, MD  colchicine 0.6 MG tablet Take 0.6 mg by mouth daily as needed (for gout).   Yes Historical Provider, MD  docusate sodium 100 MG CAPS Take 100 mg by mouth 2 (two) times daily. 06/23/12  Yes Alysia Penna, MD  ferrous sulfate (FERROUSUL) 325 (65 FE) MG tablet Take 1 tablet (325 mg total) by mouth daily with breakfast.  06/23/12  Yes Alysia Penna, MD  HYDROcodone-acetaminophen (NORCO) 10-325 MG per tablet Take 1 tablet by mouth every 6 (six) hours as needed for pain.   Yes Historical Provider, MD  metoprolol tartrate (LOPRESSOR) 25 MG tablet Take 1 tablet (25 mg total) by mouth 2 (two) times daily. 06/23/12  Yes Alysia Penna, MD  morphine (MS CONTIN) 30 MG 12 hr tablet Take 30 mg by mouth 2 (two) times daily.     Yes Historical Provider, MD  pantoprazole (PROTONIX) 40 MG tablet Take 1 tablet (40 mg total) by mouth daily at 6 (six) AM. 06/23/12  Yes Alysia Penna, MD  polyethylene glycol (MIRALAX / GLYCOLAX) packet Take 17 g by mouth daily as needed (constipation).   Yes Historical Provider, MD  Probiotic Product (PROBIOTIC DAILY PO) Take by mouth.   Yes Historical Provider, MD  Red Yeast Rice 600 MG CAPS Take 1 capsule by mouth daily.   Yes Historical Provider, MD  warfarin (COUMADIN) 2 MG tablet Take 2 tablets by mouth daily. 07/29/12  Yes Historical Provider, MD    Allergies  Allergen Reactions  . Fentanyl     REACTION: nausea and depression  . Hydromorphone Hcl     REACTION: depressed and nausea  . Sulfonamide Derivatives     REACTION: hives    His family history includes Arthritis in his brother; Cancer in his father, maternal aunt, and mother; Pancreatic cancer in his mother; and Testicular cancer in his father.  He  reports that he quit smoking about 4 years ago. His smoking use included Cigarettes. He has a 120 pack-year smoking history. He does not have any smokeless tobacco history on file. He reports that he drinks about 1.8 ounces of alcohol per week. He reports that he does not use illicit drugs.  Review of Systems  Constitutional: Negative for fever and unexpected weight change.  HENT: Positive for congestion and postnasal drip. Negative for ear pain, nosebleeds, sore throat, rhinorrhea, sneezing, trouble swallowing, dental problem and sinus pressure.   Eyes: Negative for redness and  itching.  Respiratory: Positive for shortness of breath. Negative for cough, chest tightness and wheezing.   Cardiovascular: Negative for palpitations and leg swelling.  Gastrointestinal: Negative for nausea and vomiting.  Genitourinary: Negative for dysuria.  Musculoskeletal: Negative for joint swelling.  Skin: Negative for rash.  Neurological: Negative for headaches.  Hematological: Does not bruise/bleed easily.  Psychiatric/Behavioral: Negative for dysphoric mood. The patient is not nervous/anxious.    Physical Exam:  General - No distress, ambulates with cane ENT - No sinus tenderness, MP 3, no oral exudate, no LAN, no thyromegaly, TM clear, pupils equal/reactive Cardiac - s1s2 regular, no murmur, pulses symmetric Chest - No wheeze/rales/dullness, good air entry, normal respiratory excursion Back - No focal tenderness Abd - Soft, non-tender, no organomegaly, + bowel sounds Ext - varus deformity of lower extremities Neuro - Normal strength, cranial nerves intact Skin - No rashes Psych - Normal mood, and behavior

## 2012-08-03 NOTE — Assessment & Plan Note (Signed)
Explained how this could be related to sleep disruption.  Will defer further assessment until after review of his sleep study.

## 2012-08-03 NOTE — Assessment & Plan Note (Signed)
Could be related to sleep disruption from sleep disordered breathing.  Will defer further assessment until after sleep study reviewed.

## 2012-08-03 NOTE — Assessment & Plan Note (Signed)
He has prior history of obstructive sleep apnea.  He has history of atrial fibrillation.  He has episodes in which he stops breathing not associated with snoring.  He is on chronic opiate medications.  He could have either obstructive and/or central sleep apnea.  To further assess will arrange for in lab sleep study.

## 2012-08-03 NOTE — Progress Notes (Deleted)
  Subjective:    Patient ID: Ricardo Allen, male    DOB: 03-Nov-1925, 79 y.o.   MRN: [id number]  HPI    Review of Systems  Constitutional: Negative for fever and unexpected weight change.  HENT: Positive for congestion and postnasal drip. Negative for ear pain, nosebleeds, sore throat, rhinorrhea, sneezing, trouble swallowing, dental problem and sinus pressure.   Eyes: Negative for redness and itching.  Respiratory: Positive for shortness of breath. Negative for cough, chest tightness and wheezing.   Cardiovascular: Negative for palpitations and leg swelling.  Gastrointestinal: Negative for nausea and vomiting.  Genitourinary: Negative for dysuria.  Musculoskeletal: Negative for joint swelling.  Skin: Negative for rash.  Neurological: Negative for headaches.  Hematological: Does not bruise/bleed easily.  Psychiatric/Behavioral: Negative for dysphoric mood. The patient is not nervous/anxious.        Objective:   Physical Exam        Assessment & Plan:

## 2012-08-03 NOTE — Patient Instructions (Signed)
Will arrange for sleep study Will call to arrange for follow up after sleep study reviewed 

## 2012-09-03 ENCOUNTER — Telehealth: Payer: Self-pay | Admitting: Pulmonary Disease

## 2012-09-03 ENCOUNTER — Ambulatory Visit (HOSPITAL_BASED_OUTPATIENT_CLINIC_OR_DEPARTMENT_OTHER): Payer: Medicare Other | Attending: Pulmonary Disease

## 2012-09-03 VITALS — Ht 72.0 in | Wt 173.0 lb

## 2012-09-03 DIAGNOSIS — F514 Sleep terrors [night terrors]: Secondary | ICD-10-CM

## 2012-09-03 DIAGNOSIS — G8929 Other chronic pain: Secondary | ICD-10-CM | POA: Insufficient documentation

## 2012-09-03 DIAGNOSIS — G4730 Sleep apnea, unspecified: Secondary | ICD-10-CM | POA: Insufficient documentation

## 2012-09-03 DIAGNOSIS — Z79899 Other long term (current) drug therapy: Secondary | ICD-10-CM | POA: Insufficient documentation

## 2012-09-03 DIAGNOSIS — I4891 Unspecified atrial fibrillation: Secondary | ICD-10-CM | POA: Insufficient documentation

## 2012-09-03 DIAGNOSIS — G2581 Restless legs syndrome: Secondary | ICD-10-CM | POA: Insufficient documentation

## 2012-09-03 NOTE — Telephone Encounter (Signed)
I spoke with Aurther Loft. She stated vernon wants to know since pt is on CPAP and his HMP has hx of central apneas, does Dr. Craige Cotta want pt to transition to ASV if pt fails CPAP and bilevel. Please advise Dr. Craige Cotta thanks

## 2012-09-03 NOTE — Telephone Encounter (Signed)
Called and spoke with terry at the sleep center---  She is aware of VS recs to try ASV if cpap and bipap fail.  Nothing further is needed.

## 2012-09-03 NOTE — Telephone Encounter (Signed)
Okay to try ASV if CPAP and BiPAP fail.

## 2012-09-04 ENCOUNTER — Telehealth: Payer: Self-pay | Admitting: Pulmonary Disease

## 2012-09-04 ENCOUNTER — Encounter: Payer: Self-pay | Admitting: Pulmonary Disease

## 2012-09-04 DIAGNOSIS — G4733 Obstructive sleep apnea (adult) (pediatric): Secondary | ICD-10-CM

## 2012-09-04 DIAGNOSIS — G4730 Sleep apnea, unspecified: Secondary | ICD-10-CM

## 2012-09-04 DIAGNOSIS — G4731 Primary central sleep apnea: Secondary | ICD-10-CM

## 2012-09-04 NOTE — Telephone Encounter (Addendum)
Split night PSG 09/03/12 >> AHI 74, SpO2 low 87%.  OAI 20.4, CAI 23.2, MAI 8.1.  Sub-optimal titration with CPAP and BiPAP.  PLMI 0.  Will have my nurse inform pt that sleep study shows severe sleep apnea.  This was improved during sleep study with mask/machine, but still had trouble with his sleep apnea.  Options at this time are to arrange for full night titration study with mask/machine to determine optimal set up, or to arrange for ROV to discuss options first in more detail.  Recommendation is to proceed with full night titration study now.  If pt is agreeable to full night titration study, then please arrange for titration study with adaptive servo-ventilator (ASV) for diagnosis of complex sleep apnea.

## 2012-09-05 NOTE — Procedures (Signed)
NAME:  Ricardo Allen, DYKSTRA NO.:  [id number]  MEDICAL RECORD NO.:  [id number]          PATIENT TYPE:  OUT  LOCATION:  SLEEP CENTER                 FACILITY:  [facility]  PHYSICIAN:  Coralyn Helling, MD        DATE OF BIRTH:  [birth year]  DATE OF STUDY:  09/03/2012                           NOCTURNAL POLYSOMNOGRAM  REFERRING PHYSICIAN:  Coralyn Helling, MD  INDICATION FOR STUDY:  Mr. Bos is an 79 year old male who has a history of atrial fibrillation, restless legs syndrome, night terrors, and obstructive sleep apnea.  He also has chronic pain on chronic opioid therapy.  He has symptoms of sleep disruption, snoring, and daytime sleepiness.  He was referred to the sleep lab for evaluation of hypersomnia with obstructive sleep apnea.  Height is 6 feet.  Weight is 173 pounds.  BMI is 23.  Neck size is 15 inches.  EPWORTH SLEEPINESS SCORE:  12.  MEDICATIONS:  Reviewed in his chart.  SLEEP ARCHITECTURE:  The patient followed a split night study protocol. During the diagnostic portion of study, total recording time was 172 minutes.  Total sleep time was 126 minutes.  Sleep efficiency was 73%. Sleep latency was 24 minutes.  This portion of study was notable for lack of slow-wave sleep and REM sleep.  He slept in both the supine and nonsupine positions.  During the titration portion of study, the total recording time was 239 minutes.  Total sleep time was 150 minutes.  Sleep efficiency was 63%. Sleep latency was 1 minute.  This portion of study was notable for lack of slow-wave sleep and REM sleep.  The patient slept in both the supine and nonsupine positions.  RESPIRATORY DATA:  The average rate was 15.  Moderate snoring was noted by the technician.  During the diagnostic portion of study, the overall apnea/hypopnea index was 74.  There were 49 central apneic events. There was 17 mixed respiratory events.  The remainder of the events were obstructive in nature.  The obstructive  apnea index was 20.4, the central apnea index was 23.2, and the mixed apnea index was 8.1.  During the titration portion of study, the patient was started on CPAP of 4 and increased to 14 cm water.  However, in spite of his adjustments, he continued to have both obstructive and central respiratory events.  He was then transitioned to BiPAP starting at 14/12 and increased to 16/13 cm water.  He continued to have respiratory events, with an apnea/hypopnea index of 10.9 in spite of these adjustments.  OXYGEN DATA:  The baseline oxygenation was 91%.  The oxygen saturation nadir was 87%.  The patient had improved control of his oxygenation with BiPAP therapy.  The study was conducted without the use of supplemental oxygen.  CARDIAC DATA:  The average heart rate was 68 and the rhythm strip showed sinus rhythm with occasional PVCs and PACs.  MOVEMENT-PARASOMNIA:  The patient had 1 restroom trip.  The periodic limb movement index was 0.  IMPRESSIONS-RECOMMENDATIONS:  This study shows evidence for severe complex sleep apnea with an overall apnea/hypopnea index was 74 and oxygen saturation nadir of 87%.  He had elevation in  his obstructive and central apnea index.  He had a suboptimal titration portion of the study with attempt to both CPAP and BiPAP therapy.  I would recommend the patient return to the sleep lab for a full night titration study.  I would recommend the patient be tried on Adaptive servo ventilator for his complex sleep apnea in the setting of chronic opioid therapy.  Again, he did not have successful titration with attempted sinus CPAP therapy or BiPAP therapy.     Coralyn Helling, MD Diplomat, American Board of Sleep Medicine    VS/MEDQ  D:  09/04/2012 17:57:35  T:  09/05/2012 02:24:38  Job:  [id number]

## 2012-09-07 NOTE — Telephone Encounter (Signed)
Pt is aware of sleep study results. When advised that he should do the titration sleep study he got very angry and stated, "For heaven's sake, another sleep study!" He asked that we not contact him about this until next week. Order has been placed.

## 2012-09-11 ENCOUNTER — Encounter (INDEPENDENT_AMBULATORY_CARE_PROVIDER_SITE_OTHER): Payer: Medicare Other | Admitting: Ophthalmology

## 2012-09-11 DIAGNOSIS — H43819 Vitreous degeneration, unspecified eye: Secondary | ICD-10-CM

## 2012-09-11 DIAGNOSIS — H353290 Exudative age-related macular degeneration, unspecified eye, stage unspecified: Secondary | ICD-10-CM

## 2012-09-11 DIAGNOSIS — H3530 Unspecified macular degeneration: Secondary | ICD-10-CM

## 2012-09-25 ENCOUNTER — Telehealth: Payer: Self-pay | Admitting: Pulmonary Disease

## 2012-09-25 NOTE — Telephone Encounter (Signed)
If pt is agreeable to full night titration study, then please arrange for titration study with adaptive servo-ventilator (ASV) for diagnosis of complex sleep apnea.  I spoke with Ricardo Allen and made her aware. Nothing further needed

## 2012-09-30 ENCOUNTER — Ambulatory Visit (HOSPITAL_BASED_OUTPATIENT_CLINIC_OR_DEPARTMENT_OTHER): Payer: Medicare Other | Attending: Pulmonary Disease

## 2012-09-30 DIAGNOSIS — G4731 Primary central sleep apnea: Secondary | ICD-10-CM

## 2012-09-30 DIAGNOSIS — G4733 Obstructive sleep apnea (adult) (pediatric): Secondary | ICD-10-CM | POA: Insufficient documentation

## 2012-09-30 DIAGNOSIS — I4891 Unspecified atrial fibrillation: Secondary | ICD-10-CM | POA: Insufficient documentation

## 2012-10-06 DIAGNOSIS — G4733 Obstructive sleep apnea (adult) (pediatric): Secondary | ICD-10-CM

## 2012-10-06 NOTE — Procedures (Signed)
NAME:  Ricardo Allen, Ricardo Allen NO.:  [id number]  MEDICAL RECORD NO.:  [id number]          PATIENT TYPE:  OUT  LOCATION:  SLEEP CENTER                 FACILITY:  [facility]  PHYSICIAN:  Coralyn Helling, MD        DATE OF BIRTH:  01/19/1925  DATE OF STUDY:  09/30/2012                           NOCTURNAL POLYSOMNOGRAM  REFERRING PHYSICIAN:  Coralyn Helling, MD  FACILITY:  [facility].  REFERRING PHYSICIAN:  Coralyn Helling, MD  INDICATION:  [name] is a 79 year old male, who has a history of atrial fibrillation, restless legs syndrome, and night terrors.  He was found to have severe complex sleep apnea after having a sleep study on September 03, 2012.  This showed an overall apnea/hypopnea index of 74 with an oxygen saturation nadir of 87%.  He had suboptimal control with persistence of both obstructive and central events with CPAP therapy and BiPAP therapy.  He is referred back to the Sleep Lab for a titration study using Adaptive Servo ventilator.  Height is 5 feet 4 inches, weight is 156 pounds, BMI is 27, neck size is 13 inches. Medications reviewed in the chart.  EPWORTH SLEEPINESS SCORE:  7.  SLEEP ARCHITECTURE:  Total recording time is 407 minutes.  Total sleep time is 280 minutes.  Sleep efficiency was 69%.  Sleep latency was 67 minutes.  REM latency was 337 minutes.  The study was notable for lack of slow-wave sleep and reduction in the percentage of REM sleep.  He slept both in the supine and nonsupine positions.  RESPIRATORY DATA:  The average respiratory rate was 17.  Moderate snoring was noted by the technician.  The patient was titrated up on his Adaptive Servo ventilator to a maximal pressure support of 15 cm of water, a minimal pressure support of 3 cm water, and a end positive airway pressure (EPAP) of 9 cm water.  With these settings, his apnea- hypopnea index was reduced to 3.2.  OXYGEN DATA:  The baseline oxygenation was 98%.  The oxygen saturation nadir  was 81%.  The patient had good control of his oxygenation with final settings with the Adaptive Servo ventilator.  The study was conducted without the use of supplemental oxygen.  CARDIAC DATA:  The average heart rate was 72 and the rhythm strip showed sinus rhythm.  MOVEMENT/PARASOMNIA:  The periodic limb movement index was 0 and the patient had no restroom trips.  IMPRESSION:  This was a successful titration study using Adaptive Servo ventilator.  He did well with final settings of maximum pressure support of 15 cm water, minimum pressure support of 3 cm water, and EPAP of 9 cm of water.  He was fitted with a large-sized [employer] full- face mask.     Coralyn Helling, MD Diplomat, American Board of Sleep Medicine    VS/MEDQ  D:  10/06/2012 10:23:13  T:  10/06/2012 12:37:08  Job:  [id number]

## 2012-10-07 ENCOUNTER — Telehealth: Payer: Self-pay | Admitting: Pulmonary Disease

## 2012-10-07 DIAGNOSIS — G4731 Primary central sleep apnea: Secondary | ICD-10-CM

## 2012-10-07 NOTE — Telephone Encounter (Signed)
ASV titration 09/30/12 >> AHI 3.2 with max PS 15, min PS 3, EPAP 9 cm H2O.  Will have my nurse inform pt that sleep study showed good control of apnea with ASV.    I have place order for ASV set up.  He will need ROV 2 months after ASV set up.

## 2012-10-08 ENCOUNTER — Encounter (INDEPENDENT_AMBULATORY_CARE_PROVIDER_SITE_OTHER): Payer: Medicare Other | Admitting: Ophthalmology

## 2012-10-08 DIAGNOSIS — H353290 Exudative age-related macular degeneration, unspecified eye, stage unspecified: Secondary | ICD-10-CM

## 2012-10-08 DIAGNOSIS — H43819 Vitreous degeneration, unspecified eye: Secondary | ICD-10-CM

## 2012-10-08 DIAGNOSIS — H33309 Unspecified retinal break, unspecified eye: Secondary | ICD-10-CM

## 2012-10-08 NOTE — Telephone Encounter (Signed)
Pt's wife is aware of sleep study. Order has been placed for ASV.

## 2012-10-14 ENCOUNTER — Telehealth: Payer: Self-pay | Admitting: Pulmonary Disease

## 2012-10-14 NOTE — Telephone Encounter (Signed)
Will forward to Dr Sood as FYI  

## 2012-10-15 NOTE — Telephone Encounter (Signed)
Noted  

## 2012-10-30 ENCOUNTER — Telehealth: Payer: Self-pay | Admitting: Pulmonary Disease

## 2012-10-30 DIAGNOSIS — G4731 Primary central sleep apnea: Secondary | ICD-10-CM

## 2012-10-30 NOTE — Telephone Encounter (Signed)
I spoke with pt. He reports his machine is making terrible noises at night according to his wife. He thinks it may be due to the pressure blowing out too much air in the mask. He would wake up at night bc the pressure did feel a bit too much. He reports he went back to his old CPAP a couple days ago. He is wanting an order to be sent to decrease his CPAP settings for him to restart using that machine again. Until then he will use his old machine (unsure of the settings on that machine). Please advise Dr. Craige Cotta thanks

## 2012-10-30 NOTE — Telephone Encounter (Signed)
Please inform patient that I have sent order to change his ASV settings.

## 2012-10-30 NOTE — Telephone Encounter (Signed)
Called, spoke with pt.  Informed him of below per VS.  He verbalized understanding and voiced no further questions or concerns at this time.

## 2012-11-03 ENCOUNTER — Telehealth: Payer: Self-pay | Admitting: Pulmonary Disease

## 2012-11-03 NOTE — Telephone Encounter (Signed)
Called and spoke with pt and he was calling back to see if we have figured out what is wrong with his bipap machine.  i have sent a message to [facility] to find out if they have received the order to adjust his settings.

## 2012-11-04 NOTE — Telephone Encounter (Signed)
Per Efraim Kaufmann from [facility]--   We called this morning and left a message on this patient's home number. I've verified that our contact information matches what is in his Epic chart.  [facility] will follow up with pt about his bipap.

## 2012-11-05 ENCOUNTER — Encounter (INDEPENDENT_AMBULATORY_CARE_PROVIDER_SITE_OTHER): Payer: Medicare Other | Admitting: Ophthalmology

## 2012-11-05 DIAGNOSIS — H353290 Exudative age-related macular degeneration, unspecified eye, stage unspecified: Secondary | ICD-10-CM

## 2012-11-05 DIAGNOSIS — H33309 Unspecified retinal break, unspecified eye: Secondary | ICD-10-CM

## 2012-11-05 DIAGNOSIS — H43819 Vitreous degeneration, unspecified eye: Secondary | ICD-10-CM

## 2012-11-19 ENCOUNTER — Other Ambulatory Visit: Payer: Self-pay

## 2012-12-01 ENCOUNTER — Encounter: Payer: Self-pay | Admitting: Pulmonary Disease

## 2012-12-04 ENCOUNTER — Encounter (INDEPENDENT_AMBULATORY_CARE_PROVIDER_SITE_OTHER): Payer: Medicare Other | Admitting: Ophthalmology

## 2012-12-04 DIAGNOSIS — H33309 Unspecified retinal break, unspecified eye: Secondary | ICD-10-CM

## 2012-12-04 DIAGNOSIS — H43819 Vitreous degeneration, unspecified eye: Secondary | ICD-10-CM

## 2012-12-04 DIAGNOSIS — H353290 Exudative age-related macular degeneration, unspecified eye, stage unspecified: Secondary | ICD-10-CM

## 2012-12-22 ENCOUNTER — Telehealth: Payer: Self-pay | Admitting: *Deleted

## 2012-12-22 NOTE — Telephone Encounter (Signed)
i will mail a letter/avs informing the pt about his appts for 01/22/13 with labs@2 :45p and ov@ 3:15p...td

## 2013-01-01 ENCOUNTER — Encounter (INDEPENDENT_AMBULATORY_CARE_PROVIDER_SITE_OTHER): Payer: Medicare Other | Admitting: Ophthalmology

## 2013-01-01 DIAGNOSIS — H353290 Exudative age-related macular degeneration, unspecified eye, stage unspecified: Secondary | ICD-10-CM

## 2013-01-01 DIAGNOSIS — H33309 Unspecified retinal break, unspecified eye: Secondary | ICD-10-CM

## 2013-01-01 DIAGNOSIS — H43819 Vitreous degeneration, unspecified eye: Secondary | ICD-10-CM

## 2013-01-21 ENCOUNTER — Other Ambulatory Visit: Payer: Self-pay | Admitting: Hematology and Oncology

## 2013-01-21 DIAGNOSIS — D649 Anemia, unspecified: Secondary | ICD-10-CM

## 2013-01-22 ENCOUNTER — Other Ambulatory Visit (HOSPITAL_BASED_OUTPATIENT_CLINIC_OR_DEPARTMENT_OTHER): Payer: Medicare Other

## 2013-01-22 ENCOUNTER — Ambulatory Visit (HOSPITAL_BASED_OUTPATIENT_CLINIC_OR_DEPARTMENT_OTHER): Payer: Medicare Other | Admitting: Hematology and Oncology

## 2013-01-22 ENCOUNTER — Other Ambulatory Visit: Payer: Medicare Other | Admitting: Lab

## 2013-01-22 ENCOUNTER — Telehealth: Payer: Self-pay | Admitting: Hematology and Oncology

## 2013-01-22 ENCOUNTER — Ambulatory Visit: Payer: Medicare Other

## 2013-01-22 VITALS — BP 130/57 | HR 66 | Temp 98.1°F | Resp 17 | Ht 72.0 in | Wt 176.7 lb

## 2013-01-22 DIAGNOSIS — D649 Anemia, unspecified: Secondary | ICD-10-CM

## 2013-01-22 DIAGNOSIS — D631 Anemia in chronic kidney disease: Secondary | ICD-10-CM

## 2013-01-22 DIAGNOSIS — N189 Chronic kidney disease, unspecified: Secondary | ICD-10-CM

## 2013-01-22 DIAGNOSIS — Z85820 Personal history of malignant melanoma of skin: Secondary | ICD-10-CM

## 2013-01-22 DIAGNOSIS — C4359 Malignant melanoma of other part of trunk: Secondary | ICD-10-CM

## 2013-01-22 DIAGNOSIS — N039 Chronic nephritic syndrome with unspecified morphologic changes: Secondary | ICD-10-CM

## 2013-01-22 LAB — CBC & DIFF AND RETIC
BASO%: 0.4 % (ref 0.0–2.0)
Basophils Absolute: 0 10*3/uL (ref 0.0–0.1)
EOS ABS: 0.3 10*3/uL (ref 0.0–0.5)
EOS%: 4.5 % (ref 0.0–7.0)
HEMATOCRIT: 33.4 % — AB (ref 38.4–49.9)
HEMOGLOBIN: 10.4 g/dL — AB (ref 13.0–17.1)
IMMATURE RETIC FRACT: 5 % (ref 3.00–10.60)
LYMPH%: 23.9 % (ref 14.0–49.0)
MCH: 29.8 pg (ref 27.2–33.4)
MCHC: 31.1 g/dL — ABNORMAL LOW (ref 32.0–36.0)
MCV: 95.7 fL (ref 79.3–98.0)
MONO#: 0.5 10*3/uL (ref 0.1–0.9)
MONO%: 6.8 % (ref 0.0–14.0)
NEUT%: 64.4 % (ref 39.0–75.0)
NEUTROS ABS: 4.9 10*3/uL (ref 1.5–6.5)
Platelets: 188 10*3/uL (ref 140–400)
RBC: 3.49 10*6/uL — ABNORMAL LOW (ref 4.20–5.82)
RDW: 14.9 % — AB (ref 11.0–14.6)
RETIC CT ABS: 49.21 10*3/uL (ref 34.80–93.90)
Retic %: 1.41 % (ref 0.80–1.80)
WBC: 7.6 10*3/uL (ref 4.0–10.3)
lymph#: 1.8 10*3/uL (ref 0.9–3.3)

## 2013-01-22 LAB — COMPREHENSIVE METABOLIC PANEL (CC13)
ALT: 26 U/L (ref 0–55)
AST: 35 U/L — AB (ref 5–34)
Albumin: 3.9 g/dL (ref 3.5–5.0)
Alkaline Phosphatase: 104 U/L (ref 40–150)
Anion Gap: 9 mEq/L (ref 3–11)
BUN: 37.9 mg/dL — ABNORMAL HIGH (ref 7.0–26.0)
CALCIUM: 9.2 mg/dL (ref 8.4–10.4)
CHLORIDE: 105 meq/L (ref 98–109)
CO2: 26 meq/L (ref 22–29)
Creatinine: 2 mg/dL — ABNORMAL HIGH (ref 0.7–1.3)
Glucose: 98 mg/dl (ref 70–140)
POTASSIUM: 5.3 meq/L — AB (ref 3.5–5.1)
Sodium: 140 mEq/L (ref 136–145)
Total Bilirubin: 0.37 mg/dL (ref 0.20–1.20)
Total Protein: 7.2 g/dL (ref 6.4–8.3)

## 2013-01-22 LAB — IRON AND TIBC CHCC
%SAT: 19 % — ABNORMAL LOW (ref 20–55)
Iron: 49 ug/dL (ref 42–163)
TIBC: 255 ug/dL (ref 202–409)
UIBC: 206 ug/dL (ref 117–376)

## 2013-01-22 LAB — FERRITIN CHCC: Ferritin: 556 ng/ml — ABNORMAL HIGH (ref 22–316)

## 2013-01-22 NOTE — Telephone Encounter (Signed)
gv pt appt schedule for january 2016 and appt w/Dr. Tonia Brooms 06/03/13 @ 10:15am.

## 2013-01-22 NOTE — Progress Notes (Signed)
Ricardo Allen OFFICE PROGRESS NOTE  Ricardo Hatchet, MD DIAGNOSIS:  Anemia chronic disease  SUMMARY OF HEMATOLOGIC HISTORY: This is a pleasant 67 your gentleman with background history of chronic kidney disease and anemia chronic disease. In June of 2014, he had bone marrow aspirate and biopsy which rule out malignancy. He also had history of melanoma resected in 2006 but have not seen a dermatologist for long time INTERVAL HISTORY: Ricardo Allen 80 y.o. male returns for further followup. He's doing very well. Denies any dizziness, shortness of breath or fatigue. The patient denies any recent signs or symptoms of bleeding such as spontaneous epistaxis, hematuria or hematochezia.  I have reviewed the past medical history, past surgical history, social history and family history with the patient and they are unchanged from previous note.  ALLERGIES:  is allergic to fentanyl; hydromorphone hcl; and sulfonamide derivatives.  MEDICATIONS:  Current Outpatient Prescriptions  Medication Sig Dispense Refill  . alendronate (FOSAMAX) 70 MG tablet Take 70 mg by mouth every Sunday. Take with a full glass of water on an empty stomach.      Marland Kitchen allopurinol (ZYLOPRIM) 100 MG tablet Take 1 tablet (100 mg total) by mouth daily.  30 tablet  5  . amiodarone (PACERONE) 200 MG tablet Take 1 tablet (200 mg total) by mouth daily.  30 tablet  5  . citalopram (CELEXA) 20 MG tablet Take 1 tablet (20 mg total) by mouth daily.  30 tablet  5  . Cobalamine Combinations (B-6 FOLIC ACID) 564-3329-51 MCG-MCG-MG CAPS Take 1 capsule by mouth daily.  30 each  11  . colchicine 0.6 MG tablet Take 0.6 mg by mouth daily as needed (for gout).      Marland Kitchen docusate sodium 100 MG CAPS Take 100 mg by mouth 2 (two) times daily.  60 capsule  5  . ferrous sulfate (FERROUSUL) 325 (65 FE) MG tablet Take 1 tablet (325 mg total) by mouth daily with breakfast.  30 tablet  5  . HYDROcodone-acetaminophen (NORCO) 10-325 MG per tablet Take  1 tablet by mouth every 6 (six) hours as needed for pain.      . metoprolol tartrate (LOPRESSOR) 25 MG tablet Take 1 tablet (25 mg total) by mouth 2 (two) times daily.  60 tablet  5  . morphine (MS CONTIN) 30 MG 12 hr tablet Take 30 mg by mouth 2 (two) times daily.        . pantoprazole (PROTONIX) 40 MG tablet Take 1 tablet (40 mg total) by mouth daily at 6 (six) AM.  30 tablet  5  . polyethylene glycol (MIRALAX / GLYCOLAX) packet Take 17 g by mouth daily as needed (constipation).      . Probiotic Product (PROBIOTIC DAILY PO) Take by mouth.      . warfarin (COUMADIN) 2 MG tablet Take 2 tablets by mouth daily.       No current facility-administered medications for this visit.     REVIEW OF SYSTEMS:   Constitutional: Denies fevers, chills or night sweats Behavioral/Psych: Mood is stable, no new changes  All other systems were reviewed with the patient and are negative.  PHYSICAL EXAMINATION: ECOG PERFORMANCE STATUS: 1 - Symptomatic but completely ambulatory  Filed Vitals:   01/22/13 1453  BP: 130/57  Pulse: 66  Temp: 98.1 F (36.7 C)  Resp: 17   Filed Weights   01/22/13 1453  Weight: 176 lb 11.2 oz (80.151 kg)    GENERAL:alert, no distress and comfortable. He looked elderly  SKIN: skin color, texture, turgor are normal, no rashes or significant lesions Musculoskeletal:no cyanosis of digits and no clubbing  NEURO: alert & oriented x 3 with fluent speech, no focal motor/sensory deficits  LABORATORY DATA:  I have reviewed the data as listed Results for orders placed in visit on 01/22/13 (from the past 48 hour(s))  CBC & DIFF AND RETIC     Status: Abnormal   Collection Time    01/22/13  2:26 PM      Result Value Range   WBC 7.6  4.0 - 10.3 10e3/uL   NEUT# 4.9  1.5 - 6.5 10e3/uL   HGB 10.4 (*) 13.0 - 17.1 g/dL   HCT 33.4 (*) 38.4 - 49.9 %   Platelets 188  140 - 400 10e3/uL   MCV 95.7  79.3 - 98.0 fL   MCH 29.8  27.2 - 33.4 pg   MCHC 31.1 (*) 32.0 - 36.0 g/dL   RBC 3.49 (*)  4.20 - 5.82 10e6/uL   RDW 14.9 (*) 11.0 - 14.6 %   lymph# 1.8  0.9 - 3.3 10e3/uL   MONO# 0.5  0.1 - 0.9 10e3/uL   Eosinophils Absolute 0.3  0.0 - 0.5 10e3/uL   Basophils Absolute 0.0  0.0 - 0.1 10e3/uL   NEUT% 64.4  39.0 - 75.0 %   LYMPH% 23.9  14.0 - 49.0 %   MONO% 6.8  0.0 - 14.0 %   EOS% 4.5  0.0 - 7.0 %   BASO% 0.4  0.0 - 2.0 %   Retic % 1.41  0.80 - 1.80 %   Retic Ct Abs 49.21  34.80 - 93.90 10e3/uL   Immature Retic Fract 5.00  3.00 - 10.60 %  FERRITIN [facility]     Status: Abnormal   Collection Time    01/22/13  2:26 PM      Result Value Range   Ferritin 556 (*) 22 - 316 ng/ml  IRON AND TIBC [facility]     Status: Abnormal   Collection Time    01/22/13  2:26 PM      Result Value Range   Iron 49  42 - 163 ug/dL   TIBC 255  202 - 409 ug/dL   UIBC 206  117 - 376 ug/dL   %SAT 19 (*) 20 - 55 %  COMPREHENSIVE METABOLIC PANEL (ZC58)     Status: Abnormal   Collection Time    01/22/13  2:26 PM      Result Value Range   Sodium 140  136 - 145 mEq/L   Potassium 5.3 (*) 3.5 - 5.1 mEq/L   Chloride 105  98 - 109 mEq/L   CO2 26  22 - 29 mEq/L   Glucose 98  70 - 140 mg/dl   BUN 37.9 (*) 7.0 - 26.0 mg/dL   Creatinine 2.0 (*) 0.7 - 1.3 mg/dL   Total Bilirubin 0.37  0.20 - 1.20 mg/dL   Alkaline Phosphatase 104  40 - 150 U/L   AST 35 (*) 5 - 34 U/L   ALT 26  0 - 55 U/L   Total Protein 7.2  6.4 - 8.3 g/dL   Albumin 3.9  3.5 - 5.0 g/dL   Calcium 9.2  8.4 - 10.4 mg/dL   Anion Gap 9  3 - 11 mEq/L    Lab Results  Component Value Date   WBC 7.6 01/22/2013   HGB 10.4* 01/22/2013   HCT 33.4* 01/22/2013   MCV 95.7 01/22/2013   PLT 188 01/22/2013  ASSESSMENT & PLAN:  #1 anemia This is likely anemia of chronic disease. The patient denies recent history of bleeding such as epistaxis, hematuria or hematochezia. He is asymptomatic from the anemia. We will observe for now.  He does not require transfusion now.  #2 chronic kidney disease The patient to continue monitoring her primary care provider #3 iron  overload Recommended patient to discontinue iron supplement #4 history of melanoma Staging is unknown. I will place an order for him back to dermatologist for evaluation All questions were answered. The patient knows to call the clinic with any problems, questions or concerns. No barriers to learning was detected.  I spent 15 minutes counseling the patient face to face. The total time spent in the appointment was 20 minutes and more than 50% was on counseling.     [facility], [place] Dezza, MD 01/22/2013 4:07 PM

## 2013-01-29 ENCOUNTER — Encounter (INDEPENDENT_AMBULATORY_CARE_PROVIDER_SITE_OTHER): Payer: Medicare Other | Admitting: Ophthalmology

## 2013-01-29 DIAGNOSIS — H353290 Exudative age-related macular degeneration, unspecified eye, stage unspecified: Secondary | ICD-10-CM

## 2013-01-29 DIAGNOSIS — H43819 Vitreous degeneration, unspecified eye: Secondary | ICD-10-CM

## 2013-03-01 ENCOUNTER — Encounter (INDEPENDENT_AMBULATORY_CARE_PROVIDER_SITE_OTHER): Payer: Medicare Other | Admitting: Ophthalmology

## 2013-03-10 ENCOUNTER — Encounter (INDEPENDENT_AMBULATORY_CARE_PROVIDER_SITE_OTHER): Payer: Medicare Other | Admitting: Ophthalmology

## 2013-03-10 DIAGNOSIS — H353290 Exudative age-related macular degeneration, unspecified eye, stage unspecified: Secondary | ICD-10-CM

## 2013-03-10 DIAGNOSIS — H43819 Vitreous degeneration, unspecified eye: Secondary | ICD-10-CM

## 2013-03-18 ENCOUNTER — Telehealth: Payer: Self-pay | Admitting: *Deleted

## 2013-03-18 ENCOUNTER — Other Ambulatory Visit: Payer: Self-pay | Admitting: Hematology and Oncology

## 2013-03-18 DIAGNOSIS — D649 Anemia, unspecified: Secondary | ICD-10-CM

## 2013-03-18 NOTE — Telephone Encounter (Signed)
Per Dr. Alvy Bimler;  1.  Pt Not to take any iron supplements.  2.  Pt does not need to have his CBC checked routinely,  Ok to wait for 8 months for next lab here unless pt has a change in condition.   If pt has any change in condition,  Please call us and we can check his CBC sooner if needed.   Above instructions given to wife and she verbalized understanding.  She asks for [place] to inform Dr. Ardeth Perfect of Dr. Calton Dach instructions.  Last office note and a copy of this note faxed to Dr. Ardeth Perfect at fax [phone].

## 2013-03-18 NOTE — Telephone Encounter (Signed)
No problem I made him appt to see me in 8 months

## 2013-03-18 NOTE — Telephone Encounter (Signed)
Wife states pt saw his PCP, Dr. Ardeth Perfect, recently.  Hgb was 10.0 and PCP instructed pt to call Dr. Alvy Bimler again.   Wife says she thinks Dr. Alvy Bimler released pt back to PCP for his anemia but she thinks PCP wants Dr. Alvy Bimler to continue to monitor and manage the anemia.  She asks if pt can have his anemia managed/monitored by Dr. Alvy Bimler?

## 2013-03-19 ENCOUNTER — Telehealth: Payer: Self-pay | Admitting: Hematology and Oncology

## 2013-03-19 NOTE — Telephone Encounter (Signed)
s.w. pt and advised on NOV appt....pt ok and aware °

## 2013-03-28 ENCOUNTER — Emergency Department (HOSPITAL_COMMUNITY)
Admission: EM | Admit: 2013-03-28 | Discharge: 2013-03-28 | Disposition: A | Discharge status: Home / Self Care | Payer: Medicare Other | Source: Home / Self Care | Attending: Emergency Medicine | Admitting: Emergency Medicine

## 2013-03-28 ENCOUNTER — Encounter (HOSPITAL_COMMUNITY): Payer: Self-pay | Admitting: Emergency Medicine

## 2013-03-28 ENCOUNTER — Emergency Department (HOSPITAL_COMMUNITY): Payer: Medicare Other

## 2013-03-28 DIAGNOSIS — S7010XA Contusion of unspecified thigh, initial encounter: Secondary | ICD-10-CM | POA: Diagnosis present

## 2013-03-28 DIAGNOSIS — Z8709 Personal history of other diseases of the respiratory system: Secondary | ICD-10-CM | POA: Insufficient documentation

## 2013-03-28 DIAGNOSIS — Z85820 Personal history of malignant melanoma of skin: Secondary | ICD-10-CM

## 2013-03-28 DIAGNOSIS — E785 Hyperlipidemia, unspecified: Secondary | ICD-10-CM | POA: Diagnosis present

## 2013-03-28 DIAGNOSIS — D638 Anemia in other chronic diseases classified elsewhere: Secondary | ICD-10-CM | POA: Insufficient documentation

## 2013-03-28 DIAGNOSIS — Y9389 Activity, other specified: Secondary | ICD-10-CM | POA: Insufficient documentation

## 2013-03-28 DIAGNOSIS — E860 Dehydration: Secondary | ICD-10-CM | POA: Diagnosis present

## 2013-03-28 DIAGNOSIS — H9190 Unspecified hearing loss, unspecified ear: Secondary | ICD-10-CM | POA: Diagnosis present

## 2013-03-28 DIAGNOSIS — IMO0002 Reserved for concepts with insufficient information to code with codable children: Secondary | ICD-10-CM

## 2013-03-28 DIAGNOSIS — D649 Anemia, unspecified: Secondary | ICD-10-CM

## 2013-03-28 DIAGNOSIS — N183 Chronic kidney disease, stage 3 unspecified: Secondary | ICD-10-CM | POA: Diagnosis present

## 2013-03-28 DIAGNOSIS — F329 Major depressive disorder, single episode, unspecified: Secondary | ICD-10-CM | POA: Insufficient documentation

## 2013-03-28 DIAGNOSIS — Z87891 Personal history of nicotine dependence: Secondary | ICD-10-CM

## 2013-03-28 DIAGNOSIS — Y939 Activity, unspecified: Secondary | ICD-10-CM

## 2013-03-28 DIAGNOSIS — I4891 Unspecified atrial fibrillation: Secondary | ICD-10-CM | POA: Diagnosis present

## 2013-03-28 DIAGNOSIS — W1809XA Striking against other object with subsequent fall, initial encounter: Secondary | ICD-10-CM | POA: Insufficient documentation

## 2013-03-28 DIAGNOSIS — M109 Gout, unspecified: Secondary | ICD-10-CM | POA: Diagnosis present

## 2013-03-28 DIAGNOSIS — I619 Nontraumatic intracerebral hemorrhage, unspecified: Secondary | ICD-10-CM | POA: Diagnosis present

## 2013-03-28 DIAGNOSIS — Z7901 Long term (current) use of anticoagulants: Secondary | ICD-10-CM | POA: Insufficient documentation

## 2013-03-28 DIAGNOSIS — H5410 Blindness, one eye, low vision other eye, unspecified eyes: Secondary | ICD-10-CM | POA: Diagnosis present

## 2013-03-28 DIAGNOSIS — Z7983 Long term (current) use of bisphosphonates: Secondary | ICD-10-CM

## 2013-03-28 DIAGNOSIS — N189 Chronic kidney disease, unspecified: Secondary | ICD-10-CM | POA: Insufficient documentation

## 2013-03-28 DIAGNOSIS — K5900 Constipation, unspecified: Secondary | ICD-10-CM | POA: Diagnosis present

## 2013-03-28 DIAGNOSIS — F10939 Alcohol use, unspecified with withdrawal, unspecified: Secondary | ICD-10-CM | POA: Diagnosis present

## 2013-03-28 DIAGNOSIS — Z9849 Cataract extraction status, unspecified eye: Secondary | ICD-10-CM

## 2013-03-28 DIAGNOSIS — D631 Anemia in chronic kidney disease: Secondary | ICD-10-CM | POA: Diagnosis present

## 2013-03-28 DIAGNOSIS — Z8719 Personal history of other diseases of the digestive system: Secondary | ICD-10-CM

## 2013-03-28 DIAGNOSIS — M5106 Intervertebral disc disorders with myelopathy, lumbar region: Secondary | ICD-10-CM | POA: Insufficient documentation

## 2013-03-28 DIAGNOSIS — R000 Tachycardia, unspecified: Secondary | ICD-10-CM

## 2013-03-28 DIAGNOSIS — Z981 Arthrodesis status: Secondary | ICD-10-CM

## 2013-03-28 DIAGNOSIS — H353290 Exudative age-related macular degeneration, unspecified eye, stage unspecified: Secondary | ICD-10-CM | POA: Diagnosis present

## 2013-03-28 DIAGNOSIS — G8929 Other chronic pain: Secondary | ICD-10-CM | POA: Diagnosis present

## 2013-03-28 DIAGNOSIS — F3289 Other specified depressive episodes: Secondary | ICD-10-CM | POA: Insufficient documentation

## 2013-03-28 DIAGNOSIS — G4733 Obstructive sleep apnea (adult) (pediatric): Secondary | ICD-10-CM | POA: Diagnosis present

## 2013-03-28 DIAGNOSIS — F10239 Alcohol dependence with withdrawal, unspecified: Secondary | ICD-10-CM | POA: Diagnosis present

## 2013-03-28 DIAGNOSIS — S0003XA Contusion of scalp, initial encounter: Secondary | ICD-10-CM

## 2013-03-28 DIAGNOSIS — Z79899 Other long term (current) drug therapy: Secondary | ICD-10-CM

## 2013-03-28 DIAGNOSIS — Y92009 Unspecified place in unspecified non-institutional (private) residence as the place of occurrence of the external cause: Secondary | ICD-10-CM

## 2013-03-28 DIAGNOSIS — R4182 Altered mental status, unspecified: Secondary | ICD-10-CM

## 2013-03-28 DIAGNOSIS — Z9181 History of falling: Secondary | ICD-10-CM

## 2013-03-28 DIAGNOSIS — F1020 Alcohol dependence, uncomplicated: Secondary | ICD-10-CM | POA: Diagnosis present

## 2013-03-28 DIAGNOSIS — T07XXXA Unspecified multiple injuries, initial encounter: Secondary | ICD-10-CM

## 2013-03-28 DIAGNOSIS — I959 Hypotension, unspecified: Secondary | ICD-10-CM | POA: Diagnosis present

## 2013-03-28 DIAGNOSIS — W19XXXA Unspecified fall, initial encounter: Secondary | ICD-10-CM | POA: Diagnosis present

## 2013-03-28 DIAGNOSIS — D72829 Elevated white blood cell count, unspecified: Secondary | ICD-10-CM | POA: Diagnosis present

## 2013-03-28 DIAGNOSIS — Z8669 Personal history of other diseases of the nervous system and sense organs: Secondary | ICD-10-CM

## 2013-03-28 DIAGNOSIS — G9341 Metabolic encephalopathy: Secondary | ICD-10-CM | POA: Diagnosis present

## 2013-03-28 DIAGNOSIS — I6529 Occlusion and stenosis of unspecified carotid artery: Secondary | ICD-10-CM | POA: Diagnosis present

## 2013-03-28 DIAGNOSIS — M4120 Other idiopathic scoliosis, site unspecified: Secondary | ICD-10-CM | POA: Diagnosis present

## 2013-03-28 DIAGNOSIS — D62 Acute posthemorrhagic anemia: Principal | ICD-10-CM | POA: Diagnosis present

## 2013-03-28 DIAGNOSIS — M1990 Unspecified osteoarthritis, unspecified site: Secondary | ICD-10-CM | POA: Diagnosis present

## 2013-03-28 DIAGNOSIS — R55 Syncope and collapse: Secondary | ICD-10-CM | POA: Insufficient documentation

## 2013-03-28 DIAGNOSIS — S301XXA Contusion of abdominal wall, initial encounter: Secondary | ICD-10-CM | POA: Diagnosis present

## 2013-03-28 DIAGNOSIS — R5381 Other malaise: Secondary | ICD-10-CM | POA: Diagnosis present

## 2013-03-28 DIAGNOSIS — N039 Chronic nephritic syndrome with unspecified morphologic changes: Secondary | ICD-10-CM

## 2013-03-28 DIAGNOSIS — S1093XA Contusion of unspecified part of neck, initial encounter: Secondary | ICD-10-CM

## 2013-03-28 DIAGNOSIS — S0083XA Contusion of other part of head, initial encounter: Secondary | ICD-10-CM

## 2013-03-28 LAB — BASIC METABOLIC PANEL
BUN: 30 mg/dL — ABNORMAL HIGH (ref 6–23)
CHLORIDE: 100 meq/L (ref 96–112)
CO2: 22 mEq/L (ref 19–32)
Calcium: 8.8 mg/dL (ref 8.4–10.5)
Creatinine, Ser: 1.66 mg/dL — ABNORMAL HIGH (ref 0.50–1.35)
GFR calc Af Amer: 42 mL/min — ABNORMAL LOW (ref >90)
GFR calc non Af Amer: 36 mL/min — ABNORMAL LOW (ref >90)
Glucose, Bld: 117 mg/dL — ABNORMAL HIGH (ref 70–99)
POTASSIUM: 4.9 meq/L (ref 3.7–5.3)
SODIUM: 138 meq/L (ref 137–147)

## 2013-03-28 LAB — CBC WITH DIFFERENTIAL/PLATELET
BASOS ABS: 0 10*3/uL (ref 0.0–0.1)
BASOS PCT: 0 % (ref 0–1)
Eosinophils Absolute: 0.3 10*3/uL (ref 0.0–0.7)
Eosinophils Relative: 4 % (ref 0–5)
HCT: 31 % — ABNORMAL LOW (ref 39.0–52.0)
Hemoglobin: 10 g/dL — ABNORMAL LOW (ref 13.0–17.0)
Lymphocytes Relative: 23 % (ref 12–46)
Lymphs Abs: 1.6 10*3/uL (ref 0.7–4.0)
MCH: 31.1 pg (ref 26.0–34.0)
MCHC: 32.3 g/dL (ref 30.0–36.0)
MCV: 96.3 fL (ref 78.0–100.0)
Monocytes Absolute: 0.5 10*3/uL (ref 0.1–1.0)
Monocytes Relative: 8 % (ref 3–12)
NEUTROS ABS: 4.7 10*3/uL (ref 1.7–7.7)
Neutrophils Relative %: 65 % (ref 43–77)
PLATELETS: 228 10*3/uL (ref 150–400)
RBC: 3.22 MIL/uL — ABNORMAL LOW (ref 4.22–5.81)
RDW: 15.2 % (ref 11.5–15.5)
WBC: 7.2 10*3/uL (ref 4.0–10.5)

## 2013-03-28 LAB — APTT: APTT: 34 s (ref 24–37)

## 2013-03-28 LAB — PROTIME-INR
INR: 2.22 — AB (ref 0.00–1.49)
PROTHROMBIN TIME: 23.9 s — AB (ref 11.6–15.2)

## 2013-03-28 LAB — ETHANOL

## 2013-03-28 MED ORDER — ONDANSETRON HCL 4 MG/2ML IJ SOLN
4.0000 mg | Freq: Once | INTRAMUSCULAR | Status: AC
  Administered 2013-03-28: 4 mg via INTRAVENOUS
  Filled 2013-03-28: qty 2

## 2013-03-28 MED ORDER — SODIUM CHLORIDE 0.9 % IV BOLUS (SEPSIS)
500.0000 mL | Freq: Once | INTRAVENOUS | Status: AC
  Administered 2013-03-28: 500 mL via INTRAVENOUS

## 2013-03-28 NOTE — ED Notes (Signed)
Pt ambulated well in hallway.  Complained of right hip pain from previous fall 6/10.

## 2013-03-28 NOTE — Progress Notes (Signed)
Chaplain responded to level two trauma. Unit secretary asked chaplain to give update to family who was in waiting room. Chaplain met family and relayed message. Will follow up as necessary.

## 2013-03-28 NOTE — Discharge Instructions (Signed)
1. Medications: usual home medications 2. Treatment: rest, drink plenty of fluids,  3. Follow Up: Please followup with your primary doctor for discussion of your diagnoses and further evaluation after today's visit;    Abrasions An abrasion is a cut or scrape of the skin. Abrasions do not go through all layers of the skin. HOME CARE  If a bandage (dressing) was put on your wound, change it as told by your doctor. If the bandage sticks, soak it off with warm.  Wash the area with water and soap 2 times a day. Rinse off the soap. Pat the area dry with a clean towel.  Put on medicated cream (ointment) as told by your doctor.  Change your bandage right away if it gets wet or dirty.  Only take medicine as told by your doctor.  See your doctor within 24 48 hours to get your wound checked.  Check your wound for redness, puffiness (swelling), or yellowish-white fluid (pus). GET HELP RIGHT AWAY IF:   You have more pain in the wound.  You have redness, swelling, or tenderness around the wound.  You have pus coming from the wound.  You have a fever or lasting symptoms for more than 2 3 days.  You have a fever and your symptoms suddenly get worse.  You have a bad smell coming from the wound or bandage. MAKE SURE YOU:   Understand these instructions.  Will watch your condition.  Will get help right away if you are not doing well or get worse. Document Released: 06/19/2007 Document Revised: 09/25/2011 Document Reviewed: 12/04/2010 [facility] Patient Information 2014 [place], [place].

## 2013-03-28 NOTE — ED Provider Notes (Signed)
CSN: [id number]     Arrival date & time 03/28/13  1559 History   First MD Initiated Contact with Patient 03/28/13 1603     Chief Complaint  Patient presents with  . Fall  . Trauma     (Consider location/radiation/quality/duration/timing/severity/associated sxs/prior Treatment) The history is provided by the patient and medical records. No language interpreter was used.    Ricardo Allen is a 80 y.o. male  with a hx of anemia, A. fib, spinal stenosis, orthostatic hypotension or low back pain presents to the Emergency Department via EMS after witnessed syncopal episode at home, hitting his head on the door jam of a car.  Patient is taking Coumadin for A. fib and reports that he has taken his medications this morning as usual. EMS reports repetitive questioning throughout transport.  They also report large hematoma to the left side of patient's head and abrasion under his chin.  History obtained from EMS. Patient initially confused and unable to answer questions. Level V caveat for altered mental status. Patient is a level II trauma.  Past Medical History  Diagnosis Date  . ANEMIA NOS 08/19/2006  . Atrial fibrillation 11/27/2009  . DEPRESSION 08/27/2006  . DISORDER, LUMBAR DISC W/MYELOPATHY 08/19/2006  . GOUT 03/22/2009  . HYPERLIPIDEMIA 08/27/2006  . HYPOTENSION, ORTHOSTATIC 10/31/2009  . Impaired fasting glucose 01/27/2007  . LOW BACK PAIN 02/03/2007  . LUMBAR RADICULOPATHY, LEFT 10/09/2006  . LUNG NODULE 03/29/2008  . MELANOMA, MALIGNANT, TRUNK 08/19/2006    early stage; just exicion.   . OBSTRUCTIVE SLEEP APNEA 08/19/2006  . TRAUMATIC ARTHROPATHY PELVIC REGION AND THIGH 06/29/2009  . TOBACCO ABUSE 12/31/2007  . Wet senile macular degeneration   . Diverticulosis   . Anemia of other chronic disease   . 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 Cataract extraction    . Lumbar laminectomy    . Lumbar fusion    . Tonsilectomy,  adenoidectomy, bilateral myringotomy and tubes    . Melanoma excision      left lower back  . Esophagogastroduodenoscopy N/A 06/20/2012    Procedure: ESOPHAGOGASTRODUODENOSCOPY (EGD);  Surgeon: Inda Castle, MD;  Location: [facility];  Service: Endoscopy;  Laterality: N/A;  . Colonoscopy N/A 06/21/2012    Procedure: COLONOSCOPY;  Surgeon: Inda Castle, MD;  Location: [facility];  Service: Endoscopy;  Laterality: N/A;   Family History  Problem Relation Age of Onset  . Pancreatic cancer Mother   . Cancer Mother     pancreas  . Testicular cancer Father   . Cancer Father     testicular  . Arthritis Brother   . Cancer Maternal Aunt     head/neck cancer   History  Substance Use Topics  . Smoking status: Former Smoker -- 2.00 packs/day for 60 years    Types: Cigarettes    Quit date: 04/14/2008  . Smokeless tobacco: Not on file  . Alcohol Use: 4.4 oz/week    4 Glasses of wine, 4 Drinks containing 0.5 oz of alcohol per week    Review of Systems  Unable to perform ROS: Mental status change      Allergies  Fentanyl; Hydromorphone hcl; and Sulfonamide derivatives  Home Medications   Current Outpatient Rx  Name  Route  Sig  Dispense  Refill  . alendronate (FOSAMAX) 70 MG tablet   Oral   Take 70 mg by mouth every Sunday. Take with a full glass  of water on an empty stomach.         Marland Kitchen allopurinol (ZYLOPRIM) 100 MG tablet   Oral   Take 1 tablet (100 mg total) by mouth daily.   30 tablet   5   . amiodarone (PACERONE) 200 MG tablet   Oral   Take 1 tablet (200 mg total) by mouth daily.   30 tablet   5   . citalopram (CELEXA) 20 MG tablet   Oral   Take 1 tablet (20 mg total) by mouth daily.   30 tablet   5   . Cobalamine Combinations (B-6 FOLIC ACID) 123456 MCG-MCG-MG CAPS   Oral   Take 1 capsule by mouth daily.   30 each   11   . colchicine 0.6 MG tablet   Oral   Take 0.6 mg by mouth daily as needed (for gout).         Marland Kitchen docusate sodium 100 MG  CAPS   Oral   Take 100 mg by mouth 2 (two) times daily.   60 capsule   5   . ferrous sulfate (FERROUSUL) 325 (65 FE) MG tablet   Oral   Take 1 tablet (325 mg total) by mouth daily with breakfast.   30 tablet   5   . HYDROcodone-acetaminophen (NORCO) 10-325 MG per tablet   Oral   Take 1 tablet by mouth every 6 (six) hours as needed for pain.         . metoprolol tartrate (LOPRESSOR) 25 MG tablet   Oral   Take 1 tablet (25 mg total) by mouth 2 (two) times daily.   60 tablet   5   . morphine (MS CONTIN) 30 MG 12 hr tablet   Oral   Take 30 mg by mouth 2 (two) times daily.           . pantoprazole (PROTONIX) 40 MG tablet   Oral   Take 1 tablet (40 mg total) by mouth daily at 6 (six) AM.   30 tablet   5   . polyethylene glycol (MIRALAX / GLYCOLAX) packet   Oral   Take 17 g by mouth daily as needed (constipation).         . Probiotic Product (PROBIOTIC DAILY PO)   Oral   Take by mouth.         . warfarin (COUMADIN) 2 MG tablet   Oral   Take 2 tablets by mouth daily.          BP 113/62  Pulse 62  Temp(Src) 98.2 F (36.8 C) (Oral)  Resp 17  SpO2 92% Physical Exam  Nursing note and vitals reviewed. Constitutional: He appears well-developed and well-nourished. No distress.  Awake, alert, nontoxic appearance  HENT:  Head: Normocephalic. Head is with contusion.    Right Ear: Tympanic membrane, external ear and ear canal normal. No hemotympanum.  Left Ear: Tympanic membrane, external ear and ear canal normal. No hemotympanum.  Nose: Nose normal. No epistaxis. Right sinus exhibits no maxillary sinus tenderness and no frontal sinus tenderness. Left sinus exhibits no maxillary sinus tenderness and no frontal sinus tenderness.  Mouth/Throat: Uvula is midline, oropharynx is clear and moist and mucous membranes are normal. Mucous membranes are not pale and not cyanotic. No oropharyngeal exudate, posterior oropharyngeal edema, posterior oropharyngeal erythema or  tonsillar abscesses.  Hematoma noted behind the left ear Right chin contusion noted with abrasion No hemotympanum No battle signs No TMJ  Eyes: Conjunctivae and EOM are normal.  Pupils are equal, round, and reactive to light. No scleral icterus.  Neck: Normal range of motion and full passive range of motion without pain. Neck supple.  Full ROM without pain No midline or paraspinal tenderness  Cardiovascular: Normal rate, regular rhythm, normal heart sounds and intact distal pulses.   No murmur heard. Tachycardia  Pulmonary/Chest: Effort normal and breath sounds normal. No stridor. No respiratory distress. He has no wheezes. He has no rales.  Clear and equal breath sounds  Abdominal: Soft. Bowel sounds are normal. He exhibits no distension and no mass. There is no tenderness. There is no rebound and no guarding.  Abdomen soft and nontender  Musculoskeletal: Normal range of motion. He exhibits no edema.       Right hip: Normal.       Right knee: Normal.       Right ankle: Normal.       Legs: Full range of motion of the T-spine and L-spine No tenderness to palpation of the spinous processes of the T-spine or L-spine No tenderness to palpation of the paraspinous muscles of the L-spine Full range of motion in all joints of the right lower extremity. Abrasion to the right knee  Lymphadenopathy:    He has no cervical adenopathy.  Neurological: He is alert. He has normal reflexes. No cranial nerve deficit. He exhibits normal muscle tone. Coordination normal.  Patient able to orient to person, follows commands without difficulty but as repetitive questions and is unable to orient to place or time. Speech is clear and goal oriented Cranial nerves III - XII without deficit, no facial droop Normal strength in upper and lower extremities bilaterally, strong and equal grip strength Sensation normal to light and sharp touch Moves extremities without ataxia, coordination intact Normal finger to  nose and rapid alternating movements  Skin: Skin is warm and dry. No rash noted. He is not diaphoretic. No erythema.  Psychiatric: He has a normal mood and affect. His behavior is normal. Judgment and thought content normal.    ED Course  Procedures (including critical care time) Labs Review Labs Reviewed  CBC WITH DIFFERENTIAL  BASIC METABOLIC PANEL  APTT  PROTIME-INR   Imaging Review No results found.   EKG Interpretation None      MDM   Final diagnoses:  None   Ricardo Allen presents via EMS after syncopal episode, hitting his head on the side of a car.  He presents with confusion and large hematoma to the left side of his head.  Oriented x1 but otherwise normal neurologic exam.  Denies midline or paraspinal tenderness to the C-spine, T-spine or L-spine.  Full range of motion of all major joints without difficulty.  5:35PM Family now bedside.  Patient's daughter at bedside reports that he did not hit his head on the concrete. She thinks that he did not have a full syncopal episode. Wife at bedside reports that patient often becomes confused when he is worried or scared. She states that he seems normal to her right now.  Wife also reports the patient has had a glass of wine and 2 gin and tonics today. Wife also reports history of orthostatic hypotension.  Patient alert and oriented x4 at this time. He reports he remembers eating lunch, drinking his gin and the EMS ride to the hospital but does not remember the fall.   6:54 PM  Patient remains alert and oriented x4. He has no orthostatic hypotension at this time. He ambulates in the hall  without assistance and without gait disturbance. He has remained neurologically intact throughout his time here in the emergency department.  CT head and cervical spine without evidence of acute abnormality including subarachnoid or subdural hematoma or cervical fracture or subluxation.  I personally reviewed the imaging tests through PACS  system  I reviewed available ER/hospitalization records through the EMR  Patient with elevated BUN and creatinine. Wife reports that patient has stage III chronic kidney disease and this is normal. She also reports that he has a history of anemia and last checked was last week it was found to be 10.1. Hemoglobin 10.0 today.  PT/INR therapeutic.   7:38 PM  Patient given fluid bolus.  He reports he feels well and wishes to go home.  He continues to be alert and oriented without neurologic deficit.  Vital signs stable. We'll discharge home with family.  EtOH <11  It has been determined that no acute conditions requiring further emergency intervention are present at this time. The patient/guardian have been advised of the diagnosis and plan. We have discussed signs and symptoms that warrant return to the ED, such as changes or worsening in symptoms.   Vital signs are stable at discharge.   BP 158/73  Pulse 72  Temp(Src) 98.2 F (36.8 C) (Oral)  Resp 20  Ht 5\' 11"  (1.803 m)  Wt 174 lb (78.926 kg)  BMI 24.28 kg/m2  SpO2 99%  Patient/guardian has voiced understanding and agreed to follow-up with the PCP or specialist.        Abigail Butts, PA-C 03/28/13 2305

## 2013-03-28 NOTE — ED Provider Notes (Signed)
Medical screening examination/treatment/procedure(s) were conducted as a shared visit with non-physician practitioner(s) and myself.  I personally evaluated the patient during the encounter.   EKG Interpretation None      Pt with fall, has history of same, has had some EtOH tonight, CTs neg.   Tylan Briguglio B. Karle Starch, MD 03/28/13 6195

## 2013-03-28 NOTE — ED Notes (Signed)
Pt from home via [facility].  Pt fell in driveway following a syncopal episode, repeated questions with confusion.  Hematoma behind left ear, abrasions to knuckles.  Pt alert.

## 2013-03-29 ENCOUNTER — Emergency Department (HOSPITAL_COMMUNITY): Payer: Medicare Other

## 2013-03-29 ENCOUNTER — Emergency Department (HOSPITAL_COMMUNITY)
Admission: EM | Admit: 2013-03-29 | Discharge: 2013-03-29 | Disposition: A | Discharge status: Home / Self Care | Payer: Medicare Other | Source: Home / Self Care | Attending: Emergency Medicine | Admitting: Emergency Medicine

## 2013-03-29 ENCOUNTER — Encounter (HOSPITAL_COMMUNITY): Payer: Self-pay | Admitting: Emergency Medicine

## 2013-03-29 DIAGNOSIS — S7011XA Contusion of right thigh, initial encounter: Secondary | ICD-10-CM

## 2013-03-29 DIAGNOSIS — R296 Repeated falls: Secondary | ICD-10-CM | POA: Insufficient documentation

## 2013-03-29 DIAGNOSIS — Z85828 Personal history of other malignant neoplasm of skin: Secondary | ICD-10-CM

## 2013-03-29 DIAGNOSIS — S7010XA Contusion of unspecified thigh, initial encounter: Secondary | ICD-10-CM

## 2013-03-29 DIAGNOSIS — Z87891 Personal history of nicotine dependence: Secondary | ICD-10-CM | POA: Insufficient documentation

## 2013-03-29 DIAGNOSIS — Y929 Unspecified place or not applicable: Secondary | ICD-10-CM | POA: Insufficient documentation

## 2013-03-29 DIAGNOSIS — E785 Hyperlipidemia, unspecified: Secondary | ICD-10-CM

## 2013-03-29 DIAGNOSIS — Z8669 Personal history of other diseases of the nervous system and sense organs: Secondary | ICD-10-CM | POA: Insufficient documentation

## 2013-03-29 DIAGNOSIS — F3289 Other specified depressive episodes: Secondary | ICD-10-CM | POA: Insufficient documentation

## 2013-03-29 DIAGNOSIS — Z8739 Personal history of other diseases of the musculoskeletal system and connective tissue: Secondary | ICD-10-CM

## 2013-03-29 DIAGNOSIS — M109 Gout, unspecified: Secondary | ICD-10-CM

## 2013-03-29 DIAGNOSIS — Z7901 Long term (current) use of anticoagulants: Secondary | ICD-10-CM | POA: Insufficient documentation

## 2013-03-29 DIAGNOSIS — D638 Anemia in other chronic diseases classified elsewhere: Secondary | ICD-10-CM

## 2013-03-29 DIAGNOSIS — Y9389 Activity, other specified: Secondary | ICD-10-CM

## 2013-03-29 DIAGNOSIS — Z8719 Personal history of other diseases of the digestive system: Secondary | ICD-10-CM

## 2013-03-29 DIAGNOSIS — F329 Major depressive disorder, single episode, unspecified: Secondary | ICD-10-CM | POA: Insufficient documentation

## 2013-03-29 DIAGNOSIS — Z79899 Other long term (current) drug therapy: Secondary | ICD-10-CM | POA: Insufficient documentation

## 2013-03-29 DIAGNOSIS — I4891 Unspecified atrial fibrillation: Secondary | ICD-10-CM

## 2013-03-29 LAB — CBC WITH DIFFERENTIAL/PLATELET
BASOS ABS: 0 10*3/uL (ref 0.0–0.1)
BASOS PCT: 0 % (ref 0–1)
Eosinophils Absolute: 0.1 10*3/uL (ref 0.0–0.7)
Eosinophils Relative: 1 % (ref 0–5)
HCT: 27.3 % — ABNORMAL LOW (ref 39.0–52.0)
Hemoglobin: 8.9 g/dL — ABNORMAL LOW (ref 13.0–17.0)
Lymphocytes Relative: 11 % — ABNORMAL LOW (ref 12–46)
Lymphs Abs: 1.2 10*3/uL (ref 0.7–4.0)
MCH: 30.9 pg (ref 26.0–34.0)
MCHC: 32.6 g/dL (ref 30.0–36.0)
MCV: 94.8 fL (ref 78.0–100.0)
MONO ABS: 1 10*3/uL (ref 0.1–1.0)
Monocytes Relative: 9 % (ref 3–12)
NEUTROS ABS: 8.2 10*3/uL — AB (ref 1.7–7.7)
Neutrophils Relative %: 79 % — ABNORMAL HIGH (ref 43–77)
Platelets: 219 10*3/uL (ref 150–400)
RBC: 2.88 MIL/uL — ABNORMAL LOW (ref 4.22–5.81)
RDW: 15 % (ref 11.5–15.5)
WBC: 10.4 10*3/uL (ref 4.0–10.5)

## 2013-03-29 LAB — COMPREHENSIVE METABOLIC PANEL
ALBUMIN: 3.2 g/dL — AB (ref 3.5–5.2)
ALT: 11 U/L (ref 0–53)
AST: 23 U/L (ref 0–37)
Alkaline Phosphatase: 74 U/L (ref 39–117)
BUN: 29 mg/dL — ABNORMAL HIGH (ref 6–23)
CO2: 25 mEq/L (ref 19–32)
CREATININE: 1.74 mg/dL — AB (ref 0.50–1.35)
Calcium: 8.3 mg/dL — ABNORMAL LOW (ref 8.4–10.5)
Chloride: 103 mEq/L (ref 96–112)
GFR calc Af Amer: 39 mL/min — ABNORMAL LOW (ref >90)
GFR calc non Af Amer: 34 mL/min — ABNORMAL LOW (ref >90)
Glucose, Bld: 143 mg/dL — ABNORMAL HIGH (ref 70–99)
Potassium: 5.3 mEq/L (ref 3.7–5.3)
Sodium: 139 mEq/L (ref 137–147)
Total Bilirubin: 0.2 mg/dL — ABNORMAL LOW (ref 0.3–1.2)
Total Protein: 6.1 g/dL (ref 6.0–8.3)

## 2013-03-29 LAB — PROTIME-INR
INR: 2.33 — AB (ref 0.00–1.49)
Prothrombin Time: 24.8 seconds — ABNORMAL HIGH (ref 11.6–15.2)

## 2013-03-29 MED ORDER — OXYCODONE-ACETAMINOPHEN 5-325 MG PO TABS
2.0000 | ORAL_TABLET | Freq: Once | ORAL | Status: AC
  Administered 2013-03-29: 2 via ORAL
  Filled 2013-03-29: qty 2

## 2013-03-29 MED ORDER — OXYCODONE-ACETAMINOPHEN 5-325 MG PO TABS: 1.0000 | ORAL_TABLET | ORAL | Status: DC | PRN

## 2013-03-29 NOTE — ED Notes (Addendum)
Patient was seen here in ED yesterday afternoon post-syncopal episode resulting in a fall. Patient had MSE completed at that time. After leaving yesterday patient noticed his right leg began to hurt and was swollen compared to his left. Patient's right leg was not evaluated per AVS from previous visit. Patient has had a hip replacement done on the right. Patient took a hydrocodone tablet and a ER Morphine tablet approximately 1hr ago.

## 2013-03-29 NOTE — ED Notes (Signed)
Returned from xray

## 2013-03-29 NOTE — ED Notes (Signed)
Pt required moderate help when transferring from bed to wheel chair, pt states that he uses a walker and cane at home , and will have help when he returns home with helping him ambulate

## 2013-03-29 NOTE — ED Provider Notes (Addendum)
CSN: [id number]     Arrival date & time 03/29/13  0159 History   First MD Initiated Contact with Patient 03/29/13 0249     Chief Complaint  Patient presents with  . Leg Swelling     (Consider location/radiation/quality/duration/timing/severity/associated sxs/prior Treatment) HPI Patient is 80 year old gentleman who is on Coumadin for atrial fibrillation. He was seen yesterday evening for fall and possible syncope. He was noted to have contusions to his head and neck. He had a CT of his head which showed no acute bleed. He also had a CT of his neck. Patient was deemed to be stable and a new living in emergency department. He was discharged home. Patient states that since discharge she has had gradually increasing right lateral thigh pain and tightness. He states the pain has become more than he is able to bear. He's had difficulty ambulating due to pain. He denies any focal weakness or numbness. Past Medical History  Diagnosis Date  . ANEMIA NOS 08/19/2006  . Atrial fibrillation 11/27/2009  . DEPRESSION 08/27/2006  . DISORDER, LUMBAR DISC W/MYELOPATHY 08/19/2006  . GOUT 03/22/2009  . HYPERLIPIDEMIA 08/27/2006  . HYPOTENSION, ORTHOSTATIC 10/31/2009  . Impaired fasting glucose 01/27/2007  . LOW BACK PAIN 02/03/2007  . LUMBAR RADICULOPATHY, LEFT 10/09/2006  . LUNG NODULE 03/29/2008  . MELANOMA, MALIGNANT, TRUNK 08/19/2006    early stage; just exicion.   . OBSTRUCTIVE SLEEP APNEA 08/19/2006  . TRAUMATIC ARTHROPATHY PELVIC REGION AND THIGH 06/29/2009  . TOBACCO ABUSE 12/31/2007  . Wet senile macular degeneration   . Diverticulosis   . Anemia of other chronic disease   . 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 Cataract extraction    . Lumbar laminectomy    . Lumbar fusion    . Tonsilectomy, adenoidectomy, bilateral myringotomy and tubes    . Melanoma excision      left lower back  . Esophagogastroduodenoscopy N/A 06/20/2012    Procedure:  ESOPHAGOGASTRODUODENOSCOPY (EGD);  Surgeon: Inda Castle, MD;  Location: [facility];  Service: Endoscopy;  Laterality: N/A;  . Colonoscopy N/A 06/21/2012    Procedure: COLONOSCOPY;  Surgeon: Inda Castle, MD;  Location: [facility];  Service: Endoscopy;  Laterality: N/A;   Family History  Problem Relation Age of Onset  . Pancreatic cancer Mother   . Cancer Mother     pancreas  . Testicular cancer Father   . Cancer Father     testicular  . Arthritis Brother   . Cancer Maternal Aunt     head/neck cancer   History  Substance Use Topics  . Smoking status: Former Smoker -- 2.00 packs/day for 60 years    Types: Cigarettes    Quit date: 04/14/2008  . Smokeless tobacco: Not on file  . Alcohol Use: 4.4 oz/week    4 Glasses of wine, 4 Drinks containing 0.5 oz of alcohol per week    Review of Systems  Constitutional: Negative for fever and chills.  Respiratory: Negative for shortness of breath.   Cardiovascular: Negative for chest pain.  Gastrointestinal: Negative for nausea, vomiting and abdominal pain.  Musculoskeletal: Positive for myalgias. Negative for back pain, neck pain and neck stiffness.  Skin: Positive for wound.  Neurological: Negative for dizziness, weakness, light-headedness, numbness and headaches.  All other systems reviewed and are negative.      Allergies  Fentanyl; Hydromorphone hcl; and Sulfonamide derivatives  Home Medications   Current Outpatient Rx  Name  Route  Sig  Dispense  Refill  . alendronate (FOSAMAX) 70 MG tablet   Oral   Take 70 mg by mouth every Sunday. Take with a full glass of water on an empty stomach.         Marland Kitchen allopurinol (ZYLOPRIM) 100 MG tablet   Oral   Take 1 tablet (100 mg total) by mouth daily.   30 tablet   5   . amiodarone (PACERONE) 200 MG tablet   Oral   Take 1 tablet (200 mg total) by mouth daily.   30 tablet   5   . citalopram (CELEXA) 20 MG tablet   Oral   Take 1 tablet (20 mg total) by mouth daily.   30  tablet   5   . Cobalamine Combinations (B-6 FOLIC ACID) 720-9470-96 MCG-MCG-MG CAPS   Oral   Take 1 capsule by mouth daily.   30 each   11   . colchicine 0.6 MG tablet   Oral   Take 0.6 mg by mouth daily as needed (for gout).         Marland Kitchen docusate sodium 100 MG CAPS   Oral   Take 100 mg by mouth 2 (two) times daily.   60 capsule   5   . ferrous sulfate (FERROUSUL) 325 (65 FE) MG tablet   Oral   Take 1 tablet (325 mg total) by mouth daily with breakfast.   30 tablet   5   . HYDROcodone-acetaminophen (NORCO) 10-325 MG per tablet   Oral   Take 1 tablet by mouth every 6 (six) hours as needed for pain.         . metoprolol tartrate (LOPRESSOR) 25 MG tablet   Oral   Take 1 tablet (25 mg total) by mouth 2 (two) times daily.   60 tablet   5   . morphine (MS CONTIN) 30 MG 12 hr tablet   Oral   Take 30 mg by mouth 2 (two) times daily.           . pantoprazole (PROTONIX) 40 MG tablet   Oral   Take 1 tablet (40 mg total) by mouth daily at 6 (six) AM.   30 tablet   5   . Probiotic Product (PROBIOTIC DAILY PO)   Oral   Take by mouth.         . warfarin (COUMADIN) 2 MG tablet   Oral   Take 2 tablets by mouth daily.         . polyethylene glycol (MIRALAX / GLYCOLAX) packet   Oral   Take 17 g by mouth daily as needed (constipation).          BP 116/55  Pulse 75  Temp(Src) 98.4 F (36.9 C) (Oral)  Resp 20  Ht 5\' 11"  (1.803 m)  Wt 173 lb (78.472 kg)  BMI 24.14 kg/m2  SpO2 98% Physical Exam  Nursing note and vitals reviewed. Constitutional: He is oriented to person, place, and time. He appears well-developed and well-nourished. No distress.  HENT:  Head: Normocephalic.  Mouth/Throat: Oropharynx is clear and moist.  Patient has a right anterior neck contusion as well as a left occipital contusion  Eyes: EOM are normal. Pupils are equal, round, and reactive to light.  Neck: Normal range of motion. Neck supple.  No midline posterior cervical tenderness.   Cardiovascular: Normal rate and regular rhythm.   Pulmonary/Chest: Effort normal and breath sounds normal. No respiratory distress. He has no wheezes.  He has no rales. He exhibits no tenderness.  Abdominal: Soft. Bowel sounds are normal. He exhibits no distension and no mass. There is no tenderness. There is no rebound and no guarding.  Musculoskeletal: Normal range of motion. He exhibits tenderness. He exhibits no edema.  Patient has right lateral thigh tightness and tenderness extending from the knee although it up to the hip. He has full range of motion of both the knee and the hip. His bilateral calves are soft. He has 2+ dorsalis pedis pulses.  Neurological: He is alert and oriented to person, place, and time.  Moves all extremities without focal deficit. Sensation is grossly intact.  Skin: Skin is warm and dry. No rash noted. No erythema.  Psychiatric: He has a normal mood and affect. His behavior is normal.    ED Course  Procedures (including critical care time) Labs Review Labs Reviewed  CBC WITH DIFFERENTIAL - Abnormal; Notable for the following:    RBC 2.88 (*)    Hemoglobin 8.9 (*)    HCT 27.3 (*)    Neutrophils Relative % 79 (*)    Neutro Abs 8.2 (*)    Lymphocytes Relative 11 (*)    All other components within normal limits  COMPREHENSIVE METABOLIC PANEL  PROTIME-INR   Imaging Review Ct Cervical Spine Wo Contrast  03/28/2013   CLINICAL DATA:  Syncope with pain and trauma ; confusion  EXAM: CT HEAD WITHOUT CONTRAST  CT CERVICAL SPINE WITHOUT CONTRAST  TECHNIQUE: Multidetector CT imaging of the head and cervical spine was performed following the standard protocol without intravenous contrast. Multiplanar CT image reconstructions of the cervical spine were also generated.  COMPARISON:  Head CT October 20, 2009; cervical spine CT September 16, 2009  FINDINGS: CT HEAD FINDINGS  Moderate diffuse atrophy is stable. There is a stable arachnoid cyst at the level of the left sylvian  fissure marriage tearing 3.2 x 3.4 x 2.6 cm. There is no other mass. There is no hemorrhage, extra-axial fluid, or midline shift. There is small vessel disease throughout the centra semiovale bilaterally. There is no demonstrable acute infarct. Bony calvarium appears intact. The mastoid air cells clear.  CT CERVICAL SPINE FINDINGS  There is no apparent fracture. There is mild anterolisthesis of C3 on C4, stable. There is mild anterolisthesis of C4 on C5, stable. There is mild anterolisthesis C5 on C6, stable. There is mild anterolisthesis of C6 on C7, stable. There is mild anterolisthesis of C7 on T1, stable. These areas of spondylolisthesis are felt to be due to underlying spondylosis and are stable. Prevertebral soft tissues and predental space regions are normal.  There is osteoarthritic change to varying degrees at all levels. No disc extrusion or stenosis. Bones are somewhat osteoporotic. There is scarring in both lung apices. Thyroid appears unremarkable.  IMPRESSION: CT head: Atrophy with periventricular small vessel disease. Stable arachnoid cyst in the left sylvian fissure region. No acute appearing infarct. No hemorrhage or extra-axial fluid.  CT cervical spine: Extensive spondylosis. Multilevel 1 spondylolisthesis is stable and felt to be due to underlying spondylosis. No fracture.   Electronically Signed   By: Lowella Grip M.D.   On: 03/28/2013 16:57   Dg Chest Port 1 View  03/28/2013   CLINICAL DATA:  Syncope  EXAM: PORTABLE CHEST - 1 VIEW  COMPARISON:  July 24, 2009  FINDINGS: There are calcified pleural plaques, stable, consistent with previous asbestos exposure. There is no edema or consolidation. Heart is borderline prominent with normal pulmonary vascularity. No  adenopathy. There is atherosclerotic change in the aorta. There is degenerative change in the thoracic spine.  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 No edema or consolidation.   Electronically  Signed   By: Lowella Grip M.D.   On: 03/28/2013 16:48     EKG Interpretation None      MDM   Final diagnoses:  None    Patient with right leg pain after fall. He is on Coumadin and I suspect they underlying hematoma as the cause for his symptoms. We'll recheck hemoglobin and INR. Will get an x-ray to rule out any occult fractures.  No fractures present on chest x-ray. Patient remains hemodynamically stable. He continues to have pain in the right leg. Reexam shows that he does have tight compartments in the lateral thigh but is essentially unchanged from presentation.. He has good distal pulses. There is no evidence of compartment syndrome. He is requesting to be discharged home. I advised patient to hold his Coumadin for several days. He is to keep ice on the leg and keep it elevated. If the pain worsens or experiences numbness or increase swelling the patient has been instructed to return immediately to the emergency department. He also is to followup with his primary Dr. in 2-3 days for reevaluation and possible restarting his Coumadin. Both patient and son have expressed understanding and agreement with plan.  Julianne Rice, MD 03/29/13 [phone]  Patient with some difficulty transferring due to pain. I offered admission for observation the patient is insistent on being discharged home. He appears competent to make these decisions and son states he will keep a close eye on him. I reiterated return precautions and patient has voiced understanding.  Julianne Rice, MD 03/29/13 0530

## 2013-03-29 NOTE — Discharge Instructions (Signed)
Stop taking your Coumadin until you are reevaluated by your primary doctor in 2 days. Keep your leg elevated with application of ice. You may take the pain medication as prescribed. Return immediately to the emergency department for worsening pain, increased swelling, numbness or tingling to the foot or for any concerns.  Hematoma A hematoma is a collection of blood under the skin, in an organ, in a body space, in a joint space, or in other tissue. The blood can clot to form a lump that you can see and feel. The lump is often firm and may sometimes become sore and tender. Most hematomas get better in a few days to weeks. However, some hematomas may be serious and require medical care. Hematomas can range in size from very small to very large. CAUSES  A hematoma can be caused by a blunt or penetrating injury. It can also be caused by spontaneous leakage from a blood vessel under the skin. Spontaneous leakage from a blood vessel is more likely to occur in older people, especially those taking blood thinners. Sometimes, a hematoma can develop after certain medical procedures. SIGNS AND SYMPTOMS   A firm lump on the body.  Possible pain and tenderness in the area.  Bruising.Blue, dark blue, purple-red, or yellowish skin may appear at the site of the hematoma if the hematoma is close to the surface of the skin. For hematomas in deeper tissues or body spaces, the signs and symptoms may be subtle. For example, an intra-abdominal hematoma may cause abdominal pain, weakness, fainting, and shortness of breath. An intracranial hematoma may cause a headache or symptoms such as weakness, trouble speaking, or a change in consciousness. DIAGNOSIS  A hematoma can usually be diagnosed based on your medical history and a physical exam. Imaging tests may be needed if your health care provider suspects a hematoma in deeper tissues or body spaces, such as the abdomen, head, or chest. These tests may include  ultrasonography or a CT scan.  TREATMENT  Hematomas usually go away on their own over time. Rarely does the blood need to be drained out of the body. Large hematomas or those that may affect vital organs will sometimes need surgical drainage or monitoring. HOME CARE INSTRUCTIONS   Apply ice to the injured area:   Put ice in a plastic bag.   Place a towel between your skin and the bag.   Leave the ice on for 20 minutes, 2 3 times a day for the first 1 to 2 days.   After the first 2 days, switch to using warm compresses on the hematoma.   Elevate the injured area to help decrease pain and swelling. Wrapping the area with an elastic bandage may also be helpful. Compression helps to reduce swelling and promotes shrinking of the hematoma. Make sure the bandage is not wrapped too tight.   If your hematoma is on a lower extremity and is painful, crutches may be helpful for a couple days.   Only take over-the-counter or prescription medicines as directed by your health care provider. SEEK IMMEDIATE MEDICAL CARE IF:   You have increasing pain, or your pain is not controlled with medicine.   You have a fever.   You have worsening swelling or discoloration.   Your skin over the hematoma breaks or starts bleeding.   Your hematoma is in your chest or abdomen and you have weakness, shortness of breath, or a change in consciousness.  Your hematoma is on your scalp (caused by  a fall or injury) and you have a worsening headache or a change in alertness or consciousness. MAKE SURE YOU:   Understand these instructions.  Will watch your condition.  Will get help right away if you are not doing well or get worse. Document Released: 08/15/2003 Document Revised: 09/02/2012 Document Reviewed: 06/10/2012 [facility] Patient Information 2014 [place].

## 2013-03-30 ENCOUNTER — Inpatient Hospital Stay (HOSPITAL_COMMUNITY): Payer: Medicare Other

## 2013-03-30 ENCOUNTER — Encounter (HOSPITAL_COMMUNITY): Payer: Self-pay

## 2013-03-30 ENCOUNTER — Inpatient Hospital Stay (HOSPITAL_COMMUNITY)
Admission: AD | Admit: 2013-03-30 | Discharge: 2013-04-06 | DRG: 811 | Disposition: A | Discharge status: Skilled Nursing Facility | Payer: Medicare Other | Source: Ambulatory Visit | Attending: Internal Medicine | Admitting: Internal Medicine

## 2013-03-30 DIAGNOSIS — G9608 Other cranial cerebrospinal fluid leak: Secondary | ICD-10-CM

## 2013-03-30 DIAGNOSIS — Z7901 Long term (current) use of anticoagulants: Secondary | ICD-10-CM

## 2013-03-30 DIAGNOSIS — D649 Anemia, unspecified: Secondary | ICD-10-CM | POA: Diagnosis present

## 2013-03-30 DIAGNOSIS — M12559 Traumatic arthropathy, unspecified hip: Secondary | ICD-10-CM

## 2013-03-30 DIAGNOSIS — G960 Cerebrospinal fluid leak: Secondary | ICD-10-CM

## 2013-03-30 DIAGNOSIS — I629 Nontraumatic intracranial hemorrhage, unspecified: Secondary | ICD-10-CM

## 2013-03-30 LAB — IRON AND TIBC
Iron: 15 ug/dL — ABNORMAL LOW (ref 42–135)
SATURATION RATIOS: 8 % — AB (ref 20–55)
TIBC: 200 ug/dL — ABNORMAL LOW (ref 215–435)
UIBC: 185 ug/dL (ref 125–400)

## 2013-03-30 LAB — CBC WITH DIFFERENTIAL/PLATELET
BASOS PCT: 0 % (ref 0–1)
Basophils Absolute: 0 10*3/uL (ref 0.0–0.1)
EOS ABS: 0.2 10*3/uL (ref 0.0–0.7)
EOS PCT: 1 % (ref 0–5)
HEMATOCRIT: 21.3 % — AB (ref 39.0–52.0)
HEMOGLOBIN: 7 g/dL — AB (ref 13.0–17.0)
LYMPHS ABS: 2.1 10*3/uL (ref 0.7–4.0)
Lymphocytes Relative: 15 % (ref 12–46)
MCH: 31.3 pg (ref 26.0–34.0)
MCHC: 32.9 g/dL (ref 30.0–36.0)
MCV: 95.1 fL (ref 78.0–100.0)
MONO ABS: 1.3 10*3/uL — AB (ref 0.1–1.0)
Monocytes Relative: 9 % (ref 3–12)
NEUTROS PCT: 75 % (ref 43–77)
Neutro Abs: 10.6 10*3/uL — ABNORMAL HIGH (ref 1.7–7.7)
Platelets: 204 10*3/uL (ref 150–400)
RBC: 2.24 MIL/uL — ABNORMAL LOW (ref 4.22–5.81)
RDW: 15.4 % (ref 11.5–15.5)
WBC: 14.2 10*3/uL — ABNORMAL HIGH (ref 4.0–10.5)

## 2013-03-30 LAB — PROTIME-INR
INR: 2.12 — ABNORMAL HIGH (ref 0.00–1.49)
Prothrombin Time: 23.1 seconds — ABNORMAL HIGH (ref 11.6–15.2)

## 2013-03-30 LAB — LACTATE DEHYDROGENASE: LDH: 185 U/L (ref 94–250)

## 2013-03-30 LAB — BASIC METABOLIC PANEL
BUN: 38 mg/dL — AB (ref 6–23)
CALCIUM: 8.5 mg/dL (ref 8.4–10.5)
CHLORIDE: 98 meq/L (ref 96–112)
CO2: 24 meq/L (ref 19–32)
CREATININE: 2.19 mg/dL — AB (ref 0.50–1.35)
GFR calc Af Amer: 30 mL/min — ABNORMAL LOW (ref >90)
GFR calc non Af Amer: 26 mL/min — ABNORMAL LOW (ref >90)
GLUCOSE: 119 mg/dL — AB (ref 70–99)
Potassium: 5.1 mEq/L (ref 3.7–5.3)
Sodium: 135 mEq/L — ABNORMAL LOW (ref 137–147)

## 2013-03-30 LAB — HAPTOGLOBIN: HAPTOGLOBIN: 113 mg/dL (ref 45–215)

## 2013-03-30 LAB — PREPARE RBC (CROSSMATCH)

## 2013-03-30 MED ORDER — PANTOPRAZOLE SODIUM 40 MG PO TBEC
40.0000 mg | DELAYED_RELEASE_TABLET | Freq: Every day | ORAL | Status: DC
  Administered 2013-03-31 – 2013-04-06 (×7): 40 mg via ORAL
  Filled 2013-03-30 (×7): qty 1

## 2013-03-30 MED ORDER — MORPHINE SULFATE CR 30 MG PO TB12: 30.0000 mg | ORAL_TABLET | Freq: Two times a day (BID) | ORAL | Status: DC

## 2013-03-30 MED ORDER — ALLOPURINOL 100 MG PO TABS
100.0000 mg | ORAL_TABLET | Freq: Every day | ORAL | Status: DC
  Administered 2013-03-31 – 2013-04-06 (×7): 100 mg via ORAL
  Filled 2013-03-30 (×8): qty 1

## 2013-03-30 MED ORDER — IOHEXOL 300 MG/ML  SOLN
25.0000 mL | INTRAMUSCULAR | Status: AC
  Administered 2013-03-30 (×2): 25 mL via ORAL

## 2013-03-30 MED ORDER — SODIUM CHLORIDE 0.9 % IV SOLN
INTRAVENOUS | Status: DC
  Administered 2013-03-30 – 2013-03-31 (×3): via INTRAVENOUS

## 2013-03-30 MED ORDER — MORPHINE SULFATE ER 15 MG PO TBCR
30.0000 mg | EXTENDED_RELEASE_TABLET | Freq: Two times a day (BID) | ORAL | Status: DC
  Administered 2013-03-30 – 2013-04-01 (×4): 30 mg via ORAL
  Filled 2013-03-30 (×4): qty 2

## 2013-03-30 MED ORDER — CITALOPRAM HYDROBROMIDE 20 MG PO TABS
20.0000 mg | ORAL_TABLET | Freq: Every day | ORAL | Status: DC
  Administered 2013-03-31 – 2013-04-06 (×7): 20 mg via ORAL
  Filled 2013-03-30 (×8): qty 1

## 2013-03-30 MED ORDER — IOHEXOL 300 MG/ML  SOLN
80.0000 mL | Freq: Once | INTRAMUSCULAR | Status: AC | PRN
  Administered 2013-03-30: 80 mL via INTRAVENOUS

## 2013-03-30 MED ORDER — SODIUM CHLORIDE 0.9 % IJ SOLN
3.0000 mL | Freq: Two times a day (BID) | INTRAMUSCULAR | Status: DC
  Administered 2013-03-30 – 2013-04-05 (×9): 3 mL via INTRAVENOUS

## 2013-03-30 MED ORDER — AMIODARONE HCL 200 MG PO TABS
200.0000 mg | ORAL_TABLET | Freq: Every day | ORAL | Status: DC
  Administered 2013-03-31 – 2013-04-06 (×7): 200 mg via ORAL
  Filled 2013-03-30 (×8): qty 1

## 2013-03-30 MED ORDER — ONDANSETRON HCL 4 MG/2ML IJ SOLN: 4.0000 mg | Freq: Four times a day (QID) | INTRAMUSCULAR | Status: DC | PRN

## 2013-03-30 MED ORDER — ONDANSETRON HCL 4 MG PO TABS: 4.0000 mg | ORAL_TABLET | Freq: Four times a day (QID) | ORAL | Status: DC | PRN

## 2013-03-30 MED ORDER — METOPROLOL TARTRATE 25 MG PO TABS
25.0000 mg | ORAL_TABLET | Freq: Two times a day (BID) | ORAL | Status: DC
  Administered 2013-03-30 – 2013-04-06 (×12): 25 mg via ORAL
  Filled 2013-03-30 (×15): qty 1

## 2013-03-30 MED ORDER — DOCUSATE SODIUM 100 MG PO CAPS
100.0000 mg | ORAL_CAPSULE | Freq: Two times a day (BID) | ORAL | Status: DC
  Administered 2013-03-30 – 2013-04-04 (×9): 100 mg via ORAL
  Filled 2013-03-30 (×12): qty 1

## 2013-03-30 MED ORDER — POLYETHYLENE GLYCOL 3350 17 G PO PACK
17.0000 g | PACK | Freq: Every day | ORAL | Status: DC | PRN
  Filled 2013-03-30: qty 1

## 2013-03-30 MED ORDER — POLYETHYLENE GLYCOL 3350 17 G PO PACK: 17.0000 g | PACK | Freq: Every day | ORAL | Status: DC | PRN

## 2013-03-30 MED ORDER — OXYCODONE-ACETAMINOPHEN 5-325 MG PO TABS
1.0000 | ORAL_TABLET | ORAL | Status: DC | PRN
  Administered 2013-03-31 – 2013-04-05 (×4): 1 via ORAL
  Filled 2013-03-30 (×4): qty 1

## 2013-03-30 NOTE — H&P (Signed)
Internal Medicine H&P   Vital Signs  Entered weight: 173 lbs., Calculated Weight: 173 lbs., ( 78.47 kg)  Height: 71 in., ( 180.34 cm)  Pulse rate: 64 Pulse rhythm: regular Respirations: 16  Blood Pressure #1: 82 / 34 mm Hg  BMI: 24.13 BSA: 1.98 Wt Chg: 0 lbs since 03/17/2013  Vitals entered by: Prudencio Pair, CMA on March 30, 2013 2:27 PM  Pulse Oximetry  O2 Saturation: 95 %    History of Present Illness  History from: patient  Reason for visit: Routine Follow-up  Chief Complaint: Patient went to the ER for a Fall Sunday, he was discharged but ended up going back Monday. Patient feels like he is almost in and out of consciousness, he is very weak and has no strength. Patient only missed his Coumadin yesterday.  History of Present Illness: 80 y/o M presents for ER f/u for fall sustained Sunday afternoon around 3pm when getting out of the car after syncopal episode [place] . He admits to having a glass of wine at lunch followed by 2 gin/tonics and one vicodin. He was eval with CT Head and XR Knee per  family with no acute findings. He was released, but returned on Monday due to weakness.  Had hematoma on foot. Told to elevate and use ice. Has been persistenly weak with mild nausea but no c/o CP, denies blood in stool, abd pain, hematuria, unusual bruising other than of fall area.  On Coumadin, didn't take dosing last night  PT/INR today is 2.4  Hgb today in office is 7.2. It was 8.9 24 hours prior. He denies any GI glood loss. Does not appear to have hematoma anywhere to explain 2gm blood loss. His BP is soft today w/o tachycardia (on BB) so will be admitted for RBC xfusion and anemia w/u  12 point ROS was performed and negative except as noted above  Review of Systems  General:  Complains of anorexia, fatigue.  Denies fevers, headache, sweats.  Eyes:  Denies blurring, irritation, discharge.  Ears/Nose/Throat:  Denies earache, decreased hearing, nasal congestion, nosebleeds, sore throat,  hoarseness.  Cardiovascular:  Complains of syncope, dyspnea on exertion, peripheral edema.  Denies chest pains.  Respiratory:  Denies cough, hemoptysis, wheezing.  Gastrointestinal:  Complains of nausea.  Denies vomiting, diarrhea, constipation, abdominal pain, melena, hematochezia.  Genitourinary:  Denies hematuria, urinary frequency, urinary hesitancy.  Musculoskeletal:  Complains of back pain, joint pain, joint swelling, muscle weakness.  Skin:  Denies rash, itching, dryness.  Neurologic:  Complains of weakness, dizziness, gait instability.  Endocrine:  Denies polydipsia, polyphagia, polyuria.  Heme/Lymphatic:  Denies bleeding, enlarged lymph nodes.  Past History  Past Medical History (reviewed - no changes required): anemia of chronic disease - normal BM bx, colonosocpy and EGD in anemia w/u 2014  BPH  hx of falls , last was ~ 2012  CKD, stage 3  severe carotid stenosis // HLD // afib  osteopenia w/ increased frax  scoliosis // lumbar pain  macular degeneration : R eye blind, L eye nearly blind  B/L hearing loss - has not seen audiologist in 2 years  OSA on CPAP  arthritis  Lexiscan myoview 07/24/12: no ST changes indicative of ischemia . normal perfusion w/o ischemia or scar  TTE 06/29/12: EF 55%. grade 1 diastolic dysfunction. mild TR. moderate pulm HTN  (Dr Hardin Negus * pain management) ( Dr Sabra Heck * optho ) (audiologist pending ) ( Dr Einar Gip * cards )( [facility] ) (Dr Halford Chessman * neurology, sleep ) (  [facility] )  Surgical History (reviewed - no changes required): 2 back surgeries to "free up the nerves"  broken both legs  Family History (reviewed - no changes required): mom - d - pancreatic CA at young age  dad - d - prostate CA at 50  brother - back problems; foot drop  Social History (reviewed - no changes required): wife is Pamala Hurry and married 14+ years. has 3 step children in 11s . 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 has 3 step children in 69s . 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  tob - past - quit 2012 - smoked 60+ years  EtOH - gin and tonic - 1-2 per evening  Drugs - none  Physical Exam  General appearance: pleasant, elderly male pale, in wheelchair  Eyes  External: conjunctivae and lids normal  Ears, Nose and Throat  External ears: normal, no lesions or deformities  External nose: normal, no lesions or deformities  Nasal: erythema- nopurulent discharge  Pharynx: tongue normal, protrudes mid line, posterior pharynx without erythema or exudate  Respiratory  Respiratory effort: no intercostal retractions or use of accessory muscles  Auscultation: no rales, rhonchi, or wheezes  Cardiovascular  Auscultation: S1, S2, no murmur, rub, or gallop  Periph. circulation: no cyanosis, clubbing, trace edema  Gastrointestinal  Abdomen: soft, non-tender, no masses, bowel sounds normal  Musculoskeletal  Gait and station: wheelchair  Right Knee: R knee swollen , tight with milld superficial abrasions  Skin  Inspection: hemtoma on scalp and ecchymosis on neck  Neurologic  Sensation: intact to touch, vibration  Speech: Normal  Mental Status Exam  Judgment, insight: intact  Orientation: oriented to time, place, and person  Memory: intact  Impression & Recommendations:  Problem # 1: Hypotension (ICD-458.9) (ICD10-I95.9)  Assessment: Unchanged  B/P: 82/34  Orders:  Pulse Ox ([id number])  anemia worsening with dehyration and nausea after  fall  admission for transfusion and evaluation  Problem # 2: Syncope (ICD-780.2) ([id number])  Assessment: Unchanged  from Sat with pain meds and alcohol  eval with neg CT HEAD  Now with worsening anemia  to admission for evaluation and treatment of anemia  CBC:  WBC: 7.80 (03/17/2013 12:53:00 PM)  RBC: 3.2 M/UL  HGB: 10.1  MCV: 94.5  MCH: 31.5  Platelet: 236  Problem # 3: Nausea alone (ICD-787.02) (ICD10-R11.0)  Assessment: New  Due to hypotension, anemia and dehydration by assessment at this time  Admission for evaluation and treatment  Problem # 4: Coumadin therapy (ICD-V58.61) (ICD10-Z79.01)  holding coumadin for now while w/u anemia  daily INR  on coumadin for a fib  Orders:  PT/INR (99371) 2.4  Orders:  PT/INR (69678)  Problem # 5: Anemia (ICD-285.9) (ICD10-D64.9)  Assessment: Deteriorated  His updated medication list for this problem includes:  B-6 Folic Acid 938-1017-51 Mcg-mcg-mg Caps (Cobalamine combinations) .Marland Kitchen... Take 1 capsule by mouth every  day  Orders:  PT/INR (29562)  INR 2.4  CBC Hgb 7.2  Admission for transfunsion, evaluation  Transportation from our office by non emergent EMS for elective admission to telemetry bed , pt is agreeable  xfuse 2U RBC tonight . sign consent in AM  ordering SPEP, folate, ferritin, iron, FOBT, haptoglobin, LDH  consult heme in AM , they were on board w/ w/u as outpt  normal GI w/u last year for anemia (EGD and colonoscopy were normal )  Orders:  PT/INR (13086)  Problem # 6: ACCIDENTAL FALLS, RECURRENT (ICD-E888.9) [id number])  Assessment: Deteriorated  Orders:  CBC/Platelets (CPT-85025) Hgb 7.2  PT/INR (57846) 2.4  aggrivated by hypotension, anemia , weakness from dehydration and alcohol use combined with pain medication  Orders:  CBC/Platelets [id number])  PT/INR (96295)  Problem # 7: CHRONIC KIDNEY DISEASE STAGE III (MODERATE) (ICD-585.3) (ICD10-N18.3)  Assessment: Unchanged  monitor daily in house  Problem # 8:  Atrial fibrillation (ICD-427.31) (ICD10-I48.0)  Assessment: Unchanged  His updated medication list for this problem includes:  Coumadin 2 Mg Tabs (Warfarin sodium) .Marland Kitchen... Take 1 & 1/2 tabs daily except 1 tablet on friday or as instructed  Metoprolol Tartrate 25 Mg Tabs (Metoprolol tartrate) .Marland Kitchen... Take 1 tablet by mouth twice daily  Amiodarone Hcl 200 Mg Tabs (Amiodarone hcl) .Marland Kitchen... Take 1 tablet by mouth every day  cont all meds except coumadin for now  tele bed  Dr Einar Gip is cardiologist  Assessment: Unchanged  Medications Added to Medication List This Visit:  1) Percocet 5-325 Mg Oral Tabs (Oxycodone-acetaminophen) .... Take 1-2 tablets by mouth every 4 hours as needed  Complete Medication List:  1) Percocet 5-325 Mg Oral Tabs (Oxycodone-acetaminophen) .... Take 1-2 tablets by mouth every 4 hours as needed  2) Allopurinol 300 Mg Tabs (Allopurinol) .... Take 1 tablet by mouth every day  3) Coumadin 2 Mg Tabs (Warfarin sodium) .... Take 1 & 1/2 tabs daily except 1 tablet on friday or as instructed  4) B-6 Folic Acid 123456 Mcg-mcg-mg Caps (Cobalamine combinations) .... Take 1 capsule by mouth every day  5) Dss 100 Mg Caps (Docusate sodium) .... Take 1 capsule by mouth in the morning and 1 capsule by mouth in the evening  6) Miralax Powd (Polyethylene glycol 3350) .... Take every other day as directed  7) Protonix 40 Mg Tbec (Pantoprazole sodium) .... Take 1 tablet by mouth at 6 am  8) Metoprolol Tartrate 25 Mg Tabs (Metoprolol tartrate) .... Take 1 tablet by mouth twice daily  9) Amiodarone Hcl 200 Mg Tabs (Amiodarone hcl) .... Take 1 tablet by mouth every day  10) Colcrys 0.6 Mg Tabs (Colchicine) .... 2 tabs w/ first dose of allopurinol,then 1 tab 1hr later. afterwards take 1 tab daily w/ gout flareups only  11) Vitamin D3 1000 Unit Tabs (Cholecalciferol) .... Take 1 tab daily  12) Calcium-d 600-400 Mg-unit Tabs (Calcium carbonate-vitamin d) .... Take 1 tab twice a day  13) Alendronate  Sodium 70 Mg Tabs (Alendronate sodium) .... Take 1 tab once a week; take with a full glass of water, 30 min prior to any food, and do not lay down for 30 minutes after taking.  14) Icaps Lutein & Omega-3 Caps (Multiple vitamins-minerals) .... Take 1 tab daily  15) Norco 10-325 Mg Tabs (Hydrocodone-acetaminophen) .... Take 1 tablet by mouth every 6 hours as needed for pain  16) Celexa 20 Mg Tabs (Citalopram hydrobromide) .... Take 1 tab daily  17) Morphine Sulfate Er 30 Mg Xr12h-tab (Morphine sulfate) .Marland KitchenMarland KitchenMarland Kitchen  Take 1 tab every 8 hrs  Comments: [facility] tele[facility] bed  ]

## 2013-03-31 ENCOUNTER — Other Ambulatory Visit: Payer: Self-pay | Admitting: Hematology and Oncology

## 2013-03-31 DIAGNOSIS — R55 Syncope and collapse: Secondary | ICD-10-CM

## 2013-03-31 DIAGNOSIS — S7010XA Contusion of unspecified thigh, initial encounter: Secondary | ICD-10-CM

## 2013-03-31 DIAGNOSIS — I4891 Unspecified atrial fibrillation: Secondary | ICD-10-CM

## 2013-03-31 DIAGNOSIS — D638 Anemia in other chronic diseases classified elsewhere: Secondary | ICD-10-CM

## 2013-03-31 DIAGNOSIS — N189 Chronic kidney disease, unspecified: Secondary | ICD-10-CM

## 2013-03-31 DIAGNOSIS — D649 Anemia, unspecified: Secondary | ICD-10-CM

## 2013-03-31 LAB — BASIC METABOLIC PANEL
BUN: 33 mg/dL — AB (ref 6–23)
CALCIUM: 8.3 mg/dL — AB (ref 8.4–10.5)
CO2: 24 meq/L (ref 19–32)
Chloride: 101 mEq/L (ref 96–112)
Creatinine, Ser: 1.82 mg/dL — ABNORMAL HIGH (ref 0.50–1.35)
GFR calc Af Amer: 37 mL/min — ABNORMAL LOW (ref >90)
GFR, EST NON AFRICAN AMERICAN: 32 mL/min — AB (ref >90)
Glucose, Bld: 115 mg/dL — ABNORMAL HIGH (ref 70–99)
Potassium: 5.1 mEq/L (ref 3.7–5.3)
Sodium: 135 mEq/L — ABNORMAL LOW (ref 137–147)

## 2013-03-31 LAB — CBC
HCT: 24.3 % — ABNORMAL LOW (ref 39.0–52.0)
Hemoglobin: 8.2 g/dL — ABNORMAL LOW (ref 13.0–17.0)
MCH: 31.4 pg (ref 26.0–34.0)
MCHC: 33.7 g/dL (ref 30.0–36.0)
MCV: 93.1 fL (ref 78.0–100.0)
PLATELETS: 167 10*3/uL (ref 150–400)
RBC: 2.61 MIL/uL — AB (ref 4.22–5.81)
RDW: 15.5 % (ref 11.5–15.5)
WBC: 10.9 10*3/uL — ABNORMAL HIGH (ref 4.0–10.5)

## 2013-03-31 LAB — URINALYSIS W MICROSCOPIC (NOT AT ARMC)
Bilirubin Urine: NEGATIVE
GLUCOSE, UA: NEGATIVE mg/dL
Ketones, ur: NEGATIVE mg/dL
LEUKOCYTES UA: NEGATIVE
Nitrite: NEGATIVE
PROTEIN: NEGATIVE mg/dL
SPECIFIC GRAVITY, URINE: 1.027 (ref 1.005–1.030)
Urobilinogen, UA: 0.2 mg/dL (ref 0.0–1.0)
pH: 6 (ref 5.0–8.0)

## 2013-03-31 LAB — FERRITIN: Ferritin: 511 ng/mL — ABNORMAL HIGH (ref 22–322)

## 2013-03-31 MED ORDER — POLYETHYLENE GLYCOL 3350 17 G PO PACK
17.0000 g | PACK | Freq: Every day | ORAL | Status: DC
  Administered 2013-03-31 – 2013-04-02 (×3): 17 g via ORAL
  Filled 2013-03-31 (×3): qty 1

## 2013-03-31 MED ORDER — BISACODYL 5 MG PO TBEC
5.0000 mg | DELAYED_RELEASE_TABLET | Freq: Every morning | ORAL | Status: DC
  Administered 2013-03-31 – 2013-04-03 (×4): 5 mg via ORAL
  Filled 2013-03-31 (×4): qty 1

## 2013-03-31 MED ORDER — WARFARIN SODIUM 4 MG PO TABS
4.0000 mg | ORAL_TABLET | Freq: Every day | ORAL | Status: DC
  Administered 2013-03-31: 4 mg via ORAL
  Filled 2013-03-31 (×2): qty 1

## 2013-03-31 MED ORDER — WARFARIN - PHARMACIST DOSING INPATIENT: Freq: Every day | Status: DC

## 2013-03-31 NOTE — Progress Notes (Addendum)
Physician Daily Progress Note  Subjective: Hungry No other complaints this AM   Objective: Vital signs in last 24 hours: Temp:  [97.4 F (36.3 C)-99.6 F (37.6 C)] 99.1 F (37.3 C) (03/18 0536) Pulse Rate:  [18-81] 75 (03/18 0536) Resp:  [18-20] 18 (03/18 0536) BP: (107-155)/(47-65) 110/47 mmHg (03/18 0536) SpO2:  [96 %-100 %] 98 % (03/18 0536) Weight:  [78.4 kg (172 lb 13.5 oz)] 78.4 kg (172 lb 13.5 oz) (03/17 1610) Weight change:  Last BM Date: 03/28/13  CBG (last 3)  No results found for this basename: GLUCAP,  in the last 72 hours  Intake/Output from previous day:  Intake/Output Summary (Last 24 hours) at 03/31/13 0728 Last data filed at 03/31/13 0350  Gross per 24 hour  Intake 682.99 ml  Output      0 ml  Net 682.99 ml   03/17 0701 - 03/18 0700 In: 683 [I.V.:3; Blood:680] Out: -   Physical Exam General appearance: WM in NAD  Eyes: no scleral icterus Throat: oropharynx moist without erythema Resp: CTAB, no wheezing/rales  Cardio: RRR, no MRG  GI: soft, non-tender; bowel sounds normal; no masses,  no organomegaly Extremities: edema of R thigh and knee w/o ecchymosis  Skin: ecchymosis on R neck    Lab Results:  Recent Labs  03/29/13 0327 03/30/13 1901  NA 139 135*  K 5.3 5.1  CL 103 98  CO2 25 24  GLUCOSE 143* 119*  BUN 29* 38*  CREATININE 1.74* 2.19*  CALCIUM 8.3* 8.5     Recent Labs  03/29/13 0327  AST 23  ALT 11  ALKPHOS 74  BILITOT 0.2*  PROT 6.1  ALBUMIN 3.2*     Recent Labs  03/29/13 0327 03/30/13 1901  WBC 10.4 14.2*  NEUTROABS 8.2* 10.6*  HGB 8.9* 7.0*  HCT 27.3* 21.3*  MCV 94.8 95.1  PLT 219 204    Lab Results  Component Value Date   INR 2.12* 03/30/2013   INR 2.33* 03/29/2013   INR 2.22* 03/28/2013    No results found for this basename: CKTOTAL, CKMB, CKMBINDEX, TROPONINI,  in the last 72 hours  No results found for this basename: TSH, T4TOTAL, FREET3, T3FREE, THYROIDAB,  in the last 72 hours   Recent Labs   03/30/13 1901  FERRITIN 511*  TIBC 200*  IRON 15*    Micro Results: No results found for this or any previous visit (from the past 240 hour(s)).  Studies/Results: Ct Abdomen Pelvis W Contrast  03/30/2013   CLINICAL DATA:  Fall.  Coumadin therapy.  Anemia.  EXAM: CT ABDOMEN AND PELVIS WITH CONTRAST  TECHNIQUE: Multidetector CT imaging of the abdomen and pelvis was performed using the standard protocol following bolus administration of intravenous contrast.  CONTRAST:  15mL OMNIPAQUE IOHEXOL 300 MG/ML  SOLN  COMPARISON:  MR L SPINE WO/W CM dated 12/22/2009  FINDINGS: Liver normal. Spleen normal. Pancreas normal. No biliary distention. Gallbladder is unremarkable. Portal and splenic vein patent.  Adrenals normal. Simple right renal cyst. Kidneys are otherwise unremarkable. No hydronephrosis or evidence of obstructing ureteral stone. Bladder is moderately distended. The prostate is not enlarged.  No significant adenopathy. Abdominal aorta normal in caliber. Aorto iliac and visceral vascular disease present. The visceral vessels are patent. No aneurysm. No retroperitoneal hemorrhage.  Appendix normal. No inflammatory change right or left lower quadrant. Stool present throughout the colon. No bowel distention. Stomach is nondistended. No free air. No mesenteric mass.  Heart size normal. Coronary artery disease. Bilateral pleural thickening  with calcified pleural plaques are present. Prior asbestos exposure could present in this fashion. Tiny umbilical hernia and small bilateral inguinal hernias with herniation of fat only. Prominent right upper anterior thigh soft tissue swelling is noted . Hemorrhage in this region cannot be excluded. Subcutaneous edema is also present is region. .  IMPRESSION: 1. Prominent right upper thigh soft tissue muscular soft tissue swelling and possible hemorrhage. No evidence of retroperitoneal hemorrhage. No associated fracture. The patient has a right hip replacement. 2. 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 3. Coronary artery disease.   Electronically Signed   By: Marcello Moores  Register   On: 03/30/2013 23:12     Medications: Scheduled: . allopurinol  100 mg Oral Daily  . amiodarone  200 mg Oral Daily  . citalopram  20 mg Oral Daily  . docusate sodium  100 mg Oral BID  . metoprolol tartrate  25 mg Oral BID  . morphine  30 mg Oral Q12H  . pantoprazole  40 mg Oral Q0600  . polyethylene glycol  17 g Oral Daily  . sodium chloride  3 mL Intravenous Q12H  . warfarin  4 mg Oral q1800  . Warfarin - Pharmacist Dosing Inpatient   Does not apply q1800   Continuous: . sodium chloride 125 mL/hr at 03/30/13 1831    Assessment/Plan: 1. Acute on chronic anemia of chronic disease 2/2 acute blood loss - - daily CBC. S/p pRBC xfusion on 3/17 - source currently is most likely from hematoma of R thigh after fall on coumadin . Ordering MRI to eval  - no intra-abd bleed/hematoma and no appearance of GI bleed  - heme will see patient in office on 3/25 - not currently good candidate for arenesp treatment given DVT risk in immobile patient   2. Knee edema after fall - ordering MRI of R knee . Placing ice pack to sight for swelling . Reviewed Xray of R femur w/o fracture   # leukocytosis - monitor . No clear sign/symptom of infection. Ordering U/A and culture today  # falls - seeing Dr Einar Gip on 3/26 at 1145 to discuss whether continuing coumadin would be rec'd in the setting of his falls. PT eval ordered   # syncope - CUS to eval his hx of carotid stenosis as possible cause of the fall.   3. Constipation - dulcolax, miralax, colace   4. afib - rate controlled. Home meds. Resume coumadin today  5. Chronic pain control - on home meds   6. Dispo - ordering PT eval given recent fall. Back to ALF when able.     LOS: 1 day   Jordyn Hofacker 03/31/2013, 7:28 AM

## 2013-03-31 NOTE — Consult Note (Signed)
[place] CONSULT NOTE  Patient Care Team: Velna Hatchet, MD as PCP - General (Internal Medicine) Nicholaus Bloom, MD (Anesthesiology) Laverda Page, MD as Attending Physician (Cardiology) Heath Lark, MD as Consulting Physician (Hematology and Oncology)  CHIEF COMPLAINTS/PURPOSE OF CONSULTATION:  Acute on chronic anemia  HISTORY OF PRESENTING ILLNESS:  Ricardo Allen 80 y.o. male is here because of recent syncopal episode. This patient had background history of atrial fibrillation and on chronic anticoagulation therapy. The patient was evaluated on 03/28/2013 the CT scan of the head and neck which showed no acute fracture or stroke. He has significant facial trauma with bruises. The patient was readmitted through the emergency department yesterday after feeling unwell and was noted to have acute drop in his hemoglobin. I have been following the patient in the outpatient area for anemia chronic disease. The last time I saw him, I discontinued his iron supplement due to iron overload. His baseline hemoglobin prior to permission is around 10.5 g. Since admission, the patient received 2 units of blood  Transfusion. His hemoglobin improved from 7-8.2.  He underwent CT scan evaluation showed no retroperitoneal bleed. That is abnormalities in his right upper thigh suspicious for possible hematoma.  He denies recent chest pain on exertion, shortness of breath on minimal exertion, or palpitations. He had not noticed any recent bleeding such as epistaxis, hematuria or hematochezia. He did have significant bruising from his recent fall.   MEDICAL HISTORY:  Past Medical History  Diagnosis Date  . ANEMIA NOS 08/19/2006  . Atrial fibrillation 11/27/2009  . DEPRESSION 08/27/2006  . DISORDER, LUMBAR DISC W/MYELOPATHY 08/19/2006  . GOUT 03/22/2009  . HYPERLIPIDEMIA 08/27/2006  . HYPOTENSION, ORTHOSTATIC 10/31/2009  . Impaired fasting glucose 01/27/2007  . LOW BACK PAIN 02/03/2007  . LUMBAR  RADICULOPATHY, LEFT 10/09/2006  . LUNG NODULE 03/29/2008  . MELANOMA, MALIGNANT, TRUNK 08/19/2006    early stage; just exicion.   . OBSTRUCTIVE SLEEP APNEA 08/19/2006  . TRAUMATIC ARTHROPATHY PELVIC REGION AND THIGH 06/29/2009  . TOBACCO ABUSE 12/31/2007  . Wet senile macular degeneration   . Diverticulosis   . Anemia of other chronic disease   . Complex sleep apnea syndrome 08/19/2006          SURGICAL HISTORY: Past Surgical History  Procedure Laterality Date  . Cataract extraction    . Lumbar laminectomy    . Lumbar fusion    . Tonsilectomy, adenoidectomy, bilateral myringotomy and tubes    . Melanoma excision      left lower back  . Esophagogastroduodenoscopy N/A 06/20/2012    Procedure: ESOPHAGOGASTRODUODENOSCOPY (EGD);  Surgeon: Inda Castle, MD;  Location: [facility];  Service: Endoscopy;  Laterality: N/A;  . Colonoscopy N/A 06/21/2012    Procedure: COLONOSCOPY;  Surgeon: Inda Castle, MD;  Location: [facility];  Service: Endoscopy;  Laterality: N/A;    SOCIAL HISTORY: History   Social History  . Marital Status: Married    Spouse Name: N/A    Number of Children: 0  . Years of Education: N/A   Occupational History  .      interior designer/architect   Social History Main Topics  . Smoking status: Former Smoker -- 2.00 packs/day for 60 years    Types: Cigarettes    Quit date: 04/14/2008  . Smokeless tobacco: Never Used  . Alcohol Use: 4.4 oz/week    4 Glasses of wine, 4 Drinks containing 0.5 oz of alcohol per week  . Drug Use: No  . Sexual Activity:  No   Other Topics Concern  . Not on file   Social History Narrative  . No narrative on file    FAMILY HISTORY: Family History  Problem Relation Age of Onset  . Pancreatic cancer Mother   . Cancer Mother     pancreas  . Testicular cancer Father   . Cancer Father     testicular  . Arthritis Brother   . Cancer Maternal Aunt     head/neck cancer    ALLERGIES:  is allergic to fentanyl; hydromorphone  hcl; and sulfonamide derivatives.  MEDICATIONS:  Current Facility-Administered Medications  Medication Dose Route Frequency Provider Last Rate Last Dose  . 0.9 %  sodium chloride infusion   Intravenous Continuous Velna Hatchet, MD 125 mL/hr at 03/31/13 1103    . allopurinol (ZYLOPRIM) tablet 100 mg  100 mg Oral Daily Velna Hatchet, MD   100 mg at 03/31/13 1104  . amiodarone (PACERONE) tablet 200 mg  200 mg Oral Daily Velna Hatchet, MD   200 mg at 03/31/13 1104  . bisacodyl (DULCOLAX) EC tablet 5 mg  5 mg Oral q morning - 10a Velna Hatchet, MD   5 mg at 03/31/13 1105  . citalopram (CELEXA) tablet 20 mg  20 mg Oral Daily Velna Hatchet, MD   20 mg at 03/31/13 1104  . docusate sodium (COLACE) capsule 100 mg  100 mg Oral BID Velna Hatchet, MD   100 mg at 03/31/13 1104  . metoprolol tartrate (LOPRESSOR) tablet 25 mg  25 mg Oral BID Velna Hatchet, MD   25 mg at 03/31/13 1105  . morphine (MS CONTIN) 12 hr tablet 30 mg  30 mg Oral Q12H Velna Hatchet, MD   30 mg at 03/31/13 1105  . ondansetron (ZOFRAN) tablet 4 mg  4 mg Oral Q6H PRN Velna Hatchet, MD       Or  . ondansetron (ZOFRAN) injection 4 mg  4 mg Intravenous Q6H PRN Velna Hatchet, MD      . oxyCODONE-acetaminophen (PERCOCET/ROXICET) 5-325 MG per tablet 1-2 tablet  1-2 tablet Oral Q4H PRN Velna Hatchet, MD   1 tablet at 03/31/13 0510  . pantoprazole (PROTONIX) EC tablet 40 mg  40 mg Oral Q0600 Velna Hatchet, MD   40 mg at 03/31/13 0510  . polyethylene glycol (MIRALAX / GLYCOLAX) packet 17 g  17 g Oral Daily Velna Hatchet, MD   17 g at 03/31/13 1105  . sodium chloride 0.9 % injection 3 mL  3 mL Intravenous Q12H Velna Hatchet, MD   3 mL at 03/30/13 2204  . warfarin (COUMADIN) tablet 4 mg  4 mg Oral [address], [facility]      . Warfarin - Pharmacist Dosing Inpatient   Does not apply q1800 Rogue Bussing, [facility]        REVIEW OF SYSTEMS:   Constitutional: Denies fevers, chills or abnormal night sweats Eyes: Denies  blurriness of vision, double vision or watery eyes Ears, nose, mouth, throat, and face: Denies mucositis or sore throat Respiratory: Denies cough, dyspnea or wheezes Cardiovascular: Denies palpitation, chest discomfort. He does have mild right leg swelling on the upper thigh area.  Gastrointestinal:  Denies nausea, heartburn or change in bowel habits Skin: Denies abnormal skin rashes. Extensive bruises are noted  Lymphatics: Denies new lymphadenopathy  Neurological:Denies numbness, tingling or new weaknesses Behavioral/Psych: Mood is stable, no new changes  All other systems were reviewed with the patient and are negative.  PHYSICAL EXAMINATION: ECOG PERFORMANCE STATUS: 2 - Symptomatic, <  50% confined to bed  Filed Vitals:   03/31/13 1102  BP: 120/40  Pulse: 83  Temp:   Resp:    Filed Weights   03/30/13 1610  Weight: 172 lb 13.5 oz (78.4 kg)    GENERAL:alert, no distress and comfortable SKIN:  noticed significant bruises on his body. No petechiae rash EYES: normal, conjunctiva are pink and non-injected, sclera clear OROPHARYNX:no exudate, no erythema and lips, buccal mucosa, and tongue normal  NECK: supple, thyroid normal size, non-tender, without nodularity LYMPH:  no palpable lymphadenopathy in the cervical, axillary or inguinal LUNGS: clear to auscultation and percussion with normal breathing effort HEART: regular rate & rhythm and no murmurs and no lower extremity edema ABDOMEN:abdomen soft, non-tender and normal bowel sounds Musculoskeletal:no cyanosis of digits and no clubbing . Significant swelling and edema in the right upper thigh  PSYCH: alert & oriented x 3 with fluent speech NEURO: no focal motor/sensory deficits  LABORATORY DATA:  I have reviewed the data as listed Lab Results  Component Value Date   WBC 10.9* 03/31/2013   HGB 8.2* 03/31/2013   HCT 24.3* 03/31/2013   MCV 93.1 03/31/2013   PLT 167 03/31/2013    RADIOGRAPHIC STUDIES:I REVIEWED THE IMAGING STUDY  MYSELF AND AGREE WITH THE INTERPRETATION.  Ct Abdomen Pelvis W Contrast  03/30/2013   CLINICAL DATA:  Fall.  Coumadin therapy.  Anemia.  EXAM: CT ABDOMEN AND PELVIS WITH CONTRAST  TECHNIQUE: Multidetector CT imaging of the abdomen and pelvis was performed using the standard protocol following bolus administration of intravenous contrast.  CONTRAST:  22mL OMNIPAQUE IOHEXOL 300 MG/ML  SOLN  COMPARISON:  MR L SPINE WO/W CM dated 12/22/2009  FINDINGS: Liver normal. Spleen normal. Pancreas normal. No biliary distention. Gallbladder is unremarkable. Portal and splenic vein patent.  Adrenals normal. Simple right renal cyst. Kidneys are otherwise unremarkable. No hydronephrosis or evidence of obstructing ureteral stone. Bladder is moderately distended. The prostate is not enlarged.  No significant adenopathy. Abdominal aorta normal in caliber. Aorto iliac and visceral vascular disease present. The visceral vessels are patent. No aneurysm. No retroperitoneal hemorrhage.  Appendix normal. No inflammatory change right or left lower quadrant. Stool present throughout the colon. No bowel distention. Stomach is nondistended. No free air. No mesenteric mass.  Heart size normal. Coronary artery disease. Bilateral pleural thickening with calcified pleural plaques are present. Prior asbestos exposure could present in this fashion. Tiny umbilical hernia and small bilateral inguinal hernias with herniation of fat only. Prominent right upper anterior thigh soft tissue swelling is noted . Hemorrhage in this region cannot be excluded. Subcutaneous edema is also present is region. .  IMPRESSION: 1. Prominent right upper thigh soft tissue muscular soft tissue swelling and possible hemorrhage. No evidence of retroperitoneal hemorrhage. No associated fracture. The patient has a right hip replacement. 2. 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 3. Coronary artery disease.   Electronically Signed   By: Marcello Moores   Register   On: 03/30/2013 23:12    ASSESSMENT & PLAN #1 Acute on chronic anemia  I suspect the cause of this is from hematoma in the right thigh. The patient is on anticoagulation therapy. The patient was appropriately transfused. I do not feel strongly about using erythropoietin stimulating agents in an acute setting like this. Erythropoietin stimulating agents as well known to cause risk of blood clots. His mobility is not the greatest. I plan to follow him in my clinic next week and recheck his blood and transfuse as needed. Once  the patient improves over time, I suspect his baseline hemoglobin will be back to greater than 10 range and we may be able to avoid using erythropoietin stimulating agents. #2 atrial fibrillation on chronic anticoagulation therapy #3 recent syncopal episode The patient has high-risk of falls. This last ejection fraction within normal limits. He would benefit from outpatient cardiology consultation about switching his anticoagulation therapy to antiplatelet agents only if appropriate. I would defer to his cardiologist to make a recommendation. #4 chronic kidney disease His kidney function is stable. Recommend close observation.  All questions were answered. The patient knows to call the clinic with any problems, questions or concerns. I spent 30 minutes counseling the patient face to face. The total time spent in the appointment was 40 minutes and more than 50% was on counseling.   I will move his appt to next Wednesday 3/25 at 1030 am for labs and appointment to see me     [facility], Kimori Tartaglia, MD @T @ 12:23 PM

## 2013-03-31 NOTE — Care Management Note (Unsigned)
    Page 1 of 1   03/31/2013     2:16:36 PM   CARE MANAGEMENT NOTE 03/31/2013  Patient:  Ricardo Allen, Ricardo Allen   Account Number:  [id number]  Date Initiated:  03/31/2013  Documentation initiated by:  GRAVES-BIGELOW,Brekyn Huntoon  Subjective/Objective Assessment:   Pt admitted from [place]. In with low hgb. Transfused.     Action/Plan:   CM will continue to monitor for disposition needs. PT scheduled to work with pt.   Anticipated DC Date:  04/02/2013   Anticipated DC Plan:  ASSISTED LIVING / [place]  CM consult      Choice offered to / List presented to:             Status of service:  In process, will continue to follow Medicare Important Message given?   (If response is "NO", the following Medicare IM given date fields will be blank) Date Medicare IM given:   Date Additional Medicare IM given:    Discharge Disposition:    Per UR Regulation:  Reviewed for med. necessity/level of care/duration of stay  If discussed at [place] of Stay Meetings, dates discussed:    Comments:

## 2013-03-31 NOTE — Evaluation (Signed)
Physical Therapy Evaluation Patient Details Name: Ricardo Allen MRN: [id number] DOB: [birth year] Today's Date: 03/31/2013 Time: 2025-4270 PT Time Calculation (min): 14 min  PT Assessment / Plan / Recommendation History of Present Illness  80 y/o M presents for ER f/u for fall sustained Sunday afternoon around 3pm when getting out of the car after syncopal episode [place] . He admits to having a glass of wine at lunch followed by 2 gin/tonics and one vicodin. He was eval with CT Head and XR Knee per    Clinical Impression  Pt adm due to the above. Presents with limitations in functional mobility secondary to increased Rt knee pain and weakness in Rt LE. Pt to benefit from skilled acute PT to address deficits indicated below (see PT problem list) and maximize mobility while in hospital setting. Pt at this time is unsafe to D/C home with wife, who also ambulates with rollator. Pt will require SNF for post acute rehab and is agreeable at this time.     PT Assessment  Patient needs continued PT services    Follow Up Recommendations  SNF    Does the patient have the potential to tolerate intense rehabilitation      Barriers to Discharge Decreased caregiver support wife unable to provide physical (A)     Equipment Recommendations  None recommended by PT    Recommendations for Other Services OT consult   Frequency Min 3X/week    Precautions / Restrictions Precautions Precautions: Fall Precaution Comments: recent fall and syncope    Pertinent Vitals/Pain 8/10 with weightbearing      Mobility  Bed Mobility Overal bed mobility: Needs Assistance Bed Mobility: Supine to Sit Supine to sit: Min assist;HOB elevated General bed mobility comments: (A) to elevate trunk to sitting position; pt requires incr time due to pain in Rt knee; use of handrails and HOB elevated Transfers Overall transfer level: Needs assistance Equipment used: Rolling walker (2 wheeled) Transfers: Sit to/from  Stand Sit to Stand: Mod assist;From elevated surface General transfer comment: pt having difficulty powering up to stand; requiers mod (A) to reach standing position and clear bed; max cues for sequencing and hand placement; limited greatly by pain  Ambulation/Gait Ambulation/Gait assistance: Min assist Ambulation Distance (Feet): 6 Feet (3 fwd 3 backward) Assistive device: Rolling walker (2 wheeled) Gait Pattern/deviations: Decreased step length - left;Decreased stance time - right;Trunk flexed;Wide base of support;Antalgic Gait velocity: greatly decreased  Gait velocity interpretation: <1.8 ft/sec, indicative of risk for recurrent falls         PT Diagnosis: Difficulty walking;Acute pain  PT Problem List: Decreased strength;Decreased range of motion;Decreased activity tolerance;Decreased balance;Decreased mobility;Decreased safety awareness;Decreased knowledge of use of DME;Pain PT Treatment Interventions: DME instruction;Gait training;Functional mobility training;Therapeutic activities;Therapeutic exercise;Balance training;Neuromuscular re-education;Patient/family education     PT Goals(Current goals can be found in the care plan section) Acute Rehab PT Goals Patient Stated Goal: to get better and go home PT Goal Formulation: With patient Time For Goal Achievement: 04/14/13 Potential to Achieve Goals: Good  Visit Information  Last PT Received On: 03/31/13 Assistance Needed: +1 History of Present Illness: 80 y/o M presents for ER f/u for fall sustained Sunday afternoon around 3pm when getting out of the car after syncopal episode [place] . He admits to having a glass of wine at lunch followed by 2 gin/tonics and one vicodin. He was eval with CT Head and XR Knee per         Prior [place] expects to  be discharged to:: Other (Comment) (independent living at [facility]) Living Arrangements: Spouse/significant other Additional Comments: spouse is in poor  health; uses rollator  Prior Function Level of Independence: Independent with assistive device(s) Comments: Pt states they also have a life alert that works from anywhere.  Communication Communication: No difficulties    Cognition  Cognition Arousal/Alertness: Awake/alert Behavior During Therapy: WFL for tasks assessed/performed Overall Cognitive Status: Within Functional Limits for tasks assessed    Extremity/Trunk Assessment Upper Extremity Assessment Upper Extremity Assessment: Defer to OT evaluation Lower Extremity Assessment Lower Extremity Assessment: RLE deficits/detail RLE Deficits / Details: grossly 2+/5 limited greatly by pain RLE: Unable to fully assess due to pain Cervical / Trunk Assessment Cervical / Trunk Assessment: Normal   Balance Balance Overall balance assessment: Needs assistance;History of Falls Sitting-balance support: Single extremity supported;Feet supported Sitting balance-Leahy Scale: Poor Sitting balance - Comments: UE supported Standing balance support: During functional activity;Bilateral upper extremity supported Standing balance-Leahy Scale: Poor Standing balance comment: bil UE supported and (A) to maintain balance; fwd flex posture and limited ability to WB through rt LE due to pain General Comments General comments (skin integrity, edema, etc.): Rt LE edematous  End of Session PT - End of Session Equipment Utilized During Treatment: Gait belt Activity Tolerance: Patient limited by pain Patient left: in bed;with call bell/phone within reach;Other (comment);with bed alarm set (Sitting EOB to eat dinner) Nurse Communication: Mobility status;Precautions;Patient requests pain meds  GP     Gustavus Bryant, [place] 03/31/2013, 5:23 PM

## 2013-03-31 NOTE — Progress Notes (Signed)
UR Completed Abdiaziz Klahn Graves-Bigelow, RN,BSN [phone]  

## 2013-03-31 NOTE — Progress Notes (Addendum)
Patient has blood dripping from urethra, especially after urination.  Patient denies any discomfort.  Dr. Ardeth Perfect notified.  OK to give Coumadin per MD.  Will continue to monitor. [place], Ardeth Sportsman

## 2013-03-31 NOTE — Progress Notes (Signed)
ANTICOAGULATION CONSULT NOTE - Initial Consult  Pharmacy Consult for Coumadin Indication: atrial fibrillation  Allergies  Allergen Reactions  . Fentanyl     REACTION: nausea and depression  . Hydromorphone Hcl     REACTION: depressed and nausea  . Sulfonamide Derivatives     REACTION: hives    Patient Measurements: Height: 5\' 11"  (180.3 cm) Weight: 172 lb 13.5 oz (78.4 kg) IBW/kg (Calculated) : 75.3  Vital Signs: Temp: 99.1 F (37.3 C) (03/18 0536) Temp src: Oral (03/18 0536) BP: 110/47 mmHg (03/18 0536) Pulse Rate: 75 (03/18 0536)  Labs:  Recent Labs  03/28/13 1655 03/29/13 0327 03/30/13 1901  HGB 10.0* 8.9* 7.0*  HCT 31.0* 27.3* 21.3*  PLT 228 219 204  APTT 34  --   --   LABPROT 23.9* 24.8* 23.1*  INR 2.22* 2.33* 2.12*  CREATININE 1.66* 1.74* 2.19*    Estimated Creatinine Clearance: 26.3 ml/min (by C-G formula based on Cr of 2.19).   Medical History: Past Medical History  Diagnosis Date  . ANEMIA NOS 08/19/2006  . Atrial fibrillation 11/27/2009  . DEPRESSION 08/27/2006  . DISORDER, LUMBAR DISC W/MYELOPATHY 08/19/2006  . GOUT 03/22/2009  . HYPERLIPIDEMIA 08/27/2006  . HYPOTENSION, ORTHOSTATIC 10/31/2009  . Impaired fasting glucose 01/27/2007  . LOW BACK PAIN 02/03/2007  . LUMBAR RADICULOPATHY, LEFT 10/09/2006  . LUNG NODULE 03/29/2008  . MELANOMA, MALIGNANT, TRUNK 08/19/2006    early stage; just exicion.   . OBSTRUCTIVE SLEEP APNEA 08/19/2006  . TRAUMATIC ARTHROPATHY PELVIC REGION AND THIGH 06/29/2009  . TOBACCO ABUSE 12/31/2007  . Wet senile macular degeneration   . Diverticulosis   . Anemia of other chronic disease   . Complex sleep apnea syndrome 08/19/2006          Medications:  Prescriptions prior to admission  Medication Sig Dispense Refill  . allopurinol (ZYLOPRIM) 100 MG tablet Take 1 tablet (100 mg total) by mouth daily.  30 tablet  5  . amiodarone (PACERONE) 200 MG tablet Take 1 tablet (200 mg total) by mouth daily.  30 tablet  5  . citalopram  (CELEXA) 20 MG tablet Take 1 tablet (20 mg total) by mouth daily.  30 tablet  5  . Cobalamine Combinations (B-6 FOLIC ACID) 315-1761-60 MCG-MCG-MG CAPS Take 1 capsule by mouth daily.  30 each  11  . docusate sodium 100 MG CAPS Take 100 mg by mouth 2 (two) times daily.  60 capsule  5  . ferrous sulfate (FERROUSUL) 325 (65 FE) MG tablet Take 1 tablet (325 mg total) by mouth daily with breakfast.  30 tablet  5  . HYDROcodone-acetaminophen (NORCO) 10-325 MG per tablet Take 1 tablet by mouth every 6 (six) hours as needed for pain.      . metoprolol tartrate (LOPRESSOR) 25 MG tablet Take 1 tablet (25 mg total) by mouth 2 (two) times daily.  60 tablet  5  . morphine (MS CONTIN) 30 MG 12 hr tablet Take 30 mg by mouth 2 (two) times daily.        Marland Kitchen oxyCODONE-acetaminophen (PERCOCET) 5-325 MG per tablet Take 1-2 tablets by mouth every 4 (four) hours as needed for severe pain.  10 tablet  0  . pantoprazole (PROTONIX) 40 MG tablet Take 1 tablet (40 mg total) by mouth daily at 6 (six) AM.  30 tablet  5  . Probiotic Product (PROBIOTIC DAILY PO) Take 1 capsule by mouth daily.       Marland Kitchen warfarin (COUMADIN) 2 MG tablet Take 2 tablets by  mouth daily.      Marland Kitchen alendronate (FOSAMAX) 70 MG tablet Take 70 mg by mouth every Sunday. Take with a full glass of water on an empty stomach.      . colchicine 0.6 MG tablet Take 0.6 mg by mouth daily as needed (for gout).      . polyethylene glycol (MIRALAX / GLYCOLAX) packet Take 17 g by mouth daily as needed (constipation).       Scheduled:  . allopurinol  100 mg Oral Daily  . amiodarone  200 mg Oral Daily  . citalopram  20 mg Oral Daily  . docusate sodium  100 mg Oral BID  . metoprolol tartrate  25 mg Oral BID  . morphine  30 mg Oral Q12H  . pantoprazole  40 mg Oral Q0600  . polyethylene glycol  17 g Oral Daily  . sodium chloride  3 mL Intravenous Q12H   Infusions:  . sodium chloride 125 mL/hr at 03/30/13 1831    Assessment: 80yo male was seen in ED Sun, Mon, and last  pm d/t weakness and "almost in and out of consciousness", had syncopal episode Sun after EtOH x3 + 1 Vicodin, had hemotoma of LE, no overt signs of bleeding but Hgb did decrease by 2g, to continue Coumadin for now for Afib; admitted w/ therapeutic INR.  Goal of Therapy:  INR 2-3   Plan:  Will continue home Coumadin dose of 4mg  daily and monitor INR for dose adjustments; follow w/ attending for any signs of bleeding.  Wynona Neat, PharmD, BCPS  03/31/2013,7:22 AM

## 2013-04-01 ENCOUNTER — Inpatient Hospital Stay (HOSPITAL_COMMUNITY): Payer: Medicare Other

## 2013-04-01 ENCOUNTER — Telehealth: Payer: Self-pay | Admitting: Hematology and Oncology

## 2013-04-01 DIAGNOSIS — R55 Syncope and collapse: Secondary | ICD-10-CM

## 2013-04-01 LAB — PROTIME-INR
INR: 2.42 — AB (ref 0.00–1.49)
Prothrombin Time: 25.5 seconds — ABNORMAL HIGH (ref 11.6–15.2)

## 2013-04-01 LAB — CBC
HCT: 22.6 % — ABNORMAL LOW (ref 39.0–52.0)
HCT: 22 % — ABNORMAL LOW (ref 39.0–52.0)
HEMOGLOBIN: 7.5 g/dL — AB (ref 13.0–17.0)
HEMOGLOBIN: 7.4 g/dL — AB (ref 13.0–17.0)
MCH: 31.3 pg (ref 26.0–34.0)
MCH: 31.5 pg (ref 26.0–34.0)
MCHC: 33.2 g/dL (ref 30.0–36.0)
MCHC: 33.6 g/dL (ref 30.0–36.0)
MCV: 94.2 fL (ref 78.0–100.0)
MCV: 93.6 fL (ref 78.0–100.0)
Platelets: 176 10*3/uL (ref 150–400)
Platelets: 187 10*3/uL (ref 150–400)
RBC: 2.4 MIL/uL — ABNORMAL LOW (ref 4.22–5.81)
RBC: 2.35 MIL/uL — AB (ref 4.22–5.81)
RDW: 15.4 % (ref 11.5–15.5)
RDW: 15.4 % (ref 11.5–15.5)
WBC: 8.9 10*3/uL (ref 4.0–10.5)
WBC: 10.3 10*3/uL (ref 4.0–10.5)

## 2013-04-01 LAB — BASIC METABOLIC PANEL
BUN: 25 mg/dL — ABNORMAL HIGH (ref 6–23)
CHLORIDE: 102 meq/L (ref 96–112)
CO2: 22 meq/L (ref 19–32)
Calcium: 8.3 mg/dL — ABNORMAL LOW (ref 8.4–10.5)
Creatinine, Ser: 1.38 mg/dL — ABNORMAL HIGH (ref 0.50–1.35)
GFR calc non Af Amer: 45 mL/min — ABNORMAL LOW (ref >90)
GFR, EST AFRICAN AMERICAN: 52 mL/min — AB (ref >90)
Glucose, Bld: 96 mg/dL (ref 70–99)
POTASSIUM: 4.5 meq/L (ref 3.7–5.3)
Sodium: 137 mEq/L (ref 137–147)

## 2013-04-01 LAB — PROTEIN ELECTROPHORESIS, SERUM
ALBUMIN ELP: 55.8 % (ref 55.8–66.1)
ALPHA-2-GLOBULIN: 10.9 % (ref 7.1–11.8)
Alpha-1-Globulin: 7.1 % — ABNORMAL HIGH (ref 2.9–4.9)
BETA 2: 5.7 % (ref 3.2–6.5)
BETA GLOBULIN: 6.1 % (ref 4.7–7.2)
Gamma Globulin: 14.4 % (ref 11.1–18.8)
M-Spike, %: NOT DETECTED g/dL
TOTAL PROTEIN ELP: 5.9 g/dL — AB (ref 6.0–8.3)

## 2013-04-01 LAB — URINE CULTURE: Colony Count: 70000

## 2013-04-01 MED ORDER — PHYTONADIONE 5 MG PO TABS
5.0000 mg | ORAL_TABLET | Freq: Once | ORAL | Status: AC
  Administered 2013-04-01: 5 mg via ORAL
  Filled 2013-04-01: qty 1

## 2013-04-01 MED ORDER — MORPHINE SULFATE 4 MG/ML IJ SOLN
4.0000 mg | Freq: Once | INTRAMUSCULAR | Status: AC
  Administered 2013-04-01: 4 mg via INTRAVENOUS

## 2013-04-01 MED ORDER — MORPHINE SULFATE 4 MG/ML IJ SOLN
INTRAMUSCULAR | Status: AC
  Administered 2013-04-01: 4 mg via INTRAVENOUS
  Filled 2013-04-01: qty 1

## 2013-04-01 NOTE — Progress Notes (Addendum)
Physician Daily Progress Note  Subjective: Tired from MRI and U/S today  No complaints overnight  Objective: Vital signs in last 24 hours: Temp:  [97.9 F (36.6 C)-98.9 F (37.2 C)] 98.9 F (37.2 C) (03/19 0623) Pulse Rate:  [74-83] 80 (03/19 0623) Resp:  [18] 18 (03/19 0623) BP: (112-124)/(40-73) 124/48 mmHg (03/19 0623) SpO2:  [93 %-99 %] 95 % (03/19 0623) Weight:  [84.777 kg (186 lb 14.4 oz)] 84.777 kg (186 lb 14.4 oz) (03/19 0623) Weight change: 6.377 kg (14 lb 1 oz) Last BM Date: 03/28/13  CBG (last 3)  No results found for this basename: GLUCAP,  in the last 72 hours  Intake/Output from previous day:  Intake/Output Summary (Last 24 hours) at 04/01/13 0716 Last data filed at 03/31/13 1700  Gross per 24 hour  Intake      0 ml  Output    900 ml  Net   -900 ml   03/18 0701 - 03/19 0700 In: -  Out: 900 [Urine:900]  Physical Exam General appearance: WM in NAD  Eyes: no scleral icterus Throat: oropharynx moist without erythema Resp: CTAB, no wheezing/rales  Cardio: RRR, no MRG  GI: soft, non-tender; bowel sounds normal; no masses,  no organomegaly Extremities: edema of R thigh and knee w/o ecchymosis  Skin: ecchymosis on R neck    Lab Results:  Recent Labs  03/30/13 1901 03/31/13 0820  NA 135* 135*  K 5.1 5.1  CL 98 101  CO2 24 24  GLUCOSE 119* 115*  BUN 38* 33*  CREATININE 2.19* 1.82*  CALCIUM 8.5 8.3*    No results found for this basename: AST, ALT, ALKPHOS, BILITOT, PROT, ALBUMIN,  in the last 72 hours   Recent Labs  03/30/13 1901 03/31/13 0820  WBC 14.2* 10.9*  NEUTROABS 10.6*  --   HGB 7.0* 8.2*  HCT 21.3* 24.3*  MCV 95.1 93.1  PLT 204 167    Lab Results  Component Value Date   INR 2.12* 03/30/2013   INR 2.33* 03/29/2013   INR 2.22* 03/28/2013    No results found for this basename: CKTOTAL, CKMB, CKMBINDEX, TROPONINI,  in the last 72 hours  No results found for this basename: TSH, T4TOTAL, FREET3, T3FREE, THYROIDAB,  in the last  72 hours   Recent Labs  03/30/13 1901  FERRITIN 511*  TIBC 200*  IRON 15*    Micro Results: No results found for this or any previous visit (from the past 240 hour(s)).  Studies/Results: Ct Abdomen Pelvis W Contrast  03/30/2013   CLINICAL DATA:  Fall.  Coumadin therapy.  Anemia.  EXAM: CT ABDOMEN AND PELVIS WITH CONTRAST  TECHNIQUE: Multidetector CT imaging of the abdomen and pelvis was performed using the standard protocol following bolus administration of intravenous contrast.  CONTRAST:  60mL OMNIPAQUE IOHEXOL 300 MG/ML  SOLN  COMPARISON:  MR L SPINE WO/W CM dated 12/22/2009  FINDINGS: Liver normal. Spleen normal. Pancreas normal. No biliary distention. Gallbladder is unremarkable. Portal and splenic vein patent.  Adrenals normal. Simple right renal cyst. Kidneys are otherwise unremarkable. No hydronephrosis or evidence of obstructing ureteral stone. Bladder is moderately distended. The prostate is not enlarged.  No significant adenopathy. Abdominal aorta normal in caliber. Aorto iliac and visceral vascular disease present. The visceral vessels are patent. No aneurysm. No retroperitoneal hemorrhage.  Appendix normal. No inflammatory change right or left lower quadrant. Stool present throughout the colon. No bowel distention. Stomach is nondistended. No free air. No mesenteric mass.  Heart size normal.  Coronary artery disease. Bilateral pleural thickening with calcified pleural plaques are present. Prior asbestos exposure could present in this fashion. Tiny umbilical hernia and small bilateral inguinal hernias with herniation of fat only. Prominent right upper anterior thigh soft tissue swelling is noted . Hemorrhage in this region cannot be excluded. Subcutaneous edema is also present is region. .  IMPRESSION: 1. Prominent right upper thigh soft tissue muscular soft tissue swelling and possible hemorrhage. No evidence of retroperitoneal hemorrhage. No associated fracture. The patient has a right  hip replacement. 2. 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 3. Coronary artery disease.   Electronically Signed   By: Marcello Moores  Register   On: 03/30/2013 23:12     Medications: Scheduled: . allopurinol  100 mg Oral Daily  . amiodarone  200 mg Oral Daily  . bisacodyl  5 mg Oral q morning - 10a  . citalopram  20 mg Oral Daily  . docusate sodium  100 mg Oral BID  . metoprolol tartrate  25 mg Oral BID  . morphine  30 mg Oral Q12H  . pantoprazole  40 mg Oral Q0600  . polyethylene glycol  17 g Oral Daily  . sodium chloride  3 mL Intravenous Q12H  . warfarin  4 mg Oral q1800  . Warfarin - Pharmacist Dosing Inpatient   Does not apply q1800   Continuous: . sodium chloride 125 mL/hr at 03/31/13 1943    Assessment/Plan: 1. Acute on chronic anemia of chronic disease 2/2 acute blood loss - -  Today CBC shows Hgb in 7s, but in setting of aggressive hydration. Stop IVF, hydrate by mouth, recheck this afternoon and xfuse if Hgb remains < 8.  - MRI confirms large hematoma in R lateralis muscle - stopping coumadin today given large hematoma w/ risk of progression. He has visit next week w/ Dr Einar Gip to discuss if resuming coumadin is warranted. He is high risk of bleed given recurrent falls.  - S/p pRBC xfusion on 3/17 - heme will see patient in office on 3/25 to recheck CBC and xfuse if needed  - not currently good candidate for arenesp treatment given DVT risk in immobile patient   # falls - seeing Dr Einar Gip on 3/26 at 1145 to discuss whether continuing coumadin would be rec'd in the setting of his falls and large hematoma. PT recs SNF for rehab   2. Knee edema after fall -   Placing ice pack to sight for swelling . Reviewed Xray of R femur w/o fracture . Placing in rehab for therapy of knee trauma. Not good surgical candidate   # leukocytosis - resolved . Neg u/a  # syncope - CUS today shows no progression of his carotid stenosis. Syncope likely 2/2 drug effect and  dehydration (drinks etoh while on narcotics and does not drink water)  # acute on chronic renal failure - resolved w/ IVF and PRBC transfusion  3. Constipation - dulcolax, miralax, colace   4. afib - rate controlled. Home meds. Resumed coumadin. INR pending today   5. Chronic pain control - on home meds   6. Dispo - PT recs SNF, I agree needs rehab. Will plan on D/C to SNF tomorrow if Hgb remains stable. SW c/s placed     LOS: 2 days   Brallan Denio 04/01/2013, 7:16 AM

## 2013-04-01 NOTE — Progress Notes (Signed)
Patient's Hgb 7.4 on evening labs.  Dr. Reynaldo Minium notified.  No new orders received.  Will continue to monitor. [place], Ardeth Sportsman

## 2013-04-01 NOTE — Clinical Documentation Improvement (Signed)
Presents with acute blood loss anemia, dehydration, chronic kidney disease stage iii.   Patient's Creatinine's are ranging from 2.19 on date of admit to 1.38 this morning  This is a decrease in Creatinine of 0.81 over a two day period.  Please clarify if a more acute condition exists if clinically significant and document findings in next progress note and discharge summary.  Acute Renal Failure/Acute Kidney Injury Acute on Chronic Renal Failure Chronic Renal Failure Other Condition    Thank You, Zoila Shutter ,RN Clinical Documentation Specialist:  [phone] ; Cell [phone] [place] Information Management

## 2013-04-01 NOTE — Progress Notes (Addendum)
Bilateral carotid artery duplex completed.  Left:  60-79% internal carotid artery stenosis.  Right:  40-59% internal carotid artery stenosis.  Bilateral:  Vertebral artery flow is antegrade.

## 2013-04-01 NOTE — Progress Notes (Addendum)
Clinical Social Work Department CLINICAL SOCIAL WORK PLACEMENT NOTE 04/01/2013  Patient:  Ricardo Allen, Ricardo Allen  Account Number:  [id number] Admit date:  03/30/2013  Clinical Social Worker:  Berton Mount, Latanya Presser  Date/time:  04/01/2013 11:00 AM  Clinical Social Work is seeking post-discharge placement for this patient at the following level of care:   [place]   (*CSW will update this form in Epic as items are completed)   04/01/2013  Patient/family provided with [place] Department of Clinical Social Work's list of facilities offering this level of care within the geographic area requested by the patient (or if unable, by the patient's family).  04/01/2013  Patient/family informed of their freedom to choose among providers that offer the needed level of care, that participate in Medicare, Medicaid or managed care program needed by the patient, have an available bed and are willing to accept the patient.  04/01/2013  Patient/family informed of [facility]' ownership interest in [facility], as well as of the fact that they are under no obligation to receive care at this facility.  PASARR submitted to E[employer] on Existing PASARR number received from E[employer] on   FL2 transmitted to all facilities in geographic area requested by pt/family on  04/01/2013 FL2 transmitted to all facilities within larger geographic area on   Patient informed that his/her managed care company has contracts with or will negotiate with  certain facilities, including the following:     Patient/family informed of bed offers received:  04/01/2013 Patient chooses bed at [facility] Physician recommends and patient chooses bed at    Patient to be transferred to [facility] on   Patient to be transferred to facility by   The following physician request were entered in Epic:   Additional Comments:   [place], [place]

## 2013-04-01 NOTE — Progress Notes (Signed)
Clinical Social Work Department BRIEF PSYCHOSOCIAL ASSESSMENT 04/01/2013  Patient:  Ricardo Allen, Ricardo Allen     Account Number:  [id number]     Admit date:  03/30/2013  Clinical Social Worker:  Adair Laundry  Date/Time:  04/01/2013 11:00 AM  Referred by:  Physician  Date Referred:  04/01/2013 Referred for  SNF Placement   Other Referral:   Interview type:  Patient Other interview type:   Spoke with pt and pt wife over the phone    PSYCHOSOCIAL DATA Living Status:  FACILITY Admitted from facility:  [facility] Level of care:  Independent Living Primary support name:  Wyn Nettle [phone] Primary support relationship to patient:  SPOUSE Degree of support available:   Pt has supportive family    CURRENT CONCERNS Current Concerns  Post-Acute Placement   Other Concerns:    SOCIAL WORK ASSESSMENT / PLAN CSW spoke with pt at bedside about PT recommendation. Pt expressed hesitation towards going to a facility. Pt reports he was previously at a facility for rehab and "it was horrible". Pt asking if he could return to [place] and have someone come there for rehab. CSW explained the difference between SNF and HH. Pt was understanding. CSW explained SNF referal process and that pt did not necessarily have to return to the facility he was previously at.  Pt [place] agreeable [place] and will take today to consider SNF further. CSW also spoke with pt wife over the phone. Pt wife expressed same concerns and that pt had excellent rehab at previous facility but "horrible nursing care". Pt wife has placed a few calls to see if anything can be done to ensure nursing care would be better at the previous facility because that would be their first choice other than this issue. Pt wife also open to looking at other facilities.   Assessment/plan status:  Psychosocial Support/Ongoing Assessment of Needs Other assessment/ plan:   Information/referral to  community resources:   SNF list to be provided with bed offers    PATIENT'S/FAMILY'S RESPONSE TO PLAN OF CARE: Pt is hesitant towards SNF but agreeable to referal being made       [place], Horace

## 2013-04-02 ENCOUNTER — Encounter (HOSPITAL_COMMUNITY): Payer: Self-pay | Admitting: Physician Assistant

## 2013-04-02 ENCOUNTER — Inpatient Hospital Stay (HOSPITAL_COMMUNITY): Payer: Medicare Other

## 2013-04-02 DIAGNOSIS — D649 Anemia, unspecified: Secondary | ICD-10-CM

## 2013-04-02 DIAGNOSIS — Z7901 Long term (current) use of anticoagulants: Secondary | ICD-10-CM

## 2013-04-02 DIAGNOSIS — M12559 Traumatic arthropathy, unspecified hip: Secondary | ICD-10-CM

## 2013-04-02 LAB — CBC
HCT: 18.5 % — ABNORMAL LOW (ref 39.0–52.0)
HEMOGLOBIN: 6.2 g/dL — AB (ref 13.0–17.0)
MCH: 31.2 pg (ref 26.0–34.0)
MCHC: 33.5 g/dL (ref 30.0–36.0)
MCV: 93 fL (ref 78.0–100.0)
Platelets: 184 10*3/uL (ref 150–400)
RBC: 1.99 MIL/uL — AB (ref 4.22–5.81)
RDW: 15.1 % (ref 11.5–15.5)
WBC: 8.2 10*3/uL (ref 4.0–10.5)

## 2013-04-02 LAB — PROTIME-INR
INR: 1.52 — ABNORMAL HIGH (ref 0.00–1.49)
PROTHROMBIN TIME: 17.9 s — AB (ref 11.6–15.2)

## 2013-04-02 LAB — BASIC METABOLIC PANEL
BUN: 22 mg/dL (ref 6–23)
CO2: 23 meq/L (ref 19–32)
Calcium: 8.5 mg/dL (ref 8.4–10.5)
Chloride: 103 mEq/L (ref 96–112)
Creatinine, Ser: 1.28 mg/dL (ref 0.50–1.35)
GFR calc Af Amer: 57 mL/min — ABNORMAL LOW (ref >90)
GFR, EST NON AFRICAN AMERICAN: 49 mL/min — AB (ref >90)
GLUCOSE: 95 mg/dL (ref 70–99)
Potassium: 4.4 mEq/L (ref 3.7–5.3)
SODIUM: 137 meq/L (ref 137–147)

## 2013-04-02 LAB — MRSA PCR SCREENING: MRSA by PCR: NEGATIVE

## 2013-04-02 MED ORDER — LORAZEPAM 2 MG/ML IJ SOLN
2.0000 mg | INTRAMUSCULAR | Status: DC | PRN
  Administered 2013-04-02: 2 mg via INTRAVENOUS
  Filled 2013-04-02: qty 1

## 2013-04-02 MED ORDER — SENNA 8.6 MG PO TABS
1.0000 | ORAL_TABLET | Freq: Every day | ORAL | Status: DC
  Administered 2013-04-03: 8.6 mg via ORAL
  Filled 2013-04-02 (×5): qty 1

## 2013-04-02 MED ORDER — POLYETHYLENE GLYCOL 3350 17 G PO PACK
17.0000 g | PACK | Freq: Two times a day (BID) | ORAL | Status: DC
  Administered 2013-04-02 – 2013-04-04 (×3): 17 g via ORAL
  Filled 2013-04-02 (×9): qty 1

## 2013-04-02 MED ORDER — ADULT MULTIVITAMIN W/MINERALS CH
1.0000 | ORAL_TABLET | Freq: Every day | ORAL | Status: DC
  Administered 2013-04-03 – 2013-04-06 (×4): 1 via ORAL
  Filled 2013-04-02 (×4): qty 1

## 2013-04-02 MED ORDER — FOLIC ACID 1 MG PO TABS
1.0000 mg | ORAL_TABLET | Freq: Every day | ORAL | Status: DC
  Administered 2013-04-03 – 2013-04-06 (×4): 1 mg via ORAL
  Filled 2013-04-02 (×4): qty 1

## 2013-04-02 MED ORDER — M.V.I. ADULT IV INJ
INJECTION | Freq: Once | INTRAVENOUS | Status: AC
  Administered 2013-04-02: 16:00:00 via INTRAVENOUS
  Filled 2013-04-02 (×2): qty 1000

## 2013-04-02 MED ORDER — VITAMIN B-1 100 MG PO TABS
100.0000 mg | ORAL_TABLET | Freq: Every day | ORAL | Status: DC
  Administered 2013-04-03 – 2013-04-06 (×4): 100 mg via ORAL
  Filled 2013-04-02 (×4): qty 1

## 2013-04-02 NOTE — Progress Notes (Signed)
[facility] Consult: 12:36 PM 04/02/2013  LOS: 3 days    Referring Provider: Dr Ardeth Perfect  Primary Care Physician:  Velna Hatchet, MD Primary Gastroenterologist:  Dr.Kaplan  Hematologist :  Dr Lamonte Sakai     Reason for Consultation:  Anemia.    HPI: Ricardo Allen is a 80 y.o. male.  Has OSA, hx of anemia of chronic disease, chronic Coumadin for A fib. follwed by pin specialist Dr Hardin Negus for narcotic mgt of spinal stenosis related pain in right glut/hip area.    S/p colon/egd in 06/2012 for chronic anemia and FOBT + stool.  Showed diverticulosis but no real source of anemia.  Capsule endoscopy suggested if recurrent anemia, FOBT + but was not pursued.  Treated with PO iron , vitamin b6,   Hematologist in 06/2012 found little evidence of myeloma.  Felt the anemia was from CKD, Bone marrow biospy neg for MDS, aplasia.  Considered Aranesp in future if requiring frequent transfusion.  Iron supplements discontinued 01/2013 due to iron overload (decision per Dr Heath Lark and cancer center)  Hgb 10 in early 03/2012.  Seen in ED 3/16 for a fall.  Had had a vicodin, 2 gin/tonics and glass of wine at lunch leading up to the fall which was associated with AMS/?syncope.  Hgb was 8.9.  He was sent home.   Seen by PMD and admitted 3/17 with weakness. Hgb was 7.0 on  3/17. PRBCs x 2 on 3/17 with HGB up to 8.2 on 3/18.  Continued to drift from 7.5 to 7.4 to 6.2 today and 2 more units transfused.   No gross bleeding (no melena, BPR, hematemesis).  In fact he tends to constipation due to chronic narcotics (baseline 2 to 3 stools weekly, prn Miralax) and no BM for one week.  CT abdomen 3/17 and thigh 3/19 confirm large hematoma in right thigh.  Dr Alvy Bimler (heme) suspects hematoma to be source of acute anemia. Does not recommend  erythropoetin which confers risk of thrombotic complications in pt with limited mobility.   Dr Ardeth Perfect alarmed by persistent drop of Hgb despite the transfusions of 3/17, confusion.   Pt having repeat CT abdomen and Pelvis today.  By my rectal exam he is FOB negative.  No nausea or abd pain.  No blood in urine.  No dysphagia.  No  ETOH averages 2 cocktails and 1 to 2 glasses of wine daily about 4 days per week.    Past Medical History  Diagnosis Date  . Atrial fibrillation 11/27/2009  . DEPRESSION 08/27/2006  . DISORDER, LUMBAR DISC W/MYELOPATHY 08/19/2006  . GOUT 03/22/2009  . HYPERLIPIDEMIA 08/27/2006  . HYPOTENSION, ORTHOSTATIC 10/31/2009  . Impaired fasting glucose 01/27/2007  . LOW BACK PAIN 02/03/2007    Lumbar radiculopathy 09/2006  . LUNG NODULE 03/29/2008  . MELANOMA, MALIGNANT, TRUNK 08/19/2006    early stage; just exicion.   . OBSTRUCTIVE SLEEP APNEA 08/19/2006  . TRAUMATIC ARTHROPATHY PELVIC REGION AND THIGH 06/29/2009  . TOBACCO ABUSE 12/31/2007  . Wet senile macular degeneration   . Diverticulosis   . Anemia  of other chronic disease 2008    EGD and colonoscopy in 2014 with diverticulosis only  . 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 Cataract extraction    . Lumbar laminectomy    . Lumbar fusion    . Tonsilectomy, adenoidectomy, bilateral myringotomy and tubes    . Melanoma excision      left lower back  . Esophagogastroduodenoscopy N/A 06/20/2012    Procedure: ESOPHAGOGASTRODUODENOSCOPY (EGD);  Surgeon: Inda Castle, MD;  Location: [facility];  Service: Endoscopy;  Laterality: N/A;  . Colonoscopy N/A 06/21/2012    Procedure: COLONOSCOPY;  Surgeon: Inda Castle, MD;  Location: [facility];  Service: Endoscopy;  Laterality: N/A;    Prior to Admission medications   Medication Sig Start Date End Date Taking? Authorizing Provider  allopurinol (ZYLOPRIM) 100 MG tablet Take 1 tablet (100 mg total) by mouth  daily. 06/23/12  Yes Velna Hatchet, MD  amiodarone (PACERONE) 200 MG tablet Take 1 tablet (200 mg total) by mouth daily. 06/23/12  Yes Velna Hatchet, MD  citalopram (CELEXA) 20 MG tablet Take 1 tablet (20 mg total) by mouth daily. 06/23/12  Yes Velna Hatchet, MD  Cobalamine Combinations (B-6 FOLIC ACID) 810-1751-02 MCG-MCG-MG CAPS Take 1 capsule by mouth daily. 06/22/10  Yes Ricard Dillon, MD  docusate sodium 100 MG CAPS Take 100 mg by mouth 2 (two) times daily. 06/23/12  Yes Velna Hatchet, MD  ferrous sulfate (FERROUSUL) 325 (65 FE) MG tablet Take 1 tablet (325 mg total) by mouth daily with breakfast. 06/23/12  Yes Velna Hatchet, MD  HYDROcodone-acetaminophen (NORCO) 10-325 MG per tablet Take 1 tablet by mouth every 6 (six) hours as needed for pain.   Yes Historical Provider, MD  metoprolol tartrate (LOPRESSOR) 25 MG tablet Take 1 tablet (25 mg total) by mouth 2 (two) times daily. 06/23/12  Yes Velna Hatchet, MD  morphine (MS CONTIN) 30 MG 12 hr tablet Take 30 mg by mouth 2 (two) times daily.     Yes Historical Provider, MD  oxyCODONE-acetaminophen (PERCOCET) 5-325 MG per tablet Take 1-2 tablets by mouth every 4 (four) hours as needed for severe pain. 03/29/13  Yes Julianne Rice, MD  pantoprazole (PROTONIX) 40 MG tablet Take 1 tablet (40 mg total) by mouth daily at 6 (six) AM. 06/23/12  Yes Velna Hatchet, MD  Probiotic Product (PROBIOTIC DAILY PO) Take 1 capsule by mouth daily.    Yes Historical Provider, MD  warfarin (COUMADIN) 2 MG tablet Take 2 tablets by mouth daily. 07/29/12  Yes Historical Provider, MD  alendronate (FOSAMAX) 70 MG tablet Take 70 mg by mouth every Sunday. Take with a full glass of water on an empty stomach.    Historical Provider, MD  colchicine 0.6 MG tablet Take 0.6 mg by mouth daily as needed (for gout).    Historical Provider, MD  polyethylene glycol (MIRALAX / GLYCOLAX) packet Take 17 g by mouth daily as needed (constipation).    Historical Provider, MD    Scheduled  Meds: . allopurinol  100 mg Oral Daily  . amiodarone  200 mg Oral Daily  . bisacodyl  5 mg Oral q morning - 10a  . citalopram  20 mg Oral Daily  . docusate sodium  100 mg Oral BID  . [START ON 5/85/2778] folic acid  1 mg Oral Daily  . metoprolol tartrate  25 mg Oral BID  . morphine  30 mg Oral Q12H  . [  START ON 04/03/2013] multivitamin with minerals  1 tablet Oral Daily  . pantoprazole  40 mg Oral Q0600  . polyethylene glycol  17 g Oral Daily  . banana bag IV 1000 mL   Intravenous Once  . sodium chloride  3 mL Intravenous Q12H  . [START ON 04/03/2013] thiamine  100 mg Oral Daily   Infusions:   PRN Meds: ondansetron (ZOFRAN) IV, ondansetron, oxyCODONE-acetaminophen   Allergies as of 03/30/2013 - Review Complete 03/30/2013  Allergen Reaction Noted  . Fentanyl  03/08/2009  . Hydromorphone hcl  03/08/2009  . Sulfonamide derivatives      Family History  Problem Relation Age of Onset  . Pancreatic cancer Mother   . Cancer Mother     pancreas  . Testicular cancer Father   . Cancer Father     testicular  . Arthritis Brother   . Cancer Maternal Aunt     head/neck cancer    History   Social History  . Marital Status: Married    Spouse Name: N/A    Number of Children: 0  . Years of Education: N/A   Occupational History  .      interior designer/architect   Social History Main Topics  . Smoking status: Former Smoker -- 2.00 packs/day for 60 years    Types: Cigarettes    Quit date: 04/14/2008  . Smokeless tobacco: Never Used  . Alcohol Use: 4.4 oz/week    4 Glasses of wine, 4 Drinks containing 0.5 oz of alcohol per week  . Drug Use: No  . Sexual Activity: No   Other Topics Concern  . Not on file   Social History Narrative  . Lives at [place]: Constitutional:  No weight loss.  ENT:  No nose bleeds Pulm:  No SOB, cough CV:  No palpitations, no LE edema.  GU:  No hematuria, no frequency GI:  Per HPI.   Heme:  Per HPI    Transfusions:  Per HPI Neuro:  No headaches, no peripheral tingling or numbness Derm:  No itching, no rash or sores.  Endocrine:  No sweats or chills.  No polyuria or dysuria Immunization:  Not queried.  Travel:  None beyond local counties in last few months.    PHYSICAL EXAM: Vital signs in last 24 hours: Filed Vitals:   04/02/13 1212  BP: 159/67  Pulse:   Temp: 98.9 F (37.2 C)  Resp:    Wt Readings from Last 3 Encounters:  04/02/13 82.146 kg (181 lb 1.6 oz)  03/29/13 78.472 kg (173 lb)  03/28/13 78.926 kg (174 lb)   General: pale looking WM.  Not obese Head:  No assymetry or swelling   Eyes:  No icterus, no pallor, no injected sclera Ears:  Not HOH  Nose:  No congestion, no discharge Mouth:  Clear, moist, no lesion, no blood.  No teeth Neck:  Bruising on the right neck Lungs:  Clear bil  No cough or dyspnea Heart: RRR.  No MRG Abdomen:  Soft, NT, ND.  No mass.  Large hematoma on right lower to mid flank. .   Rectal: impacted formed brown stool is FOBT negative.   GU:  Scrotal hematoma.  Musc/Skeltl: tight, tender right thigh, only small bruise on lateral upper thigh/glute and larger bruise on medial rear of the right thigh.   Extremities:  No pedal edema.  Both feet warm and well perfused  Neurologic:  Oriented x 3, no tremor.  Moves all  4 limbs Skin:  No rash or sores Tattoos:  none Nodes:  No cervical adenopathy   Psych:  Appropriate, good historian, some work finding aphasia but generally fluent and accurate speach  Intake/Output from previous day: 03/19 0701 - 03/20 0700 In: 400 [P.O.:400] Out: 650 [Urine:650] Intake/Output this shift: Total I/O In: 325.5 [I.V.:300; Blood:25.5] Out: -   LAB RESULTS:  Recent Labs  04/01/13 0923 04/01/13 1645 04/02/13 0550  WBC 8.9 10.3 8.2  HGB 7.5* 7.4* 6.2*  HCT 22.6* 22.0* 18.5*  PLT 176 187 184   BMET Lab Results  Component Value Date   NA 137 04/02/2013   NA 137 04/01/2013   NA 135* 03/31/2013   K 4.4  04/02/2013   K 4.5 04/01/2013   K 5.1 03/31/2013   CL 103 04/02/2013   CL 102 04/01/2013   CL 101 03/31/2013   CO2 23 04/02/2013   CO2 22 04/01/2013   CO2 24 03/31/2013   GLUCOSE 95 04/02/2013   GLUCOSE 96 04/01/2013   GLUCOSE 115* 03/31/2013   BUN 22 04/02/2013   BUN 25* 04/01/2013   BUN 33* 03/31/2013   CREATININE 1.28 04/02/2013   CREATININE 1.38* 04/01/2013   CREATININE 1.82* 03/31/2013   CALCIUM 8.5 04/02/2013   CALCIUM 8.3* 04/01/2013   CALCIUM 8.3* 03/31/2013   LFT No results found for this basename: PROT, ALBUMIN, AST, ALT, ALKPHOS, BILITOT, BILIDIR, IBILI,  in the last 72 hours PT/INR Lab Results  Component Value Date   INR 1.52* 04/02/2013   INR 2.42* 04/01/2013   INR 2.12* 03/30/2013     RADIOLOGY STUDIES: Mr Femur Right Wo Contrast 04/01/2013     FINDINGS: There is a 13.5 x 10.0 x 7.0 cm intramuscular hematoma in the proximal right vastus lateralis muscle. There is edema in the periphery of the muscle due to the hematoma. There is a mass effect upon the adjacent muscles. The patient has some edema in the vastus intermedius and vastus medialis muscles due to the adjacent hematoma.  There is no underlying acute abnormality of the right femur. Right proximal femoral prosthesis is noted. There is circumferential subcutaneous edema in the proximal right thigh.  IMPRESSION: Large intramuscular hematoma in the proximal right vastus lateralis muscle with secondary edema in the muscles and subcutaneous soft tissues. This is to the expected degree considering the presence the hematoma. No evidence of muscle necrosis.   Electronically Signed   By: Rozetta Nunnery M.D.   On: 04/01/2013 09:16   CT ABDOMEN AND PELVIS WITH CONTRAST 03/30/2013 FINDINGS:  Liver normal. Spleen normal. Pancreas normal. No biliary distention.  Gallbladder is unremarkable. Portal and splenic vein patent.  Adrenals normal. Simple right renal cyst. Kidneys are otherwise  unremarkable. No hydronephrosis or evidence of obstructing  ureteral  stone. Bladder is moderately distended. The prostate is not  enlarged.  No significant adenopathy. Abdominal aorta normal in caliber. Aorto  iliac and visceral vascular disease present. The visceral vessels  are patent. No aneurysm. No retroperitoneal hemorrhage.  Appendix normal. No inflammatory change right or left lower  quadrant. Stool present throughout the colon. No bowel distention.  Stomach is nondistended. No free air. No mesenteric mass.  Heart size normal. Coronary artery disease. Bilateral pleural  thickening with calcified pleural plaques are present. Prior  asbestos exposure could present in this fashion. Tiny umbilical  hernia and small bilateral inguinal hernias with herniation of fat  only. Prominent right upper anterior thigh soft tissue swelling is  noted . Hemorrhage in this  region cannot be excluded. Subcutaneous  edema is also present is region. .  IMPRESSION:  1. Prominent right upper thigh soft tissue muscular soft tissue  swelling and possible hemorrhage. No evidence of retroperitoneal  hemorrhage. No associated fracture. The patient has a right hip  replacement.  2. Bilateral calcified pleural plaques suggesting prior asbestos  exposure.  3. Coronary artery disease.    ENDOSCOPIC STUDIES: 06/2012  Colonoscopy For Anemia, non-specific and heme-positive stool. ENDOSCOPIC IMPRESSION:  1. Moderate diverticulosis was noted in the sigmoid colon  2. The colon mucosa was otherwise normal  RECOMMENDATIONS:  f/u hemeoccults in 1 week; if positive proceed with capsule  endoscopy  06/2012   EGD Normal EGD  2006  Colonoscopy Screening study for + FHx of polyps Normal study.     IMPRESSION:   *  Acute anemia, presumed due to blood loss.  Hx of anemia of chronic disease followed by hematologist Dr Alvy Bimler.   No gross GI bleeding evidence.  Large right thigh hematoma (result of fall on 3/16) on imaging.  Extensive hematoma involving scrotum, flank,  neck and thigh which is quite tense and tender due to hematoma.   Concur with Dr Alvy Bimler that source for anemia is the hematoma.    *  Hx FOBT +, chronic anemia workup in 2014.  Study was negative except for diverticulosis.  FOBT negative currently and constipated for one week.  *  Chronic right hip pain , chronic narcotics  *  AMS, may be s/e of narcotics.  He was well oriented and appropriate for me today.   *  Atrial fib. Coumadin on hold.  Max INR 2.4 on 3/19, today it is 1.5.  Generally coags in therapeutic range.   *  OSA    PLAN:     *  Per Dr Hilarie Fredrickson.  *  Increase Miralax to BID and add Bisacodyl for constipation.  *  Ok to keep feeding pt as doing   Azucena Freed  04/02/2013, 12:36 PM Pager: [phone]

## 2013-04-02 NOTE — Progress Notes (Signed)
I was routinely checking vital signs with blood administration when pt clearly hallucinating speaking of pills in his hand as well as thread pieces. He stated he was in administration offices in the hospital. Vital signs stable. Neuro checks done and assessment benign. Dr. Ardeth Perfect happened to make rounds at this time and he was concerned for bleeding source. He wanted pt to be moved to stepdown. Orders placed. MD spoke with pts wife and I spoke with pts daughter. Will follow through with orders and closely monitor

## 2013-04-02 NOTE — Progress Notes (Addendum)
Physician Daily Progress Note  Subjective: Getting blood this AM  No complaints of nausea, abd pain, new bruising, hematuria No BM since Sunday Is having mild hallucinations this AM, without focal neuro deficits    Objective: Vital signs in last 24 hours: Temp:  [97.5 F (36.4 C)-98.9 F (37.2 C)] 98.3 F (36.8 C) (03/20 1115) Pulse Rate:  [79-88] 87 (03/20 1115) Resp:  [18-19] 18 (03/20 1115) BP: (108-149)/(40-93) 140/58 mmHg (03/20 1115) SpO2:  [98 %-100 %] 99 % (03/20 1115) Weight:  [82.146 kg (181 lb 1.6 oz)] 82.146 kg (181 lb 1.6 oz) (03/20 0533) Weight change: -2.631 kg (-5 lb 12.8 oz) Last BM Date: 03/28/13  CBG (last 3)  No results found for this basename: GLUCAP,  in the last 72 hours  Intake/Output from previous day:  Intake/Output Summary (Last 24 hours) at 04/02/13 1120 Last data filed at 04/02/13 0900  Gross per 24 hour  Intake  542.5 ml  Output    350 ml  Net  192.5 ml   03/19 0701 - 03/20 0700 In: 400 [P.O.:400] Out: 650 [Urine:650]  Physical Exam General appearance: WM in NAD  Eyes: no scleral icterus Throat: oropharynx moist without erythema Resp: CTAB, no wheezing/rales  Cardio: RRR, no MRG  GI: soft, non-tender; bowel sounds normal; no masses,  no organomegaly Extremities: edema of R thigh and knee w/o ecchymosis  Skin: ecchymosis on R neck and R flank/hip   Lab Results:  Recent Labs  04/01/13 0923 04/02/13 0550  NA 137 137  K 4.5 4.4  CL 102 103  CO2 22 23  GLUCOSE 96 95  BUN 25* 22  CREATININE 1.38* 1.28  CALCIUM 8.3* 8.5    No results found for this basename: AST, ALT, ALKPHOS, BILITOT, PROT, ALBUMIN,  in the last 72 hours   Recent Labs  03/30/13 1901  04/01/13 1645 04/02/13 0550  WBC 14.2*  < > 10.3 8.2  NEUTROABS 10.6*  --   --   --   HGB 7.0*  < > 7.4* 6.2*  HCT 21.3*  < > 22.0* 18.5*  MCV 95.1  < > 93.6 93.0  PLT 204  < > 187 184  < > = values in this interval not displayed.  Lab Results  Component Value Date    INR 1.52* 04/02/2013   INR 2.42* 04/01/2013   INR 2.12* 03/30/2013    No results found for this basename: CKTOTAL, CKMB, CKMBINDEX, TROPONINI,  in the last 72 hours  No results found for this basename: TSH, T4TOTAL, FREET3, T3FREE, THYROIDAB,  in the last 72 hours   Recent Labs  03/30/13 1901  FERRITIN 511*  TIBC 200*  IRON 15*    Micro Results: Recent Results (from the past 240 hour(s))  URINE CULTURE     Status: None   Collection Time    03/31/13  1:02 PM      Result Value Ref Range Status   Specimen Description URINE, CLEAN CATCH   Final   Special Requests NONE   Final   Culture  Setup Time     Final   Value: 03/31/2013 17:14     Performed at [facility]     Final   Value: 70,000 COLONIES/ML     Performed at [facility]   Culture     Final   Value: Multiple bacterial morphotypes present, none predominant. Suggest appropriate recollection if clinically indicated.     Performed at [facility]  Report Status 04/01/2013 FINAL   Final    Studies/Results: Mr Femur Right Wo Contrast  04/01/2013   CLINICAL DATA:  Right leg pain and swelling secondary to a fall. Arch proximal right anterior thigh hematoma.  EXAM: MRI OF THE RIGHT FEMUR WITHOUT CONTRAST  TECHNIQUE: Multiplanar, multisequence MR imaging was performed. No intravenous contrast was administered.  COMPARISON:  CT scan dated 03/30/2013 and radiographs dated 03/29/2013  FINDINGS: There is a 13.5 x 10.0 x 7.0 cm intramuscular hematoma in the proximal right vastus lateralis muscle. There is edema in the periphery of the muscle due to the hematoma. There is a mass effect upon the adjacent muscles. The patient has some edema in the vastus intermedius and vastus medialis muscles due to the adjacent hematoma.  There is no underlying acute abnormality of the right femur. Right proximal femoral prosthesis is noted. There is circumferential subcutaneous edema in the proximal right thigh.   IMPRESSION: Large intramuscular hematoma in the proximal right vastus lateralis muscle with secondary edema in the muscles and subcutaneous soft tissues. This is to the expected degree considering the presence the hematoma. No evidence of muscle necrosis.   Electronically Signed   By: Rozetta Nunnery M.D.   On: 04/01/2013 09:16     Medications: Scheduled: . allopurinol  100 mg Oral Daily  . amiodarone  200 mg Oral Daily  . bisacodyl  5 mg Oral q morning - 10a  . citalopram  20 mg Oral Daily  . docusate sodium  100 mg Oral BID  . metoprolol tartrate  25 mg Oral BID  . morphine  30 mg Oral Q12H  . pantoprazole  40 mg Oral Q0600  . polyethylene glycol  17 g Oral Daily  . sodium chloride  3 mL Intravenous Q12H   Continuous:    Assessment/Plan: 1. Acute on chronic anemia of chronic disease 2/2 acute blood loss - -  Hgb 6.2 today - S/p pRBC xfusion on 3/17, xfuse another 2U today (3/20) - etiology is unclear at this point. Will consult GI to consider a capsule endoscopy, given EGD/colon were normal in June. However, no clinical signs of GI loss. Pt has low TIBC w/ high ferritin...   - hematoma stable. CT A/P neg for retroperitoneal bleed.  - heme does not feel this has to do w/ AoCD and will see patient in office on 3/25 to recheck CBC and xfuse if needed  - not currently good candidate for arenesp treatment given DVT risk in immobile patient   # falls - seeing Dr Einar Gip on 3/26 at 1145 to discuss whether continuing coumadin would be rec'd in the setting of his falls and large hematoma. Ihave stopped coumadin at this point given continued blood loss.  PT recs SNF for rehab   # alcohol abuse - instituting withdrawal monitoring protocol  2. Knee edema after fall -   Placing ice pack to sight for swelling . Reviewed Xray of R femur w/o fracture . Placing in rehab for therapy of knee trauma.   # leukocytosis - resolved . Neg u/a  # syncope - CUS today shows no progression of his carotid  stenosis. Syncope likely 2/2 drug effect and dehydration (drinks etoh while on narcotics and does not drink water)  # acute on chronic renal failure - resolved w/ IVF and PRBC transfusion  3. Constipation - dulcolax, miralax, colace . Still no BM . Will see if GI plans on cleaning him out w/ bowel prep. Remains asymptomatic  4. afib -  rate controlled. Home meds. Resumed coumadin. INR pending today   5. Chronic pain control - on home meds   6. Dispo - PT recs SNF, I agree needs rehab. [facility] has been arranged. D/C unlikely this weekend. continued anemia is delaying this discharge     LOS: 3 days   Ricardo Allen, [place] 04/02/2013, 11:20 AM

## 2013-04-02 NOTE — Progress Notes (Addendum)
CSW (Clinical Education officer, museum) informed dc likely not today. CSW called pt wife and she was aware. CSW asked for pt to wife to please still plan on completing paperwork at facility today. Pt wife said this would be just fine. CSW called and left voicemail with facility to notify. Facility typically requires dc paperwork on Friday afternoon if pt will be discharging over weekend. CSW will speak with pt nurse or MD to see if this will be possible.  CSW also called and left voicemail for insurance to notify. CSW will inquire how long authorization is valid.  [place], [place]

## 2013-04-02 NOTE — Progress Notes (Signed)
PT Cancellation Note  Patient Details Name: Ricardo Allen MRN: [id number] DOB: [birth year]   Cancelled Treatment:    Reason Eval/Treat Not Completed: Medical issues which prohibited therapy.  Pt's Hgb 6.2 today.  Receiving blood and transferred to 2S due to increased confusion/need for closer monitoring.  PT to check back tomorrow or Monday as time allows.   Thanks,    Barbarann Ehlers. [place], [place], DPT [phone]   04/02/2013, 3:54 PM

## 2013-04-02 NOTE — Progress Notes (Signed)
Pt arrived from 3W- drop in Hgb and confusing led to tx for closer observation. Ended pRBC's 1 unit ordered of 2nd. VSS no complaints of pain. No signs of active bleeding. Will start next unit of blood and continue to monitor.

## 2013-04-02 NOTE — Progress Notes (Signed)
Pt transferred to [place]. Report given to Jarrett Soho, Therapist, sports. No change in status. First unit of blood finishing up.

## 2013-04-02 NOTE — Consult Note (Signed)
CARDIOLOGY CONSULT NOTE  Patient ID: Ricardo Allen MRN: [id number] DOB/AGE: 78-Oct-1927 78 y.o.  Admit date: 03/30/2013 Referring Physician  Velna Hatchet, MD Primary Physician:  Velna Hatchet, MD Reason for Consultation  Atrial fibrillation and anticoagulation  HPI:   Ricardo Allen is a 80 year old fairly active Caucasian male who has history of atrial fibrillation, chronic back pain and lumbar spinal stenosis, scoliosis, with significant visual disturbance due to macular degeneration, history of sleep apnea on CPAP and prior history of hospitalization to [facility] hospital on 6/60/2014 with decreased hemoglobin, underwent EGD and colonoscopy at that time within normal limits.  He has chronic renal insufficiency and chronic kidney disease.  He is on chronic pain medication, on 03/28/2012, in the afternoon, after a glass of wine and 2 hard liquor and one Vicodin, was in the car and 20 trying to get out he fell and presented to the emergency room and was released back home with small hematoma on the foot.  However he presented back the following day with marked generalized weakness, and eventually admitted with a large right thigh hematoma.  He was on Coumadin and his INR was therapeutic  Due to continued decrease in hemoglobin, his warfarin was held, GI was consulted.  There is consideration for capsule endoscopy.  He has history of abnormal EKG and trifascicular block but has not had any syncope or significant bradycardia.  Her last opine regarding long-term anticoagulation, patient's wife also had called our office for me to evaluate the patient.  Patient states that he is presently doing well and has severe pain in his right thigh and right leg.  He also states that over the past one day he has been having significant hallucinations and confusional state and has unpleasant experiences.  Patient was appropriate with me.  No other specific complaints.  He denied any chest pain, shortness breath,  PND or orthopnea.  No seizure or no headache or worsening visual disturbances.  Patient states that he has been having frequent episodes of falls, at least 2-3 every month and states that he has to be careful with his balance.   Past Medical History  Diagnosis Date  . Atrial fibrillation 11/27/2009  . DEPRESSION 08/27/2006  . DISORDER, LUMBAR DISC W/MYELOPATHY 08/19/2006  . GOUT 03/22/2009  . HYPERLIPIDEMIA 08/27/2006  . HYPOTENSION, ORTHOSTATIC 10/31/2009  . Impaired fasting glucose 01/27/2007  . LOW BACK PAIN 02/03/2007    Lumbar radiculopathy 09/2006  . LUNG NODULE 03/29/2008  . MELANOMA, MALIGNANT, TRUNK 08/19/2006    early stage; just exicion.   . OBSTRUCTIVE SLEEP APNEA 08/19/2006  . TRAUMATIC ARTHROPATHY PELVIC REGION AND THIGH 06/29/2009  . TOBACCO ABUSE 12/31/2007  . Wet senile macular degeneration   . Diverticulosis   . Anemia of other chronic disease 2008    EGD and colonoscopy in 2014 with diverticulosis only  . Complex sleep apnea syndrome 08/19/2006           Past Surgical History  Procedure Laterality Date  . Cataract extraction    . Lumbar laminectomy    . Lumbar fusion    . Tonsilectomy, adenoidectomy, bilateral myringotomy and tubes    . Melanoma excision      left lower back  . Esophagogastroduodenoscopy N/A 06/20/2012    Procedure: ESOPHAGOGASTRODUODENOSCOPY (EGD);  Surgeon: Inda Castle, MD;  Location: [facility];  Service: Endoscopy;  Laterality: N/A;  . Colonoscopy N/A 06/21/2012    Procedure: COLONOSCOPY;  Surgeon: Inda Castle, MD;  Location: [facility];  Service:  Endoscopy;  Laterality: N/A;     Family History  Problem Relation Age of Onset  . Pancreatic cancer Mother   . Cancer Mother     pancreas  . Testicular cancer Father   . Cancer Father     testicular  . Arthritis Brother   . Cancer Maternal Aunt     head/neck cancer     Social History: History   Social History  . Marital Status: Married    Spouse Name: N/A    Number of Children: 0   . Years of Education: N/A   Occupational History  .      interior designer/architect   Social History Main Topics  . Smoking status: Former Smoker -- 2.00 packs/day for 60 years    Types: Cigarettes    Quit date: 04/14/2008  . Smokeless tobacco: Never Used  . Alcohol Use: 4.4 oz/week    4 Glasses of wine, 4 Drinks containing 0.5 oz of alcohol per week  . Drug Use: No  . Sexual Activity: No   Other Topics Concern  . Not on file   Social History Narrative  . No narrative on file     Prescriptions prior to admission  Medication Sig Dispense Refill  . allopurinol (ZYLOPRIM) 100 MG tablet Take 1 tablet (100 mg total) by mouth daily.  30 tablet  5  . amiodarone (PACERONE) 200 MG tablet Take 1 tablet (200 mg total) by mouth daily.  30 tablet  5  . citalopram (CELEXA) 20 MG tablet Take 1 tablet (20 mg total) by mouth daily.  30 tablet  5  . Cobalamine Combinations (B-6 FOLIC ACID) 123456 MCG-MCG-MG CAPS Take 1 capsule by mouth daily.  30 each  11  . docusate sodium 100 MG CAPS Take 100 mg by mouth 2 (two) times daily.  60 capsule  5  . ferrous sulfate (FERROUSUL) 325 (65 FE) MG tablet Take 1 tablet (325 mg total) by mouth daily with breakfast.  30 tablet  5  . HYDROcodone-acetaminophen (NORCO) 10-325 MG per tablet Take 1 tablet by mouth every 6 (six) hours as needed for pain.      . metoprolol tartrate (LOPRESSOR) 25 MG tablet Take 1 tablet (25 mg total) by mouth 2 (two) times daily.  60 tablet  5  . morphine (MS CONTIN) 30 MG 12 hr tablet Take 30 mg by mouth 2 (two) times daily.        Marland Kitchen oxyCODONE-acetaminophen (PERCOCET) 5-325 MG per tablet Take 1-2 tablets by mouth every 4 (four) hours as needed for severe pain.  10 tablet  0  . pantoprazole (PROTONIX) 40 MG tablet Take 1 tablet (40 mg total) by mouth daily at 6 (six) AM.  30 tablet  5  . Probiotic Product (PROBIOTIC DAILY PO) Take 1 capsule by mouth daily.       Marland Kitchen warfarin (COUMADIN) 2 MG tablet Take 2 tablets by mouth daily.       Marland Kitchen alendronate (FOSAMAX) 70 MG tablet Take 70 mg by mouth every Sunday. Take with a full glass of water on an empty stomach.      . colchicine 0.6 MG tablet Take 0.6 mg by mouth daily as needed (for gout).      . polyethylene glycol (MIRALAX / GLYCOLAX) packet Take 17 g by mouth daily as needed (constipation).        Scheduled Meds: . allopurinol  100 mg Oral Daily  . amiodarone  200 mg Oral Daily  . bisacodyl  5 mg Oral q morning - 10a  . citalopram  20 mg Oral Daily  . docusate sodium  100 mg Oral BID  . [START ON 1/61/0960] folic acid  1 mg Oral Daily  . metoprolol tartrate  25 mg Oral BID  . morphine  30 mg Oral Q12H  . [START ON 04/03/2013] multivitamin with minerals  1 tablet Oral Daily  . pantoprazole  40 mg Oral Q0600  . polyethylene glycol  17 g Oral BID  . senna  1 tablet Oral Daily  . sodium chloride  3 mL Intravenous Q12H  . [START ON 04/03/2013] thiamine  100 mg Oral Daily   Continuous Infusions:  PRN Meds:.ondansetron (ZOFRAN) IV, ondansetron, oxyCODONE-acetaminophen  ROS: General: no fevers/chills/night sweats ENT: no sore throat or hearing loss Resp: no cough, wheezing, or hemoptysis CV: no edema or palpitations GI: no abdominal pain, nausea, vomiting, diarrhea, or constipation GU: no dysuria, frequency, or hematuria Skin: no rash Neuro: no headache, numbness, tinmuslgling, or weakness of extremities Endo: no polydipsia or polyuria.      Physical Exam: Blood pressure 135/47, pulse 86, temperature 98.1 F (36.7 C), temperature source Oral, resp. rate 22, height 5\' 11"  (1.803 m), weight 82.146 kg (181 lb 1.6 oz), SpO2 100.00%.   General: Well built and normal body habitus who is in no acute distress. Appears stated age. Alert Ox3.   There is no cyanosis. HEENT: normal limits.  No JVD.   CARDIAC EXAM: S1, S2 normal, no gallop present. No murmur.   CHEST EXAM: No tenderness of chest wall. LUNGS: Clear to percuss and auscultate.  ABDOMEN: No  hepatosplenomegaly. BS normal in all 4 quadrants. Abdomen is non-tender.   EXTREMITY: a very large hematoma in the right thigh is evident, tender and right thigh and right leg is warm compared to the left.  NEUROLOGIC EXAM: Grossly intact without any focal deficits. Alert O x 3.   VASCULAR EXAM: No skin breakdown. Carotids prominent left carotid bruit. Soft right carotid bruit. Extremities: Femoral pulse normal. Popliteal pulse normal ; Pedal pulse normal. Otherwise No prominent pulse felt in the abdomen. No varicose veins.  Labs:   Lab Results  Component Value Date   WBC 8.2 04/02/2013   HGB 6.2* 04/02/2013   HCT 18.5* 04/02/2013   MCV 93.0 04/02/2013   PLT 184 04/02/2013    Recent Labs Lab 03/29/13 0327  04/02/13 0550  NA 139  < > 137  K 5.3  < > 4.4  CL 103  < > 103  CO2 25  < > 23  BUN 29*  < > 22  CREATININE 1.74*  < > 1.28  CALCIUM 8.3*  < > 8.5  PROT 6.1  --   --   BILITOT 0.2*  --   --   ALKPHOS 74  --   --   ALT 11  --   --   AST 23  --   --   GLUCOSE 143*  < > 95  < > = values in this interval not displayed. Lab Results  Component Value Date   CKTOTAL 20 09/17/2009   CKMB 0.9 09/17/2009   TROPONINI <0.30 06/18/2012    Lipid Panel     Component Value Date/Time   CHOL 212* 05/24/2011 1019   TRIG 155.0* 05/24/2011 1019   HDL 53.90 05/24/2011 1019   CHOLHDL 4 05/24/2011 1019   VLDL 31.0 05/24/2011 1019    EKG 03/28/2013: Normal sinus rhythm at rate of 64 bpm, left axis  deviation, left ventricular block.  Right bundle branch block.  No evidence of ischemia.  Office  Echocardiogram 06/29/2012: Normal left ventricle systolic function, ejection fraction 50-55% with grade 1 diastolic dysfunction. Mild mitral annular calcification. Mild tricuspid regurgitation and moderate pulmonary hypertension.    Radiology: Ct Abdomen Pelvis Wo Contrast  04/02/2013   CLINICAL DATA:  Continue blood loss requiring transfusions question retroperitoneal hemorrhage, history atrial  fibrillation, diverticulosis, gout, melanoma, smoking history  EXAM: CT ABDOMEN AND PELVIS WITHOUT CONTRAST  TECHNIQUE: Multidetector CT imaging of the abdomen and pelvis was performed following the standard protocol without intravenous contrast. Sagittal and coronal MPR images reconstructed from axial data set. Oral contrast not administered for this indication.  COMPARISON:  03/30/2013  FINDINGS: Pleural plaques at lung bases, partially calcified question asbestos exposure.  1.8 x 1.7 cm diameter cyst right kidney image 34 stable.  Within limits of a nonenhanced exam no focal abnormalities of the liver, spleen, pancreas, kidneys, or adrenal glands otherwise identified.  Atherosclerotic calcifications aorta and iliac arteries.  No retroperitoneal hemorrhage.  Normal appendix.  Stomach decompressed, wall thickness suboptimally assessed.  Large and small bowel loops unremarkable.  Normal bladder in ureters.  Beam hardening artifacts from right hip prosthesis.  No mass, adenopathy, free fluid or inflammatory process.  Diffuse infiltrative changes identified within soft tissues at the right hip extending into right thigh.  Osseous demineralization with scattered degenerative disc and facet disease changes of the lumbar spine.  Question right inguinal hernia containing fat.  IMPRESSION: Question asbestos exposure and right inguinal hernia.  No evidence of retroperitoneal hemorrhage.  Diffuse infiltrative changes within soft tissues of the right hip region extending into the right thigh beyond extent of this exam; differential diagnosis includes inflammatory processes, hemorrhage, trauma, deep vein thrombosis, dependent positioning, and other etiologies.   Electronically Signed   By: Lavonia Dana M.D.   On: 04/02/2013 18:43   Mr Femur Right Wo Contrast  04/01/2013   CLINICAL DATA:  Right leg pain and swelling secondary to a fall. Arch proximal right anterior thigh hematoma.  EXAM: MRI OF THE RIGHT FEMUR WITHOUT  CONTRAST  TECHNIQUE: Multiplanar, multisequence MR imaging was performed. No intravenous contrast was administered.  COMPARISON:  CT scan dated 03/30/2013 and radiographs dated 03/29/2013  FINDINGS: There is a 13.5 x 10.0 x 7.0 cm intramuscular hematoma in the proximal right vastus lateralis muscle. There is edema in the periphery of the muscle due to the hematoma. There is a mass effect upon the adjacent muscles. The patient has some edema in the vastus intermedius and vastus medialis muscles due to the adjacent hematoma.  There is no underlying acute abnormality of the right femur. Right proximal femoral prosthesis is noted. There is circumferential subcutaneous edema in the proximal right thigh.  IMPRESSION: Large intramuscular hematoma in the proximal right vastus lateralis muscle with secondary edema in the muscles and subcutaneous soft tissues. This is to the expected degree considering the presence the hematoma. No evidence of muscle necrosis.   Electronically Signed   By: Rozetta Nunnery M.D.   On: 04/01/2013 09:16   ASSESSMENT AND PLAN:  1.  Paroxysmal atrial fibrillation, on chronic Coumadin therapy. 2.  History of excessive alcohol intake, history of frequent falls especially more evident over the past 2-3 months. 3.  Chronic kidney disease 4.  History of obstructive sleep apnea on CPAP 5.  Anemia of chronic disease, anemia due to blood loss, suspect his drop in hemoglobin is due to bleeding in his right  thigh secondary to fall.  Recommendation: I agree with Dr. Ardeth Perfect that Coumadin is not appropriate for this patient with frequent falls, history of alcohol intoxication.  At this point would not recommend aspirin or any other antiplatelet agent as there is no data supporting aspirin use in atrial fibrillation.  I do not have any reservation regarding holding off on Coumadin therapy.  Otherwise continue his present medications and further evaluation.  I suspect his anemia is probably due to right  thigh hematoma. Presently maintaining sinus rhythm on low-dose of amiodarone, continue the same.  I'll see him back in the office after his discharge for follow-up.  Ricardo Page, MD 04/02/2013, 7:50 PM [facility] [place] Pager: [phone] Office: [phone] If no answer Cell [phone]

## 2013-04-02 NOTE — Progress Notes (Signed)
Patient seen, examined, and I agree with the above documentation, including the assessment and plan. Hx of anemia of chronic disease, now more acute.  No evidence of GI bleeding at present.  Stool is brown and heme negative Large right thigh hematoma is felt to explain recent drop in Hgb No plans for repeat EGD or video capsule at this time Agree with increase in laxatives for constipation GI team will be available, please call with questions

## 2013-04-02 NOTE — Progress Notes (Signed)
CRITICAL VALUE ALERT  Critical value received:  Hg 6.2  Date of notification:  04/02/13  Time of notification:  0702  Critical value read back:yes   Nurse who received alert:  Raj Janus  MD notified (1st page):  Dr. Reynaldo Minium  Time of first page:  0702  MD notified (2nd page):  Time of second page:  Responding MD:  Awaiting call back  Time MD responded:  Dayshift RN notified.

## 2013-04-03 ENCOUNTER — Inpatient Hospital Stay (HOSPITAL_COMMUNITY): Payer: Medicare Other

## 2013-04-03 ENCOUNTER — Encounter (HOSPITAL_COMMUNITY): Payer: Self-pay | Admitting: Radiology

## 2013-04-03 LAB — TYPE AND SCREEN
ABO/RH(D): O POS
Antibody Screen: NEGATIVE
Unit division: 0
Unit division: 0
Unit division: 0
Unit division: 0

## 2013-04-03 LAB — CBC
HCT: 27.4 % — ABNORMAL LOW (ref 39.0–52.0)
HEMOGLOBIN: 9.3 g/dL — AB (ref 13.0–17.0)
MCH: 30.3 pg (ref 26.0–34.0)
MCHC: 33.9 g/dL (ref 30.0–36.0)
MCV: 89.3 fL (ref 78.0–100.0)
Platelets: 207 10*3/uL (ref 150–400)
RBC: 3.07 MIL/uL — ABNORMAL LOW (ref 4.22–5.81)
RDW: 16.1 % — ABNORMAL HIGH (ref 11.5–15.5)
WBC: 8.5 10*3/uL (ref 4.0–10.5)

## 2013-04-03 LAB — BASIC METABOLIC PANEL
BUN: 18 mg/dL (ref 6–23)
CO2: 23 mEq/L (ref 19–32)
Calcium: 8.8 mg/dL (ref 8.4–10.5)
Chloride: 101 mEq/L (ref 96–112)
Creatinine, Ser: 1.08 mg/dL (ref 0.50–1.35)
GFR, EST AFRICAN AMERICAN: 70 mL/min — AB (ref >90)
GFR, EST NON AFRICAN AMERICAN: 61 mL/min — AB (ref >90)
GLUCOSE: 101 mg/dL — AB (ref 70–99)
POTASSIUM: 4.1 meq/L (ref 3.7–5.3)
SODIUM: 137 meq/L (ref 137–147)

## 2013-04-03 LAB — PROTIME-INR
INR: 1.11 (ref 0.00–1.49)
PROTHROMBIN TIME: 14.1 s (ref 11.6–15.2)

## 2013-04-03 NOTE — Clinical Social Work Note (Signed)
CSW spoke with patient's nurse Jarrett Soho, regarding d/c planning needs. Per nurse, patient d/c currently pending. CSW verbalized understanding of the above and will continue to be available as needs arise.  [place], [place] Weekend Clinical Social Worker [phone]

## 2013-04-03 NOTE — Progress Notes (Signed)
Subjective: RN reported increased confusion, visual hallucinations.  Received Ativan X 1 overnight.    Objective: Vital signs in last 24 hours: Temp:  [98.1 F (36.7 C)-98.9 F (37.2 C)] 98.1 F (36.7 C) (03/21 0300) Pulse Rate:  [71-92] 76 (03/21 0300) Resp:  [18-32] 22 (03/21 0300) BP: (108-177)/(40-93) 167/76 mmHg (03/21 0300) SpO2:  [98 %-100 %] 98 % (03/21 0300) Weight:  [80.4 kg (177 lb 4 oz)] 80.4 kg (177 lb 4 oz) (03/21 0300) Weight change: -1.746 kg (-3 lb 13.6 oz) Last BM Date: 03/28/13  CBG (last 3)  No results found for this basename: GLUCAP,  in the last 72 hours  Intake/Output from previous day: 03/20 0701 - 03/21 0700 In: 1065.5 [P.O.:240; I.V.:800; Blood:25.5] Out: 1950 [Urine:1950] Intake/Output this shift:    General appearance: sedated, will grimace to noxious stimulus (RN reports he was interactive 15 minutes prior) Eyes: no scleral icterus Throat: oropharynx dry Resp: clear to auscultation bilaterally Cardio: regular rate and rhythm GI: soft, non-tender; bowel sounds normal; no masses,  no organomegaly Extremities: no clubbing, cyanosis or edema; SCDs in place   Lab Results:  Recent Labs  04/02/13 0550 04/03/13 0304  NA 137 137  K 4.4 4.1  CL 103 101  CO2 23 23  GLUCOSE 95 101*  BUN 22 18  CREATININE 1.28 1.08  CALCIUM 8.5 8.8   No results found for this basename: AST, ALT, ALKPHOS, BILITOT, PROT, ALBUMIN,  in the last 72 hours  Recent Labs  04/02/13 0550 04/03/13 0304  WBC 8.2 8.5  HGB 6.2* 9.3*  HCT 18.5* 27.4*  MCV 93.0 89.3  PLT 184 207   Lab Results  Component Value Date   INR 1.11 04/03/2013   INR 1.52* 04/02/2013   INR 2.42* 04/01/2013   No results found for this basename: CKTOTAL, CKMB, CKMBINDEX, TROPONINI,  in the last 72 hours No results found for this basename: TSH, T4TOTAL, FREET3, T3FREE, THYROIDAB,  in the last 72 hours No results found for this basename: VITAMINB12, FOLATE, FERRITIN, TIBC, IRON, RETICCTPCT,  in  the last 72 hours  Studies/Results: Ct Abdomen Pelvis Wo Contrast  04/02/2013   CLINICAL DATA:  Continue blood loss requiring transfusions question retroperitoneal hemorrhage, history atrial fibrillation, diverticulosis, gout, melanoma, smoking history  EXAM: CT ABDOMEN AND PELVIS WITHOUT CONTRAST  TECHNIQUE: Multidetector CT imaging of the abdomen and pelvis was performed following the standard protocol without intravenous contrast. Sagittal and coronal MPR images reconstructed from axial data set. Oral contrast not administered for this indication.  COMPARISON:  03/30/2013  FINDINGS: Pleural plaques at lung bases, partially calcified question asbestos exposure.  1.8 x 1.7 cm diameter cyst right kidney image 34 stable.  Within limits of a nonenhanced exam no focal abnormalities of the liver, spleen, pancreas, kidneys, or adrenal glands otherwise identified.  Atherosclerotic calcifications aorta and iliac arteries.  No retroperitoneal hemorrhage.  Normal appendix.  Stomach decompressed, wall thickness suboptimally assessed.  Large and small bowel loops unremarkable.  Normal bladder in ureters.  Beam hardening artifacts from right hip prosthesis.  No mass, adenopathy, free fluid or inflammatory process.  Diffuse infiltrative changes identified within soft tissues at the right hip extending into right thigh.  Osseous demineralization with scattered degenerative disc and facet disease changes of the lumbar spine.  Question right inguinal hernia containing fat.  IMPRESSION: Question asbestos exposure and right inguinal hernia.  No evidence of retroperitoneal hemorrhage.  Diffuse infiltrative changes within soft tissues of the right hip region extending into the right  thigh beyond extent of this exam; differential diagnosis includes inflammatory processes, hemorrhage, trauma, deep vein thrombosis, dependent positioning, and other etiologies.   Electronically Signed   By: Lavonia Dana M.D.   On: 04/02/2013 18:43   Mr  Femur Right Wo Contrast  04/01/2013   CLINICAL DATA:  Right leg pain and swelling secondary to a fall. Arch proximal right anterior thigh hematoma.  EXAM: MRI OF THE RIGHT FEMUR WITHOUT CONTRAST  TECHNIQUE: Multiplanar, multisequence MR imaging was performed. No intravenous contrast was administered.  COMPARISON:  CT scan dated 03/30/2013 and radiographs dated 03/29/2013  FINDINGS: There is a 13.5 x 10.0 x 7.0 cm intramuscular hematoma in the proximal right vastus lateralis muscle. There is edema in the periphery of the muscle due to the hematoma. There is a mass effect upon the adjacent muscles. The patient has some edema in the vastus intermedius and vastus medialis muscles due to the adjacent hematoma.  There is no underlying acute abnormality of the right femur. Right proximal femoral prosthesis is noted. There is circumferential subcutaneous edema in the proximal right thigh.  IMPRESSION: Large intramuscular hematoma in the proximal right vastus lateralis muscle with secondary edema in the muscles and subcutaneous soft tissues. This is to the expected degree considering the presence the hematoma. No evidence of muscle necrosis.   Electronically Signed   By: Rozetta Nunnery M.D.   On: 04/01/2013 09:16     Medications: Scheduled: . allopurinol  100 mg Oral Daily  . amiodarone  200 mg Oral Daily  . bisacodyl  5 mg Oral q morning - 10a  . citalopram  20 mg Oral Daily  . docusate sodium  100 mg Oral BID  . folic acid  1 mg Oral Daily  . metoprolol tartrate  25 mg Oral BID  . morphine  30 mg Oral Q12H  . multivitamin with minerals  1 tablet Oral Daily  . pantoprazole  40 mg Oral Q0600  . polyethylene glycol  17 g Oral BID  . senna  1 tablet Oral Daily  . sodium chloride  3 mL Intravenous Q12H  . thiamine  100 mg Oral Daily   Continuous:   Assessment/Plan: Active Problems: 1. Anemia requiring transfusion- appropriate response to 2 units PRBCs yesterday.  Continue to monitor.  Blood loss secondary  to large hematoma.  No evidence of GI blood loss. 2. Right thigh hematoma- no complicating features seen on CT. 3. Atrial Fibrillation- continue rate control.  No anticoagulation due to hematoma and fall risk. 4. Acute on Chronic Renal Failure- resolved with hydration. 5. Delirium secondary to presumed ETOH withdrawal syndrome- continue CIWA protocol with Ativan prn.  Obtain Head CT to rule out SDH in setting of fall. 6. Disposition- anticipate discharge to SNF early next week if stable.  Will transfer to floor in 1-2 days if mental status stable.   LOS: 4 days   Marton Redwood 04/03/2013, 7:45 AM

## 2013-04-04 DIAGNOSIS — G9608 Other cranial cerebrospinal fluid leak: Secondary | ICD-10-CM | POA: Diagnosis present

## 2013-04-04 DIAGNOSIS — G960 Cerebrospinal fluid leak: Secondary | ICD-10-CM

## 2013-04-04 DIAGNOSIS — I629 Nontraumatic intracranial hemorrhage, unspecified: Secondary | ICD-10-CM | POA: Diagnosis not present

## 2013-04-04 LAB — BASIC METABOLIC PANEL
BUN: 21 mg/dL (ref 6–23)
CO2: 24 mEq/L (ref 19–32)
Calcium: 9.4 mg/dL (ref 8.4–10.5)
Chloride: 97 mEq/L (ref 96–112)
Creatinine, Ser: 1.12 mg/dL (ref 0.50–1.35)
GFR calc non Af Amer: 58 mL/min — ABNORMAL LOW (ref >90)
GFR, EST AFRICAN AMERICAN: 67 mL/min — AB (ref >90)
Glucose, Bld: 98 mg/dL (ref 70–99)
POTASSIUM: 4.1 meq/L (ref 3.7–5.3)
Sodium: 135 mEq/L — ABNORMAL LOW (ref 137–147)

## 2013-04-04 LAB — CBC
HCT: 32.5 % — ABNORMAL LOW (ref 39.0–52.0)
HEMOGLOBIN: 11.1 g/dL — AB (ref 13.0–17.0)
MCH: 30.4 pg (ref 26.0–34.0)
MCHC: 34.2 g/dL (ref 30.0–36.0)
MCV: 89 fL (ref 78.0–100.0)
Platelets: 259 10*3/uL (ref 150–400)
RBC: 3.65 MIL/uL — ABNORMAL LOW (ref 4.22–5.81)
RDW: 15.3 % (ref 11.5–15.5)
WBC: 11.3 10*3/uL — AB (ref 4.0–10.5)

## 2013-04-04 LAB — PROTIME-INR
INR: 1.08 (ref 0.00–1.49)
Prothrombin Time: 13.8 seconds (ref 11.6–15.2)

## 2013-04-04 NOTE — Progress Notes (Signed)
Subjective: Much more alert this am.  Sitting in the chair.  Complains of being "sick" and he doesn't know why.  Reports that he is uncomfortable but not in any pain.  Denies hallucinations.  Objective: Vital signs in last 24 hours: Temp:  [97.6 F (36.4 C)-98.9 F (37.2 C)] 98.2 F (36.8 C) (03/22 0728) Pulse Rate:  [84-96] 96 (03/22 0728) Resp:  [15-29] 29 (03/22 0728) BP: (145-186)/(66-88) 156/88 mmHg (03/22 0728) SpO2:  [98 %-100 %] 100 % (03/22 0728) Weight:  [79.3 kg (174 lb 13.2 oz)] 79.3 kg (174 lb 13.2 oz) (03/22 0356) Weight change: -1.1 kg (-2 lb 6.8 oz) Last BM Date: 03/28/13  CBG (last 3)  No results found for this basename: GLUCAP,  in the last 72 hours  Intake/Output from previous day: 03/21 0701 - 03/22 0700 In: 3 [I.V.:3] Out: 1950 [Urine:1950] Intake/Output this shift: Total I/O In: 240 [P.O.:240] Out: 175 [Urine:175]  General appearance: alert and fatigued Eyes: no scleral icterus Throat: oropharynx moist without erythema Resp: clear to auscultation bilaterally Cardio: regular rate and rhythm ZO:[id number] distended, nontender; bowel sounds normal; no masses,  no organomegaly Extremities: no clubbing, cyanosis or edema; right thigh tightness but no significant swelling Neurologic: 5/5 strength in all extremities; finger-to-nose intact.  No facial asymetry   Lab Results:  Recent Labs  04/03/13 0304 04/04/13 0321  NA 137 135*  K 4.1 4.1  CL 101 97  CO2 23 24  GLUCOSE 101* 98  BUN 18 21  CREATININE 1.08 1.12  CALCIUM 8.8 9.4   No results found for this basename: AST, ALT, ALKPHOS, BILITOT, PROT, ALBUMIN,  in the last 72 hours  Recent Labs  04/03/13 0304 04/04/13 0321  WBC 8.5 11.3*  HGB 9.3* 11.1*  HCT 27.4* 32.5*  MCV 89.3 89.0  PLT 207 259   Lab Results  Component Value Date   INR 1.08 04/04/2013   INR 1.11 04/03/2013   INR 1.52* 04/02/2013   No results found for this basename: CKTOTAL, CKMB, CKMBINDEX, TROPONINI,  in the last 72  hours No results found for this basename: TSH, T4TOTAL, FREET3, T3FREE, THYROIDAB,  in the last 72 hours No results found for this basename: VITAMINB12, FOLATE, FERRITIN, TIBC, IRON, RETICCTPCT,  in the last 72 hours  Studies/Results: Ct Abdomen Pelvis Wo Contrast  04/02/2013   CLINICAL DATA:  Continue blood loss requiring transfusions question retroperitoneal hemorrhage, history atrial fibrillation, diverticulosis, gout, melanoma, smoking history  EXAM: CT ABDOMEN AND PELVIS WITHOUT CONTRAST  TECHNIQUE: Multidetector CT imaging of the abdomen and pelvis was performed following the standard protocol without intravenous contrast. Sagittal and coronal MPR images reconstructed from axial data set. Oral contrast not administered for this indication.  COMPARISON:  03/30/2013  FINDINGS: Pleural plaques at lung bases, partially calcified question asbestos exposure.  1.8 x 1.7 cm diameter cyst right kidney image 34 stable.  Within limits of a nonenhanced exam no focal abnormalities of the liver, spleen, pancreas, kidneys, or adrenal glands otherwise identified.  Atherosclerotic calcifications aorta and iliac arteries.  No retroperitoneal hemorrhage.  Normal appendix.  Stomach decompressed, wall thickness suboptimally assessed.  Large and small bowel loops unremarkable.  Normal bladder in ureters.  Beam hardening artifacts from right hip prosthesis.  No mass, adenopathy, free fluid or inflammatory process.  Diffuse infiltrative changes identified within soft tissues at the right hip extending into right thigh.  Osseous demineralization with scattered degenerative disc and facet disease changes of the lumbar spine.  Question right inguinal hernia containing fat.  IMPRESSION: Question asbestos exposure and right inguinal hernia.  No evidence of retroperitoneal hemorrhage.  Diffuse infiltrative changes within soft tissues of the right hip region extending into the right thigh beyond extent of this exam; differential  diagnosis includes inflammatory processes, hemorrhage, trauma, deep vein thrombosis, dependent positioning, and other etiologies.   Electronically Signed   By: Lavonia Dana M.D.   On: 04/02/2013 18:43   Ct Head Wo Contrast  04/03/2013   CLINICAL DATA:  Delirium, fall, R/O SDH  EXAM: CT HEAD WITHOUT CONTRAST  TECHNIQUE: Contiguous axial images were obtained from the base of the skull through the vertex without intravenous contrast.  COMPARISON:  CT C SPINE W/O CM dated 03/28/2013; CT HEAD W/O CM dated 03/28/2013; CT HEAD W/O CM dated 10/20/2009  FINDINGS: A small punctate area of increased attenuation is appreciated along the periphery of the posterior lateral cortex of the left frontal lobe image 22 series 2, measuring 4 x 6 mm. A second area of punctate increased attenuation is appreciated along the posterior aspect of the septum pellucidum on the right image 19 series 2. These areas are consistent with punctate foci of hemorrhage which have developed in the interim when compared to prior. When compared to the previous study there has been interval development of a subdural hygroma in the left frontal region measuring 1.6 cm in diameter. A second smaller area is appreciated within the right frontal region measuring 0.73 cm in diameter. An arachnoid cyst is again appreciated within the anterior base of the left temporal fossa which appears stable. There is global atrophy. There is no evidence of subfalcine nor tonsillar herniation. Diffuse areas of low attenuation are appreciated within the subcortical, deep, and periventricular white matter regions. There is no evidence of a depressed skull fracture. The visualized paranasal sinuses and mastoid air cells are patent.  IMPRESSION: 1. Punctate areas of acute hemorrhage along the posterior-lateral cortex of the left frontal lobe and along the posterior aspect of the septum callosum on the right. These results will be called to the ordering clinician or representative by  the Radiologist Assistant, and communication documented in the PACS Dashboard. 2. Interval development of bilateral cystic hygromas. 3. Involutional and chronic changes.   Electronically Signed   By: Margaree Mackintosh M.D.   On: 04/03/2013 09:42     Medications: Scheduled: . allopurinol  100 mg Oral Daily  . amiodarone  200 mg Oral Daily  . bisacodyl  5 mg Oral q morning - 10a  . citalopram  20 mg Oral Daily  . docusate sodium  100 mg Oral BID  . folic acid  1 mg Oral Daily  . metoprolol tartrate  25 mg Oral BID  . morphine  30 mg Oral Q12H  . multivitamin with minerals  1 tablet Oral Daily  . pantoprazole  40 mg Oral Q0600  . polyethylene glycol  17 g Oral BID  . senna  1 tablet Oral Daily  . sodium chloride  3 mL Intravenous Q12H  . thiamine  100 mg Oral Daily   Continuous:   Assessment/Plan: Active Problems:  1. Anemia requiring transfusion- Hg stable s/p transfusion. Continue to monitor. Blood loss secondary to large hematoma. No evidence of GI blood loss.  2. Right thigh hematoma- no complicating features seen on CT.  Appears stable.  3. Atrial Fibrillation- continue rate control. No anticoagulation due to hematoma and fall risk.  4. Acute on Chronic Renal Failure- resolved with hydration.  5. Delirium secondary to presumed ETOH  withdrawal syndrome/Metabolic Encephalopathy- continue CIWA protocol with Ativan prn (try to limit use due to excessive sedation). RN reports that MS Contin has been held for past 48 hours due to hallucinations- he indicates that he takes at home as needed, not regularly. Watch for narcotic withdrawal. 6. Intracranial hemorrhage/Subdural Hygroma- discussed with radiology and Neurosurgery.  Unless clinical deterioration will plan to repeat CT in 1-2 days to assess for evolution.  Doubt this is contributing to delirium 7. Disposition- anticipate discharge to SNF early next week if stable. Will transfer to floor tomorrow if mental status stable.    LOS: 5 days    Marton Redwood 04/04/2013, 8:09 AM

## 2013-04-05 ENCOUNTER — Encounter (INDEPENDENT_AMBULATORY_CARE_PROVIDER_SITE_OTHER): Payer: Medicare Other | Admitting: Ophthalmology

## 2013-04-05 ENCOUNTER — Inpatient Hospital Stay (HOSPITAL_COMMUNITY): Payer: Medicare Other

## 2013-04-05 LAB — BASIC METABOLIC PANEL
BUN: 30 mg/dL — ABNORMAL HIGH (ref 6–23)
CALCIUM: 8.7 mg/dL (ref 8.4–10.5)
CHLORIDE: 97 meq/L (ref 96–112)
CO2: 24 mEq/L (ref 19–32)
Creatinine, Ser: 1.27 mg/dL (ref 0.50–1.35)
GFR calc non Af Amer: 50 mL/min — ABNORMAL LOW (ref >90)
GFR, EST AFRICAN AMERICAN: 58 mL/min — AB (ref >90)
Glucose, Bld: 111 mg/dL — ABNORMAL HIGH (ref 70–99)
Potassium: 3.4 mEq/L — ABNORMAL LOW (ref 3.7–5.3)
SODIUM: 134 meq/L — AB (ref 137–147)

## 2013-04-05 LAB — CBC
HEMATOCRIT: 28.8 % — AB (ref 39.0–52.0)
Hemoglobin: 9.8 g/dL — ABNORMAL LOW (ref 13.0–17.0)
MCH: 30.2 pg (ref 26.0–34.0)
MCHC: 34 g/dL (ref 30.0–36.0)
MCV: 88.9 fL (ref 78.0–100.0)
PLATELETS: 290 10*3/uL (ref 150–400)
RBC: 3.24 MIL/uL — AB (ref 4.22–5.81)
RDW: 15 % (ref 11.5–15.5)
WBC: 12 10*3/uL — AB (ref 4.0–10.5)

## 2013-04-05 LAB — PROTIME-INR
INR: 1.09 (ref 0.00–1.49)
Prothrombin Time: 13.9 seconds (ref 11.6–15.2)

## 2013-04-05 MED ORDER — MORPHINE SULFATE ER 15 MG PO TBCR
30.0000 mg | EXTENDED_RELEASE_TABLET | Freq: Two times a day (BID) | ORAL | Status: DC
  Administered 2013-04-05 – 2013-04-06 (×3): 30 mg via ORAL
  Filled 2013-04-05 (×3): qty 2

## 2013-04-05 NOTE — Progress Notes (Addendum)
Update: CSW received update from [facility] stating that patient needs to be seen again by PT for social worker to update [employer] with updated clinicals in order for them to give snf authorization.  CSW continuing to follow patient for dc to [facility] per social work handoff. CSW had to resubmit clinicals to insurance company today for insurance authorization. Awaiting insurance auth at this time.   Jeanette Caprice, MSW, [place]

## 2013-04-05 NOTE — Progress Notes (Signed)
Utilization review completed.  

## 2013-04-05 NOTE — Progress Notes (Signed)
Physician notified: Holwerda by office staff.  At: 1020   Regarding: Pt PIV out of date (3/17). RN attempted new site, unsuccessful. OK to keep IV out, or keep PIV outdated, need new site? No IV medications.  Awaiting return response.   Returned Response at: 1023  Order(s): Will need IV until DC.

## 2013-04-05 NOTE — Progress Notes (Addendum)
0910: Attempted to call report to [facility], RN 2W, unable to take due to huddle. Nira Conn will call back.   1048: Wife, Pamala Hurry, call with update and transfer. Attempted to call report to [facility], Therapist, sports. Unable to take report, in pt room. Will call back in 5 minutes, awaiting call back. Continue to monitor patient.   1050: Paged IV team x2. When call back will send to new location, 2W27. Returned response at 1109, on IV team list, sent to new location.  1100: Third attempt at report to [facility], [place]. Report given to Chester Holstein, RN taking patient. All belongings, including shoulder bag, sent with patient. VSS. eICU and CCMT(Valerie) notified of transfer on tele.   1120: Hearing aid found on floor, placed in denture cup and sent to new room.

## 2013-04-05 NOTE — Progress Notes (Addendum)
Subjective: Had BM over wkd  No hallucinations over past 24 hours  Feeling restless, tired of being in bed   Objective: Vital signs in last 24 hours: Temp:  [97.4 F (36.3 C)-98.4 F (36.9 C)] 98 F (36.7 C) (03/23 0321) Pulse Rate:  [71-80] 75 (03/23 0321) Resp:  [16-20] 20 (03/23 0321) BP: (118-156)/(52-73) 118/63 mmHg (03/23 0321) SpO2:  [99 %-100 %] 100 % (03/23 0321) Weight:  [77.3 kg (170 lb 6.7 oz)] 77.3 kg (170 lb 6.7 oz) (03/23 0321) Weight change: -2 kg (-4 lb 6.6 oz) Last BM Date: 04/04/13  CBG (last 3)  No results found for this basename: GLUCAP,  in the last 72 hours  Intake/Output from previous day: 03/22 0701 - 03/23 0700 In: 240 [P.O.:240] Out: 950 [Urine:950] Intake/Output this shift:    General appearance: WM in NAD  Eyes: no scleral icterus  Throat: oropharynx moist without erythema  Resp: CTAB, no wheezing/rales  Cardio: RRR, no MRG  GI: soft, non-tender; bowel sounds normal; no masses, no organomegaly  Extremities: edema of R thigh and knee w/o ecchymosis  Skin: ecchymosis on R neck and R flank/hip    Lab Results:  Recent Labs  04/04/13 0321 04/05/13 0248  NA 135* 134*  K 4.1 3.4*  CL 97 97  CO2 24 24  GLUCOSE 98 111*  BUN 21 30*  CREATININE 1.12 1.27  CALCIUM 9.4 8.7   No results found for this basename: AST, ALT, ALKPHOS, BILITOT, PROT, ALBUMIN,  in the last 72 hours  Recent Labs  04/04/13 0321 04/05/13 0248  WBC 11.3* 12.0*  HGB 11.1* 9.8*  HCT 32.5* 28.8*  MCV 89.0 88.9  PLT 259 290   Lab Results  Component Value Date   INR 1.09 04/05/2013   INR 1.08 04/04/2013   INR 1.11 04/03/2013   No results found for this basename: CKTOTAL, CKMB, CKMBINDEX, TROPONINI,  in the last 72 hours No results found for this basename: TSH, T4TOTAL, FREET3, T3FREE, THYROIDAB,  in the last 72 hours No results found for this basename: VITAMINB12, FOLATE, FERRITIN, TIBC, IRON, RETICCTPCT,  in the last 72 hours  Studies/Results: Ct Head Wo  Contrast  04/03/2013   CLINICAL DATA:  Delirium, fall, R/O SDH  EXAM: CT HEAD WITHOUT CONTRAST  TECHNIQUE: Contiguous axial images were obtained from the base of the skull through the vertex without intravenous contrast.  COMPARISON:  CT C SPINE W/O CM dated 03/28/2013; CT HEAD W/O CM dated 03/28/2013; CT HEAD W/O CM dated 10/20/2009  FINDINGS: A small punctate area of increased attenuation is appreciated along the periphery of the posterior lateral cortex of the left frontal lobe image 22 series 2, measuring 4 x 6 mm. A second area of punctate increased attenuation is appreciated along the posterior aspect of the septum pellucidum on the right image 19 series 2. These areas are consistent with punctate foci of hemorrhage which have developed in the interim when compared to prior. When compared to the previous study there has been interval development of a subdural hygroma in the left frontal region measuring 1.6 cm in diameter. A second smaller area is appreciated within the right frontal region measuring 0.73 cm in diameter. An arachnoid cyst is again appreciated within the anterior base of the left temporal fossa which appears stable. There is global atrophy. There is no evidence of subfalcine nor tonsillar herniation. Diffuse areas of low attenuation are appreciated within the subcortical, deep, and periventricular white matter regions. There is no evidence of a  depressed skull fracture. The visualized paranasal sinuses and mastoid air cells are patent.  IMPRESSION: 1. Punctate areas of acute hemorrhage along the posterior-lateral cortex of the left frontal lobe and along the posterior aspect of the septum callosum on the right. These results will be called to the ordering clinician or representative by the Radiologist Assistant, and communication documented in the PACS Dashboard. 2. Interval development of bilateral cystic hygromas. 3. Involutional and chronic changes.   Electronically Signed   By: Margaree Mackintosh  M.D.   On: 04/03/2013 09:42     Medications: Scheduled: . allopurinol  100 mg Oral Daily  . amiodarone  200 mg Oral Daily  . bisacodyl  5 mg Oral q morning - 10a  . citalopram  20 mg Oral Daily  . docusate sodium  100 mg Oral BID  . folic acid  1 mg Oral Daily  . metoprolol tartrate  25 mg Oral BID  . multivitamin with minerals  1 tablet Oral Daily  . pantoprazole  40 mg Oral Q0600  . polyethylene glycol  17 g Oral BID  . senna  1 tablet Oral Daily  . sodium chloride  3 mL Intravenous Q12H  . thiamine  100 mg Oral Daily   Continuous:   Assessment/Plan: Active Problems:  1. Acute blood loss anemia - Hg stable s/p transfusion x 4U. Continue to monitor. Blood loss secondary to large hematoma. No evidence of GI blood loss. Has f/u w/ heme on wednesday 2. Right thigh hematoma- no complicating features seen on CT.  Appears stable.  3. Atrial Fibrillation- continue rate control. No anticoagulation due to hematoma and fall risk. Seeing Dr Einar Gip on 3/26 at 1145 4. Acute on Chronic Renal Failure- resolved with hydration. Continue to encourage PO hydration  5. Delirium secondary to presumed ETOH withdrawal syndrome/Metabolic Encephalopathy- resolved. D/C CIWA . Resume MS contin 30mg  BID 6. Intracranial hemorrhage/Subdural Hygroma-   Doubt this is contributing to delirium. Repeat CT head today to ensure stability. Coumadin on hold indefinitely 7. Disposition- anticipate xfer to floor bed today, discharge to SNF tomorrow if remains stable     LOS: 6 days   Ricardo Allen 04/05/2013, 7:39 AM

## 2013-04-06 LAB — CBC
HEMATOCRIT: 28.4 % — AB (ref 39.0–52.0)
Hemoglobin: 9.7 g/dL — ABNORMAL LOW (ref 13.0–17.0)
MCH: 31 pg (ref 26.0–34.0)
MCHC: 34.2 g/dL (ref 30.0–36.0)
MCV: 90.7 fL (ref 78.0–100.0)
PLATELETS: 293 10*3/uL (ref 150–400)
RBC: 3.13 MIL/uL — AB (ref 4.22–5.81)
RDW: 15.4 % (ref 11.5–15.5)
WBC: 9.3 10*3/uL (ref 4.0–10.5)

## 2013-04-06 LAB — BASIC METABOLIC PANEL
BUN: 29 mg/dL — ABNORMAL HIGH (ref 6–23)
CALCIUM: 8.9 mg/dL (ref 8.4–10.5)
CHLORIDE: 100 meq/L (ref 96–112)
CO2: 24 mEq/L (ref 19–32)
CREATININE: 1.25 mg/dL (ref 0.50–1.35)
GFR calc non Af Amer: 51 mL/min — ABNORMAL LOW (ref >90)
GFR, EST AFRICAN AMERICAN: 59 mL/min — AB (ref >90)
Glucose, Bld: 94 mg/dL (ref 70–99)
Potassium: 4.2 mEq/L (ref 3.7–5.3)
SODIUM: 137 meq/L (ref 137–147)

## 2013-04-06 LAB — PROTIME-INR
INR: 1.16 (ref 0.00–1.49)
Prothrombin Time: 14.6 seconds (ref 11.6–15.2)

## 2013-04-06 MED ORDER — BISACODYL 5 MG PO TBEC: 5.0000 mg | DELAYED_RELEASE_TABLET | Freq: Every morning | ORAL | Status: DC

## 2013-04-06 MED ORDER — HYDROCODONE-ACETAMINOPHEN 10-325 MG PO TABS: 1.0000 | ORAL_TABLET | Freq: Four times a day (QID) | ORAL | Status: DC | PRN

## 2013-04-06 MED ORDER — MORPHINE SULFATE ER 30 MG PO TBCR: 30.0000 mg | EXTENDED_RELEASE_TABLET | Freq: Two times a day (BID) | ORAL | Status: DC

## 2013-04-06 NOTE — Progress Notes (Signed)
Clinical Social Work Department CLINICAL SOCIAL WORK PLACEMENT NOTE 04/06/2013  Patient:  Ricardo Allen, Ricardo Allen  Account Number:  [id number] Admit date:  03/30/2013  Clinical Social Worker:  Berton Mount, Latanya Presser  Date/time:  04/01/2013 11:00 AM  Clinical Social Work is seeking post-discharge placement for this patient at the following level of care:   [place]   (*CSW will update this form in Epic as items are completed)   04/01/2013  Patient/family provided with [place] Department of Clinical Social Work's list of facilities offering this level of care within the geographic area requested by the patient (or if unable, by the patient's family).  04/01/2013  Patient/family informed of their freedom to choose among providers that offer the needed level of care, that participate in Medicare, Medicaid or managed care program needed by the patient, have an available bed and are willing to accept the patient.  04/01/2013  Patient/family informed of [facility]' ownership interest in [facility], as well as of the fact that they are under no obligation to receive care at this facility.  PASARR submitted to EDS on  PASARR number received from EDS on   FL2 transmitted to all facilities in geographic area requested by pt/family on  04/01/2013 FL2 transmitted to all facilities within larger geographic area on   Patient informed that his/her managed care company has contracts with or will negotiate with  certain facilities, including the following:     Patient/family informed of bed offers received:  04/06/2013 Patient chooses bed at [facility] Physician recommends and patient chooses bed at    Patient to be transferred to [facility] on  04/06/2013 Patient to be transferred to facility by EMS  The following physician request were entered in Epic:   Additional Comments:  Jeanette Caprice, MSW, [place]

## 2013-04-06 NOTE — Progress Notes (Signed)
Physical Therapy Treatment Patient Details Name: Ricardo Allen MRN: [id number] DOB: [birth year] Today's Date: 04/06/2013    History of Present Illness 80 y/o M presents for ER f/u for fall sustained Sunday afternoon around 3pm when getting out of the car after syncopal episode [place] . He admits to having a glass of wine at lunch followed by 2 gin/tonics and one vicodin. He was eval with CT Head and XR Knee per      PT Comments    Unsteady of gait even with cane due to weakness and some soreness.  Agree with ST SNF before home with limited assist of his wife.  Follow Up Recommendations  SNF     Equipment Recommendations  None recommended by PT    Recommendations for Other Services       Precautions / Restrictions Precautions Precautions: Fall Precaution Comments: recent fall and syncope  Restrictions Weight Bearing Restrictions: No    Mobility  Bed Mobility Overal bed mobility: Needs Assistance Bed Mobility: Supine to Sit     Supine to sit: Min guard;HOB elevated     General bed mobility comments: mild struggle , but no assist  Transfers Overall transfer level: Needs assistance   Transfers: Sit to/from Stand Sit to Stand: Min assist         General transfer comment: assist to come forward and steady until ready  Ambulation/Gait Ambulation/Gait assistance: Min guard (min on 1 occasion during turn) Ambulation Distance (Feet): 150 Feet Assistive device: Straight cane Gait Pattern/deviations: Step-through pattern;Decreased step length - right;Decreased step length - left;Decreased stride length Gait velocity: slow with little ability to increase speed. Gait velocity interpretation: Below normal speed for age/gender General Gait Details: mildly unsteady, but uses his cane appropriately   Stairs            Wheelchair Mobility    Modified Rankin (Stroke Patients Only)       Balance Overall balance assessment: Needs assistance Sitting-balance support:  No upper extremity supported;Feet supported Sitting balance-Leahy Scale: Fair Sitting balance - Comments: UE supported   Standing balance support: Single extremity supported;During functional activity Standing balance-Leahy Scale: Poor Standing balance comment: wobbly, but no overt LOB with use of cane                    Cognition Arousal/Alertness: Awake/alert Behavior During Therapy: WFL for tasks assessed/performed Overall Cognitive Status: Within Functional Limits for tasks assessed                      Exercises      General Comments General comments (skin integrity, edema, etc.): Should improve well with progressive amb and healing of the bruising/hematoma      Pertinent Vitals/Pain     Home Living                      Prior Function            PT Goals (current goals can now be found in the care plan section) Acute Rehab PT Goals Patient Stated Goal: to get better and go home Time For Goal Achievement: 04/14/13 Potential to Achieve Goals: Good Progress towards PT goals: Progressing toward goals    Frequency  Min 3X/week    PT Plan Current plan remains appropriate    End of Session   Activity Tolerance: Patient tolerated treatment well Patient left: in chair;with call bell/phone within reach     Time: 0937-1000 PT Time Calculation (min):  23 min  Charges:  $Gait Training: 8-22 mins $Therapeutic Activity: 8-22 mins                    G Codes:      Tryphena Perkovich, Tessie Fass 04/06/2013, 11:23 AM 04/06/2013  Donnella Sham, PT [phone]  (pager)

## 2013-04-06 NOTE — Discharge Summary (Signed)
Physician Discharge Summary    Ricardo Allen  MR#: [id number]  DOB:1925/08/20  Date of Admission: 03/30/2013 Date of Discharge: 04/06/2013  Attending Physician:Kirk Basquez  Patient's NID:[id number], Ricardo Reaper, MD  Consults: GI  Heme  Cards   Discharge Diagnoses: Active Problems:   Anemia   Intracranial hemorrhage   Subdural hygroma   Discharge Medications:   Medication List    STOP taking these medications       ferrous sulfate 325 (65 FE) MG tablet  Commonly known as:  FERROUSUL     morphine 30 MG 12 hr tablet  Commonly known as:  MS CONTIN  Replaced by:  morphine 30 MG 12 hr tablet     oxyCODONE-acetaminophen 5-325 MG per tablet  Commonly known as:  PERCOCET     warfarin 2 MG tablet  Commonly known as:  COUMADIN      TAKE these medications       alendronate 70 MG tablet  Commonly known as:  FOSAMAX  Take 70 mg by mouth every Sunday. Take with a full glass of water on an empty stomach.     allopurinol 100 MG tablet  Commonly known as:  ZYLOPRIM  Take 1 tablet (100 mg total) by mouth daily.     amiodarone 200 MG tablet  Commonly known as:  PACERONE  Take 1 tablet (200 mg total) by mouth daily.     B-6 Folic Acid 614-4315-40 MCG-MCG-MG Caps  Take 1 capsule by mouth daily.     bisacodyl 5 MG EC tablet  Commonly known as:  DULCOLAX  Take 1 tablet (5 mg total) by mouth every morning.     citalopram 20 MG tablet  Commonly known as:  CELEXA  Take 1 tablet (20 mg total) by mouth daily.     colchicine 0.6 MG tablet  Take 0.6 mg by mouth daily as needed (for gout).     DSS 100 MG Caps  Take 100 mg by mouth 2 (two) times daily.     HYDROcodone-acetaminophen 10-325 MG per tablet  Commonly known as:  NORCO  Take 1 tablet by mouth every 6 (six) hours as needed.     metoprolol tartrate 25 MG tablet  Commonly known as:  LOPRESSOR  Take 1 tablet (25 mg total) by mouth 2 (two) times daily.     morphine 30 MG 12 hr tablet  Commonly known as:  MS CONTIN   Take 1 tablet (30 mg total) by mouth every 12 (twelve) hours.     pantoprazole 40 MG tablet  Commonly known as:  PROTONIX  Take 1 tablet (40 mg total) by mouth daily at 6 (six) AM.     polyethylene glycol packet  Commonly known as:  MIRALAX / GLYCOLAX  Take 17 g by mouth daily as needed (constipation).     PROBIOTIC DAILY PO  Take 1 capsule by mouth daily.        Hospital Procedures: Ct Abdomen Pelvis Wo Contrast  04/02/2013   CLINICAL DATA:  Continue blood loss requiring transfusions question retroperitoneal hemorrhage, history atrial fibrillation, diverticulosis, gout, melanoma, smoking history  EXAM: CT ABDOMEN AND PELVIS WITHOUT CONTRAST  TECHNIQUE: Multidetector CT imaging of the abdomen and pelvis was performed following the standard protocol without intravenous contrast. Sagittal and coronal MPR images reconstructed from axial data set. Oral contrast not administered for this indication.  COMPARISON:  03/30/2013  FINDINGS: Pleural plaques at lung bases, partially calcified question asbestos exposure.  1.8 x 1.7 cm diameter cyst right kidney  image 34 stable.  Within limits of a nonenhanced exam no focal abnormalities of the liver, spleen, pancreas, kidneys, or adrenal glands otherwise identified.  Atherosclerotic calcifications aorta and iliac arteries.  No retroperitoneal hemorrhage.  Normal appendix.  Stomach decompressed, wall thickness suboptimally assessed.  Large and small bowel loops unremarkable.  Normal bladder in ureters.  Beam hardening artifacts from right hip prosthesis.  No mass, adenopathy, free fluid or inflammatory process.  Diffuse infiltrative changes identified within soft tissues at the right hip extending into right thigh.  Osseous demineralization with scattered degenerative disc and facet disease changes of the lumbar spine.  Question right inguinal hernia containing fat.  IMPRESSION: Question asbestos exposure and right inguinal hernia.  No evidence of  retroperitoneal hemorrhage.  Diffuse infiltrative changes within soft tissues of the right hip region extending into the right thigh beyond extent of this exam; differential diagnosis includes inflammatory processes, hemorrhage, trauma, deep vein thrombosis, dependent positioning, and other etiologies.   Electronically Signed   By: Lavonia Dana M.D.   On: 04/02/2013 18:43   Dg Femur Right  03/29/2013   CLINICAL DATA:  Status post fall.  Right upper leg pain.  EXAM: RIGHT FEMUR - 2 VIEW  COMPARISON:  Plain films right hip 07/24/2009.  FINDINGS: A right total hip arthroplasty is in place. The device is located. No fracture is identified. Hardware is intact.  IMPRESSION: No acute finding.  Right upper placement noted.   Electronically Signed   By: Inge Rise M.D.   On: 03/29/2013 03:42   Ct Head Wo Contrast  04/05/2013   CLINICAL DATA:  Follow-up punctate hemorrhage.  EXAM: CT HEAD WITHOUT CONTRAST  TECHNIQUE: Contiguous axial images were obtained from the base of the skull through the vertex without intravenous contrast.  COMPARISON:  04/03/2013 and 03/28/2013.  FINDINGS: New from the 03/28/2013 examination although similar to 04/03/2013 examination is a moderate size left convexity extra-axial collection with maximal thickness of 1.6 cm. This may reflect posttraumatic subdural hygroma with tiny amount of blood posterior left frontal aspect of this extra-axial collection which appears unchanged from 04/03/2013.  Mild mass effect upon the left lateral ventricle. Superior medial aspect up brain parenchyma crowding of gyri versus isodense subdural hematoma (series 2, image 29) unchanged from 04/03/2013 although new from prior exams. Attention to this on follow up.  Tiny amount of blood posterior aspect of the septum pellucidum unchanged.  Small vessel disease type changes without CT evidence of acute thrombotic infarct.  Global atrophy without hydrocephalus.  Anterior left middle cranial fossa arachnoid cyst  unchanged.  No skull fracture noted.  IMPRESSION: No significant change 04/03/2013 examination. Continued close CT monitoring recommended until posttraumatic findings have cleared. Please see above for further detail.   Electronically Signed   By: Chauncey Cruel M.D.   On: 04/05/2013 15:23   Ct Head Wo Contrast  04/03/2013   CLINICAL DATA:  Delirium, fall, R/O SDH  EXAM: CT HEAD WITHOUT CONTRAST  TECHNIQUE: Contiguous axial images were obtained from the base of the skull through the vertex without intravenous contrast.  COMPARISON:  CT C SPINE W/O CM dated 03/28/2013; CT HEAD W/O CM dated 03/28/2013; CT HEAD W/O CM dated 10/20/2009  FINDINGS: A small punctate area of increased attenuation is appreciated along the periphery of the posterior lateral cortex of the left frontal lobe image 22 series 2, measuring 4 x 6 mm. A second area of punctate increased attenuation is appreciated along the posterior aspect of the septum pellucidum on the  right image 19 series 2. These areas are consistent with punctate foci of hemorrhage which have developed in the interim when compared to prior. When compared to the previous study there has been interval development of a subdural hygroma in the left frontal region measuring 1.6 cm in diameter. A second smaller area is appreciated within the right frontal region measuring 0.73 cm in diameter. An arachnoid cyst is again appreciated within the anterior base of the left temporal fossa which appears stable. There is global atrophy. There is no evidence of subfalcine nor tonsillar herniation. Diffuse areas of low attenuation are appreciated within the subcortical, deep, and periventricular white matter regions. There is no evidence of a depressed skull fracture. The visualized paranasal sinuses and mastoid air cells are patent.  IMPRESSION: 1. Punctate areas of acute hemorrhage along the posterior-lateral cortex of the left frontal lobe and along the posterior aspect of the septum callosum  on the right. These results will be called to the ordering clinician or representative by the Radiologist Assistant, and communication documented in the PACS Dashboard. 2. Interval development of bilateral cystic hygromas. 3. Involutional and chronic changes.   Electronically Signed   By: Margaree Mackintosh M.D.   On: 04/03/2013 09:42   Ct Head Wo Contrast  03/29/2013   CLINICAL DATA:  Syncope with pain and trauma ; confusion  EXAM: CT HEAD WITHOUT CONTRAST  CT CERVICAL SPINE WITHOUT CONTRAST  TECHNIQUE: Multidetector CT imaging of the head and cervical spine was performed following the standard protocol without intravenous contrast. Multiplanar CT image reconstructions of the cervical spine were also generated.  COMPARISON:  Head CT October 20, 2009; cervical spine CT September 16, 2009  FINDINGS: CT HEAD FINDINGS  Moderate diffuse atrophy is stable. There is a stable arachnoid cyst at the level of the left sylvian fissure marriage tearing 3.2 x 3.4 x 2.6 cm. There is no other mass. There is no hemorrhage, extra-axial fluid, or midline shift. There is small vessel disease throughout the centra semiovale bilaterally. There is no demonstrable acute infarct. Bony calvarium appears intact. The mastoid air cells clear.  CT CERVICAL SPINE FINDINGS  There is no apparent fracture. There is mild anterolisthesis of C3 on C4, stable. There is mild anterolisthesis of C4 on C5, stable. There is mild anterolisthesis C5 on C6, stable. There is mild anterolisthesis of C6 on C7, stable. There is mild anterolisthesis of C7 on T1, stable. These areas of spondylolisthesis are felt to be due to underlying spondylosis and are stable. Prevertebral soft tissues and predental space regions are normal.  There is osteoarthritic change to varying degrees at all levels. No disc extrusion or stenosis. Bones are somewhat osteoporotic. There is scarring in both lung apices. Thyroid appears unremarkable.  IMPRESSION: CT head: Atrophy with  periventricular small vessel disease. Stable arachnoid cyst in the left sylvian fissure region. No acute appearing infarct. No hemorrhage or extra-axial fluid.  CT cervical spine: Extensive spondylosis. Multilevel 1 spondylolisthesis is stable and felt to be due to underlying spondylosis. No fracture.   Electronically Signed   By: Lowella Grip M.D.   On: 03/29/2013 16:32   Ct Cervical Spine Wo Contrast  03/28/2013   CLINICAL DATA:  Syncope with pain and trauma ; confusion  EXAM: CT HEAD WITHOUT CONTRAST  CT CERVICAL SPINE WITHOUT CONTRAST  TECHNIQUE: Multidetector CT imaging of the head and cervical spine was performed following the standard protocol without intravenous contrast. Multiplanar CT image reconstructions of the cervical spine were also generated.  COMPARISON:  Head CT October 20, 2009; cervical spine CT September 16, 2009  FINDINGS: CT HEAD FINDINGS  Moderate diffuse atrophy is stable. There is a stable arachnoid cyst at the level of the left sylvian fissure marriage tearing 3.2 x 3.4 x 2.6 cm. There is no other mass. There is no hemorrhage, extra-axial fluid, or midline shift. There is small vessel disease throughout the centra semiovale bilaterally. There is no demonstrable acute infarct. Bony calvarium appears intact. The mastoid air cells clear.  CT CERVICAL SPINE FINDINGS  There is no apparent fracture. There is mild anterolisthesis of C3 on C4, stable. There is mild anterolisthesis of C4 on C5, stable. There is mild anterolisthesis C5 on C6, stable. There is mild anterolisthesis of C6 on C7, stable. There is mild anterolisthesis of C7 on T1, stable. These areas of spondylolisthesis are felt to be due to underlying spondylosis and are stable. Prevertebral soft tissues and predental space regions are normal.  There is osteoarthritic change to varying degrees at all levels. No disc extrusion or stenosis. Bones are somewhat osteoporotic. There is scarring in both lung apices. Thyroid appears  unremarkable.  IMPRESSION: CT head: Atrophy with periventricular small vessel disease. Stable arachnoid cyst in the left sylvian fissure region. No acute appearing infarct. No hemorrhage or extra-axial fluid.  CT cervical spine: Extensive spondylosis. Multilevel 1 spondylolisthesis is stable and felt to be due to underlying spondylosis. No fracture.   Electronically Signed   By: Lowella Grip M.D.   On: 03/28/2013 16:57   Ct Abdomen Pelvis W Contrast  03/30/2013   CLINICAL DATA:  Fall.  Coumadin therapy.  Anemia.  EXAM: CT ABDOMEN AND PELVIS WITH CONTRAST  TECHNIQUE: Multidetector CT imaging of the abdomen and pelvis was performed using the standard protocol following bolus administration of intravenous contrast.  CONTRAST:  24mL OMNIPAQUE IOHEXOL 300 MG/ML  SOLN  COMPARISON:  MR L SPINE WO/W CM dated 12/22/2009  FINDINGS: Liver normal. Spleen normal. Pancreas normal. No biliary distention. Gallbladder is unremarkable. Portal and splenic vein patent.  Adrenals normal. Simple right renal cyst. Kidneys are otherwise unremarkable. No hydronephrosis or evidence of obstructing ureteral stone. Bladder is moderately distended. The prostate is not enlarged.  No significant adenopathy. Abdominal aorta normal in caliber. Aorto iliac and visceral vascular disease present. The visceral vessels are patent. No aneurysm. No retroperitoneal hemorrhage.  Appendix normal. No inflammatory change right or left lower quadrant. Stool present throughout the colon. No bowel distention. Stomach is nondistended. No free air. No mesenteric mass.  Heart size normal. Coronary artery disease. Bilateral pleural thickening with calcified pleural plaques are present. Prior asbestos exposure could present in this fashion. Tiny umbilical hernia and small bilateral inguinal hernias with herniation of fat only. Prominent right upper anterior thigh soft tissue swelling is noted . Hemorrhage in this region cannot be excluded. Subcutaneous edema is  also present is region. .  IMPRESSION: 1. Prominent right upper thigh soft tissue muscular soft tissue swelling and possible hemorrhage. No evidence of retroperitoneal hemorrhage. No associated fracture. The patient has a right hip replacement. 2. 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 3. Coronary artery disease.   Electronically Signed   By: Marcello Moores  Register   On: 03/30/2013 23:12   Mr Femur Right Wo Contrast  04/01/2013   CLINICAL DATA:  Right leg pain and swelling secondary to a fall. Arch proximal right anterior thigh hematoma.  EXAM: MRI OF THE RIGHT FEMUR WITHOUT CONTRAST  TECHNIQUE: Multiplanar, multisequence MR imaging was performed.  No intravenous contrast was administered.  COMPARISON:  CT scan dated 03/30/2013 and radiographs dated 03/29/2013  FINDINGS: There is a 13.5 x 10.0 x 7.0 cm intramuscular hematoma in the proximal right vastus lateralis muscle. There is edema in the periphery of the muscle due to the hematoma. There is a mass effect upon the adjacent muscles. The patient has some edema in the vastus intermedius and vastus medialis muscles due to the adjacent hematoma.  There is no underlying acute abnormality of the right femur. Right proximal femoral prosthesis is noted. There is circumferential subcutaneous edema in the proximal right thigh.  IMPRESSION: Large intramuscular hematoma in the proximal right vastus lateralis muscle with secondary edema in the muscles and subcutaneous soft tissues. This is to the expected degree considering the presence the hematoma. No evidence of muscle necrosis.   Electronically Signed   By: Rozetta Nunnery M.D.   On: 04/01/2013 09:16   Dg Chest Port 1 View  03/28/2013   CLINICAL DATA:  Syncope  EXAM: PORTABLE CHEST - 1 VIEW  COMPARISON:  July 24, 2009  FINDINGS: There are calcified pleural plaques, stable, consistent with previous asbestos exposure. There is no edema or consolidation. Heart is borderline prominent with normal  pulmonary vascularity. No adenopathy. There is atherosclerotic change in the aorta. There is degenerative change in the thoracic spine.  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 No edema or consolidation.   Electronically Signed   By: Lowella Grip M.D.   On: 03/28/2013 16:48    History of Present Illness: Pt was admitted after a fall. He was found to have a large R leg hematoma and developing anemia   Hospital Course: Acute blood loss Anemia - coumadin was stopped. INR reversed. He received 4U PRBC. Hgb now remains at his baseline of 9-10. He suffers from anemia of chronic dz that is followed by Dr Elson Areas. The source of acute anemia was hematoma in R flank/leg. GI was consulted that r/o'd GI bleed. CT A/P r/o'd retroperitoneal bleed. Heme was consulted that recommended continued follow up as an outpatient to monitor his anemia of chronic disease. They set up a visit w/ him this Wednesday for close follow up.   Falls / Deconditioning - Coumadin for his a fib was stopped indefinitely b/c of repeated falls and cerebral hemorrhage as a result. Dr Einar Gip agrees w/ this plan. He will follow up w/ Dr Einar Gip this week. He is being placed in rehab for his deconditioning.   Punctate cerebral hemorrhage - noted on CT head 3/21. Repeat CT 3/23 noted stablity. No futher f/u needed. INR now normalized off coumadin . NSG does not feel this is the cause of his symptoms   B/L cystic hygromas vs SDH - stable b/w CT head 3/21 and 3/23, but would repeat head CT in 1 week to ensure continued stability.   Anemia of chronic dz - held iron per heme recs. Has f/u w/ Heme after d/c . Back to baseline Hgb    A fib - rate controlled on home meds. Coumadin stopped. Dr Einar Gip follow up as outpt   AoCKI - resolved w/ IVFs   Delerium 2/2 alcohol withdrawal - noted to hallucinate initially but this completely resolved x 48 hours prior to discharge and CIWA had been discontinued during this  time frame w/o incident. He was educated on alcohol cessation. No further monitoring needed as he appears stable       Day of Discharge Exam BP 110/52  Pulse 69  Temp(Src) 98.3 F (36.8 C) (Oral)  Resp 18  Ht 5\' 11"  (1.803 m)  Wt 77.1 kg (169 lb 15.6 oz)  BMI 23.72 kg/m2  SpO2 99%  Physical Exam: General appearance: WM in NAD  Eyes: no scleral icterus  Throat: oropharynx moist without erythema  Resp: CTAB, no wheezing/rales  Cardio: RRR, no MRG  GI: soft, non-tender; bowel sounds normal; no masses, no organomegaly  Extremities: edema of R thigh and knee w/o ecchymosis  Skin: ecchymosis on R neck and R flank/hip   Discharge Labs:  Recent Labs  04/04/13 0321 04/05/13 0248  NA 135* 134*  K 4.1 3.4*  CL 97 97  CO2 24 24  GLUCOSE 98 111*  BUN 21 30*  CREATININE 1.12 1.27  CALCIUM 9.4 8.7   No results found for this basename: AST, ALT, ALKPHOS, BILITOT, PROT, ALBUMIN,  in the last 72 hours  Recent Labs  04/05/13 0248 04/06/13 0615  WBC 12.0* 9.3  HGB 9.8* 9.7*  HCT 28.8* 28.4*  MCV 88.9 90.7  PLT 290 293   Lab Results  Component Value Date   INR 1.16 04/06/2013   INR 1.09 04/05/2013   INR 1.08 04/04/2013   No results found for this basename: CKTOTAL, CKMB, CKMBINDEX, TROPONINI,  in the last 72 hours No results found for this basename: TSH, T4TOTAL, FREET3, T3FREE, THYROIDAB,  in the last 72 hours No results found for this basename: VITAMINB12, FOLATE, FERRITIN, TIBC, IRON, RETICCTPCT,  in the last 72 hours  Discharge instructions:     Discharge Orders   Future Appointments Provider Department Dept Phone   04/07/2013 10:15 AM [facility] [phone]   04/07/2013 10:45 AM Heath Lark, MD [facility] [phone]   Future Orders Complete By Expires   Diet - low sodium heart healthy  As directed    Increase activity slowly  As directed       Dr Adrian Prows on on 3/26 at  1145   Disposition: SNF   Follow-up Appts: Follow-up with Dr. Ardeth Perfect at [facility] after discharge from SNF.  Call for appointment.  Condition on Discharge: stable   Tests Needing Follow-up:order CT head w/o contrast on 04/12/13  Time spent in discharge (includes decision making & examination of pt): 45 minutes    Signed: Clemente Dewey 04/06/2013, 7:41 AM

## 2013-04-06 NOTE — Progress Notes (Addendum)
Update: CSW spoke to PT and PT will see patient as soon as they are able to.  CSW has paged PT assigned to treatment team multiple times. Patient is not approved from insurance until there is an updated PT note sent to [facility]. CSW awaiting PT to see patient before insurance will be approved to dc patient to snf.  Jeanette Caprice, MSW, [place]

## 2013-04-06 NOTE — Progress Notes (Signed)
D/c instructions were reviewed with pt at 1510 today, copy of instructions given to pt and copy was placed in pt packet for SNF. Pt was picked up at this time by [facility] and transported to [facility] SNF. Pt took his belongings with him upon discharging with transporters.

## 2013-04-06 NOTE — Progress Notes (Addendum)
CSW received auth number. Clinical Social Worker facilitated patient discharge by contacting the patient, family (patient;s wife over the phone) and facility, [facility].. Patient's wife agreeable to this plan and arranging transport via EMS . CSW will sign off, as social work intervention is no longer needed.  Jeanette Caprice, MSW, [place]

## 2013-04-07 ENCOUNTER — Other Ambulatory Visit: Payer: Medicare Other

## 2013-04-07 ENCOUNTER — Ambulatory Visit: Payer: Medicare Other | Admitting: Hematology and Oncology

## 2013-04-08 ENCOUNTER — Encounter (INDEPENDENT_AMBULATORY_CARE_PROVIDER_SITE_OTHER): Payer: Medicare Other | Admitting: Ophthalmology

## 2013-04-08 DIAGNOSIS — H353290 Exudative age-related macular degeneration, unspecified eye, stage unspecified: Secondary | ICD-10-CM

## 2013-04-08 DIAGNOSIS — H43819 Vitreous degeneration, unspecified eye: Secondary | ICD-10-CM

## 2013-04-09 ENCOUNTER — Other Ambulatory Visit (HOSPITAL_COMMUNITY): Payer: Self-pay | Admitting: Endocrinology

## 2013-04-09 DIAGNOSIS — I611 Nontraumatic intracerebral hemorrhage in hemisphere, cortical: Secondary | ICD-10-CM

## 2013-04-12 ENCOUNTER — Ambulatory Visit (HOSPITAL_COMMUNITY)
Admission: RE | Admit: 2013-04-12 | Discharge: 2013-04-12 | Disposition: A | Discharge status: Home / Self Care | Payer: Medicare Other | Source: Ambulatory Visit | Attending: Endocrinology | Admitting: Endocrinology

## 2013-04-12 ENCOUNTER — Encounter (HOSPITAL_COMMUNITY): Payer: Self-pay

## 2013-04-12 DIAGNOSIS — W19XXXA Unspecified fall, initial encounter: Secondary | ICD-10-CM | POA: Insufficient documentation

## 2013-04-12 DIAGNOSIS — I611 Nontraumatic intracerebral hemorrhage in hemisphere, cortical: Secondary | ICD-10-CM

## 2013-04-12 DIAGNOSIS — I629 Nontraumatic intracranial hemorrhage, unspecified: Secondary | ICD-10-CM | POA: Insufficient documentation

## 2013-04-19 ENCOUNTER — Encounter: Payer: Self-pay | Admitting: Gastroenterology

## 2013-04-21 ENCOUNTER — Telehealth: Payer: Self-pay | Admitting: Gastroenterology

## 2013-04-21 NOTE — Telephone Encounter (Signed)
Spoke with Dr. Lodema Hong he would like the patient seen earlier to discuss capsule endo.  He is scheduled to see Nicoletta Ba PA on 04/26/13 2:00

## 2013-04-26 ENCOUNTER — Encounter: Payer: Self-pay | Admitting: Physician Assistant

## 2013-04-26 ENCOUNTER — Ambulatory Visit (INDEPENDENT_AMBULATORY_CARE_PROVIDER_SITE_OTHER): Payer: Medicare Other | Admitting: Physician Assistant

## 2013-04-26 VITALS — BP 140/68 | HR 72 | Ht 69.0 in | Wt 173.0 lb

## 2013-04-26 DIAGNOSIS — D638 Anemia in other chronic diseases classified elsewhere: Secondary | ICD-10-CM

## 2013-04-26 NOTE — Patient Instructions (Signed)
Get your Hemoglobin blood test done every 2 weeks for the next 2 months at [facility], Dr. Hinton Dyer office.

## 2013-04-27 ENCOUNTER — Encounter: Payer: Self-pay | Admitting: Physician Assistant

## 2013-04-27 NOTE — Progress Notes (Signed)
Subjective:    Patient ID: Ricardo Allen, male    DOB: 1925-12-30, 80 y.o.   MRN: [id number]  HPI  Ricardo Allen is an 80 year old white male known to Dr. Deatra Ina. He has history of iron deficiency anemia and what is felt to be an anemia of chronic disease. He has history of sleep apnea, restless leg syndrome, atrial fibrillation, previous history of a melanoma, dementia, hyperlipidemia,  and  chronic renal insufficiency. He had been on chronic anticoagulation with atrial fibrillation however had a recent hospital admission in March of 2015 with a syncopal episode which was complicated by a hematoma in his right thigh and a significant drop in his hemoglobin of 3-4 g. He was seen by GI during that admission and also by hematology/Dr. [place] was made to hold his anticoagulation and he has been off since. He did require transfusions. At that time he was Hemoccult-negative and was not felt that he required any further GI intervention. He had also been on iron supplementation and his ferritin level was high in iron was discontinued.  Since discharge his hemoglobin has remained fairly stable and was 9.3 on 04/21/2013. Patient had a brief stay at [facility]  after his admission and apparently had a stool done for Hemoccult one time while there which was Hemoccult-positive.. Today he has no GI complaints, specifically no melena hematochezia changes in his bowel habits or abdominal pain. He does mention that he is been having some discomfort from a cracked tooth in his mouth which does bleed occasionally.  He had undergone previous workup for iron deficiency anemia in June of 2014 with EGD which was normal and colonoscopy per Dr. Deatra Ina which showed diverticulosis and was otherwise negative  He is referred back to GI today by his PCP Dr. Allie Bossier for consideration of capsule endoscopy because of the finding of Hemoccult-positive stool.    Review of Systems  Constitutional: Positive for fatigue.  HENT:  Negative.   Eyes: Negative.   Respiratory: Negative.   Cardiovascular: Negative.   Endocrine: Negative.   Genitourinary: Negative.   Musculoskeletal: Positive for gait problem.  Skin: Negative.   Allergic/Immunologic: Negative.   Neurological: Positive for weakness.  Hematological: Negative.   Psychiatric/Behavioral: Negative.    Outpatient Prescriptions Prior to Visit  Medication Sig Dispense Refill  . allopurinol (ZYLOPRIM) 100 MG tablet Take 1 tablet (100 mg total) by mouth daily.  30 tablet  5  . amiodarone (PACERONE) 200 MG tablet Take 1 tablet (200 mg total) by mouth daily.  30 tablet  5  . bisacodyl (DULCOLAX) 5 MG EC tablet Take 1 tablet (5 mg total) by mouth every morning.  30 tablet  0  . citalopram (CELEXA) 20 MG tablet Take 1 tablet (20 mg total) by mouth daily.  30 tablet  5  . Cobalamine Combinations (B-6 FOLIC ACID) 008-6761-95 MCG-MCG-MG CAPS Take 1 capsule by mouth daily.  30 each  11  . colchicine 0.6 MG tablet Take 0.6 mg by mouth daily as needed (for gout).      Marland Kitchen docusate sodium 100 MG CAPS Take 100 mg by mouth 2 (two) times daily.  60 capsule  5  . HYDROcodone-acetaminophen (NORCO) 10-325 MG per tablet Take 1 tablet by mouth every 6 (six) hours as needed.  30 tablet  0  . metoprolol tartrate (LOPRESSOR) 25 MG tablet Take 1 tablet (25 mg total) by mouth 2 (two) times daily.  60 tablet  5  . morphine (MS CONTIN) 30 MG  12 hr tablet Take 1 tablet (30 mg total) by mouth every 12 (twelve) hours.  30 tablet  0  . pantoprazole (PROTONIX) 40 MG tablet Take 1 tablet (40 mg total) by mouth daily at 6 (six) AM.  30 tablet  5  . polyethylene glycol (MIRALAX / GLYCOLAX) packet Take 17 g by mouth daily as needed (constipation).      . Probiotic Product (PROBIOTIC DAILY PO) Take 1 capsule by mouth daily.       Marland Kitchen alendronate (FOSAMAX) 70 MG tablet Take 70 mg by mouth every Sunday. Take with a full glass of water on an empty stomach.       No facility-administered medications prior  to visit.   Allergies  Allergen Reactions  . Fentanyl     REACTION: nausea and depression  . Hydromorphone Hcl     REACTION: depressed and nausea  . Sulfonamide Derivatives     REACTION: hives   Patient Active Problem List   Diagnosis Date Noted  . Intracranial hemorrhage 04/04/2013  . Subdural hygroma 04/04/2013  . Night terrors, adult 08/03/2012  . Restless leg syndrome 08/03/2012  . Diverticulosis of colon (without mention of hemorrhage) 06/21/2012  . Anemia 06/18/2012    Class: Acute  . GI bleed 06/18/2012    Class: Chronic  . Atrial fibrillation 11/27/2009  . HYPOTENSION, ORTHOSTATIC 10/31/2009  . OTHER SPECIFIED IRON DEFICIENCY ANEMIAS 09/05/2009  . TRAUMATIC ARTHROPATHY PELVIC REGION AND THIGH 06/29/2009  . CERUMEN IMPACTION, BILATERAL 05/26/2009  . GOUT 03/22/2009  . NAUSEA 03/08/2009  . LUNG NODULE 03/29/2008  . WEIGHT LOSS, ABNORMAL 03/22/2008  . ABDOMINAL PAIN, EPIGASTRIC 03/22/2008  . LOW BACK PAIN 02/03/2007  . LUMBAR RADICULOPATHY, LEFT 10/09/2006  . DM W/MANIFESTATION NEC, TYPE II, UNCONTROLLED 08/27/2006  . HYPERLIPIDEMIA 08/27/2006  . DEMENTIA 08/27/2006  . DEPRESSION 08/27/2006  . MELANOMA, MALIGNANT, TRUNK 08/19/2006  . ANEMIA NOS 08/19/2006  . Complex sleep apnea syndrome 08/19/2006   History  Substance Use Topics  . Smoking status: Former Smoker -- 2.00 packs/day for 60 years    Types: Cigarettes    Quit date: 04/14/2008  . Smokeless tobacco: Never Used  . Alcohol Use: 4.4 oz/week    4 Glasses of wine, 4 Drinks containing 0.5 oz of alcohol per week   family history includes Arthritis in his brother; Cancer in his father, maternal aunt, and mother; Pancreatic cancer in his mother; Testicular cancer in his father.     Objective:   Physical Exam And and and and white male in no acute distress, he ambulates with a walker. Blood pressure 140/68 pulse 72 height 5 foot 9 weight 173. HEENT; nontraumatic normocephalic EOMI PERRLA sclera anicteric,  Supple; no JVD, Cardiovascular; regular rate and rhythm with S1-S2 no murmur or gallop, Pulmonary; clear bilaterally, Abdomen; soft nontender nondistended bowel sounds are active there is no palpable mass or hepatosplenomegaly, Rectal; exam no external lesion noted stool is brown and Hemoccult negative, Extremities; no clubbing cyanosis or edema skin warm and dry, Psych; mood and affect appropriate       Assessment & Plan:  #4  80 year old male with history of acute on chronic anemia felt to be  anemia of chronic disease. Most recent admission with 3-4 g drop in his hemoglobin was attributed to a bleed into his right thigh with large hematoma after a fall while on Coumadin. Hemoglobin stable since discharge from the hospital on patient is now off of anticoagulation. He apparently had one stool documented Hemoccult positive while in  rehabilitation but is Hemoccult-negative today. I am not convinced that he has a GI source for his anemia #2 history of atrial fibrillation-off anticoagulation as of most recent admission #3 chronic kidney disease #4 dementia #5 diabetes #6 sleep apnea #7 diverticulosis #8 recurrent falls  Plan; discuss possible capsule endoscopy with the patient, he is not anxious to proceed with any further testing and a do not feel strongly that he needs to undergo capsule endoscopy as stool is currently heme-negative, and most recent drop in hemoglobin was documented to be secondary to a hematoma while on Coumadin. His anticoagulation has since been stopped and hemoglobin has been stable since discharge from the hospital. For now would continue serial hemoglobins q. 2 weeks and should he demonstrate a significant drop in his hemoglobin off of anticoagulation then can reconsider capsule endoscopy. Given his advanced age and multiple comorbidities, any findings on capsule endoscopy may not change our overall management.

## 2013-04-29 NOTE — Progress Notes (Signed)
Reviewed and agree with management. Cart[place] D. Saffron Busey, M.D., FACG  

## 2013-04-30 ENCOUNTER — Telehealth: Payer: Self-pay | Admitting: *Deleted

## 2013-04-30 ENCOUNTER — Telehealth: Payer: Self-pay | Admitting: Hematology and Oncology

## 2013-04-30 NOTE — Telephone Encounter (Signed)
Call from [place], [place] at Dr. Hoover Brunette office.  Dr. Ardeth Perfect wants pt to see Dr. Alvy Bimler again soon for Hgb today of 9.4.  They request appt date of 5/06, 5/07 or 5/08 if possible. Alisa requests call back to ph [phone] with date/time of appt.

## 2013-04-30 NOTE — Telephone Encounter (Signed)
called Alicia @ [phone] and sched pt appt...she will notify pt

## 2013-05-04 ENCOUNTER — Ambulatory Visit: Payer: Medicare Other | Admitting: Gastroenterology

## 2013-05-06 ENCOUNTER — Encounter (INDEPENDENT_AMBULATORY_CARE_PROVIDER_SITE_OTHER): Payer: Medicare Other | Admitting: Ophthalmology

## 2013-05-06 DIAGNOSIS — H43819 Vitreous degeneration, unspecified eye: Secondary | ICD-10-CM

## 2013-05-06 DIAGNOSIS — H353290 Exudative age-related macular degeneration, unspecified eye, stage unspecified: Secondary | ICD-10-CM

## 2013-05-25 ENCOUNTER — Encounter: Payer: Self-pay | Admitting: Hematology and Oncology

## 2013-05-25 ENCOUNTER — Other Ambulatory Visit (HOSPITAL_BASED_OUTPATIENT_CLINIC_OR_DEPARTMENT_OTHER): Payer: Medicare Other

## 2013-05-25 ENCOUNTER — Ambulatory Visit (HOSPITAL_BASED_OUTPATIENT_CLINIC_OR_DEPARTMENT_OTHER): Payer: Medicare Other | Admitting: Hematology and Oncology

## 2013-05-25 VITALS — BP 105/47 | HR 64 | Temp 98.2°F | Resp 17 | Ht 69.0 in | Wt 170.2 lb

## 2013-05-25 DIAGNOSIS — D649 Anemia, unspecified: Secondary | ICD-10-CM

## 2013-05-25 DIAGNOSIS — C4359 Malignant melanoma of other part of trunk: Secondary | ICD-10-CM

## 2013-05-25 DIAGNOSIS — Z85820 Personal history of malignant melanoma of skin: Secondary | ICD-10-CM

## 2013-05-25 DIAGNOSIS — D508 Other iron deficiency anemias: Secondary | ICD-10-CM

## 2013-05-25 DIAGNOSIS — N189 Chronic kidney disease, unspecified: Secondary | ICD-10-CM

## 2013-05-25 LAB — CBC & DIFF AND RETIC
BASO%: 0.3 % (ref 0.0–2.0)
Basophils Absolute: 0 10*3/uL (ref 0.0–0.1)
EOS%: 4.1 % (ref 0.0–7.0)
Eosinophils Absolute: 0.3 10*3/uL (ref 0.0–0.5)
HCT: 33.3 % — ABNORMAL LOW (ref 38.4–49.9)
HGB: 10.4 g/dL — ABNORMAL LOW (ref 13.0–17.1)
IMMATURE RETIC FRACT: 5 % (ref 3.00–10.60)
LYMPH#: 1.6 10*3/uL (ref 0.9–3.3)
LYMPH%: 20.2 % (ref 14.0–49.0)
MCH: 30.7 pg (ref 27.2–33.4)
MCHC: 31.2 g/dL — AB (ref 32.0–36.0)
MCV: 98.2 fL — ABNORMAL HIGH (ref 79.3–98.0)
MONO#: 0.6 10*3/uL (ref 0.1–0.9)
MONO%: 7.8 % (ref 0.0–14.0)
NEUT#: 5.2 10*3/uL (ref 1.5–6.5)
NEUT%: 67.6 % (ref 39.0–75.0)
Platelets: 219 10*3/uL (ref 140–400)
RBC: 3.39 10*6/uL — ABNORMAL LOW (ref 4.20–5.82)
RDW: 14.8 % — AB (ref 11.0–14.6)
RETIC CT ABS: 63.73 10*3/uL (ref 34.80–93.90)
Retic %: 1.88 % — ABNORMAL HIGH (ref 0.80–1.80)
WBC: 7.7 10*3/uL (ref 4.0–10.3)

## 2013-05-25 LAB — COMPREHENSIVE METABOLIC PANEL (CC13)
ALT: 10 U/L (ref 0–55)
ANION GAP: 11 meq/L (ref 3–11)
AST: 17 U/L (ref 5–34)
Albumin: 3.4 g/dL — ABNORMAL LOW (ref 3.5–5.0)
Alkaline Phosphatase: 79 U/L (ref 40–150)
BILIRUBIN TOTAL: 0.52 mg/dL (ref 0.20–1.20)
BUN: 24.4 mg/dL (ref 7.0–26.0)
CO2: 25 mEq/L (ref 22–29)
CREATININE: 1.6 mg/dL — AB (ref 0.7–1.3)
Calcium: 9.4 mg/dL (ref 8.4–10.4)
Chloride: 104 mEq/L (ref 98–109)
Glucose: 130 mg/dl (ref 70–140)
Potassium: 4.6 mEq/L (ref 3.5–5.1)
Sodium: 141 mEq/L (ref 136–145)
Total Protein: 6.8 g/dL (ref 6.4–8.3)

## 2013-05-25 LAB — HOLD TUBE, BLOOD BANK

## 2013-05-25 NOTE — Progress Notes (Signed)
[facility] OFF[facility] PROGRESS NOTE  Velna Hatchet, MD DIAGNOSIS:  Anemia of chronic disease  SUMMARY OF HEMATOLOGIC HISTORY: This is a pleasant 53 your gentleman with background history of chronic kidney disease and anemia chronic disease. In June of 2014, he had bone marrow aspirate and biopsy which rule out malignancy. He also had history of melanoma resected in 2006 but have not seen a dermatologist for long time. On 03/31/2013, I was consulted in the hospital. The patient presented with trauma with acute drop in his hemoglobin. The patient received 2 units of blood transfusion. There is abnormal changes in his right thigh consistent with hematoma. His anticoagulation therapy was subsequently discontinued.  INTERVAL HISTORY: Ricardo Allen 80 y.o. male returns for further followup. His leg pain has improved. He denies any chest pain, shortness of breath or dizziness. He denies recent bleeding.  I have reviewed the past medical history, past surgical history, social history and family history with the patient and they are unchanged from previous note.  ALLERGIES:  is allergic to fentanyl; hydromorphone hcl; and sulfonamide derivatives.  MEDICATIONS:  Current Outpatient Prescriptions  Medication Sig Dispense Refill  . allopurinol (ZYLOPRIM) 100 MG tablet Take 1 tablet (100 mg total) by mouth daily.  30 tablet  5  . amiodarone (PACERONE) 200 MG tablet Take 1 tablet (200 mg total) by mouth daily.  30 tablet  5  . bisacodyl (DULCOLAX) 5 MG EC tablet Take 1 tablet (5 mg total) by mouth every morning.  30 tablet  0  . citalopram (CELEXA) 20 MG tablet Take 1 tablet (20 mg total) by mouth daily.  30 tablet  5  . Cobalamine Combinations (B-6 FOLIC ACID) 295-2841-32 MCG-MCG-MG CAPS Take 1 capsule by mouth daily.  30 each  11  . colchicine 0.6 MG tablet Take 0.6 mg by mouth daily as needed (for gout).      Marland Kitchen docusate sodium 100 MG CAPS Take 100 mg by mouth 2 (two) times daily.  60  capsule  5  . HYDROcodone-acetaminophen (NORCO) 10-325 MG per tablet Take 1 tablet by mouth every 6 (six) hours as needed.  30 tablet  0  . metoprolol tartrate (LOPRESSOR) 25 MG tablet Take 1 tablet (25 mg total) by mouth 2 (two) times daily.  60 tablet  5  . morphine (MS CONTIN) 30 MG 12 hr tablet Take 1 tablet (30 mg total) by mouth every 12 (twelve) hours.  30 tablet  0  . pantoprazole (PROTONIX) 40 MG tablet Take 1 tablet (40 mg total) by mouth daily at 6 (six) AM.  30 tablet  5  . polyethylene glycol (MIRALAX / GLYCOLAX) packet Take 17 g by mouth daily as needed (constipation).      . Probiotic Product (PROBIOTIC DAILY PO) Take 1 capsule by mouth daily.        No current facility-administered medications for this visit.     REVIEW OF SYSTEMS:   Constitutional: Denies fevers, chills or night sweats Eyes: Denies blurriness of vision Ears, nose, mouth, throat, and face: Denies mucositis or sore throat Respiratory: Denies cough, dyspnea or wheezes Cardiovascular: Denies palpitation, chest discomfort or lower extremity swelling Gastrointestinal:  Denies nausea, heartburn or change in bowel habits Skin: Denies abnormal skin rashes Lymphatics: Denies new lymphadenopathy or easy bruising Neurological:Denies numbness, tingling or new weaknesses Behavioral/Psych: Mood is stable, no new changes  All other systems were reviewed with the patient and are negative.  PHYSICAL EXAMINATION: ECOG PERFORMANCE STATUS: 1 - Symptomatic but  completely ambulatory  Filed Vitals:   05/25/13 1102  BP: 105/47  Pulse: 64  Temp: 98.2 F (36.8 C)  Resp: 17   Filed Weights   05/25/13 1102  Weight: 170 lb 3.2 oz (77.202 kg)    GENERAL:alert, no distress and comfortable. He looks pale  SKIN: skin color, texture, turgor are normal, no rashes or significant lesions. Nodes skin bruises  EYES: normal, Conjunctiva are pink and non-injected, sclera clear OROPHARYNX:no exudate, no erythema and lips, buccal  mucosa, and tongue normal  NECK: supple, thyroid normal size, non-tender, without nodularity LYMPH:  no palpable lymphadenopathy in the cervical, axillary or inguinal LUNGS: clear to auscultation and percussion with normal breathing effort HEART: regular rate & rhythm and no murmurs  with mild bilateral  lower extremity edema ABDOMEN:abdomen soft, non-tender and normal bowel sounds Musculoskeletal:no cyanosis of digits and no clubbing  NEURO: alert & oriented x 3 with fluent speech, no focal motor/sensory deficits  LABORATORY DATA:  I have reviewed the data as listed Results for orders placed in visit on 05/25/13 (from the past 48 hour(s))  CBC & DIFF AND RETIC     Status: Abnormal   Collection Time    05/25/13 10:22 AM      Result Value Ref Range   WBC 7.7  4.0 - 10.3 10e3/uL   NEUT# 5.2  1.5 - 6.5 10e3/uL   HGB 10.4 (*) 13.0 - 17.1 g/dL   HCT 33.3 (*) 38.4 - 49.9 %   Platelets 219  140 - 400 10e3/uL   MCV 98.2 (*) 79.3 - 98.0 fL   MCH 30.7  27.2 - 33.4 pg   MCHC 31.2 (*) 32.0 - 36.0 g/dL   RBC 3.39 (*) 4.20 - 5.82 10e6/uL   RDW 14.8 (*) 11.0 - 14.6 %   lymph# 1.6  0.9 - 3.3 10e3/uL   MONO# 0.6  0.1 - 0.9 10e3/uL   Eosinophils Absolute 0.3  0.0 - 0.5 10e3/uL   Basophils Absolute 0.0  0.0 - 0.1 10e3/uL   NEUT% 67.6  39.0 - 75.0 %   LYMPH% 20.2  14.0 - 49.0 %   MONO% 7.8  0.0 - 14.0 %   EOS% 4.1  0.0 - 7.0 %   BASO% 0.3  0.0 - 2.0 %   Retic % 1.88 (*) 0.80 - 1.80 %   Retic Ct Abs 63.73  34.80 - 93.90 10e3/uL   Immature Retic Fract 5.00  3.00 - 10.60 %  HOLD TUBE, BLOOD BANK     Status: None   Collection Time    05/25/13 10:22 AM      Result Value Ref Range   Hold Tube, Blood Bank Blood Bank Order Cancelled per Cameo    SEDIMENTATION RATE     Status: Abnormal (Preliminary result)   Collection Time    05/25/13 10:22 AM      Result Value Ref Range   Sed Rate 34 (*) 0 - 16 mm/hr  COMPREHENSIVE METABOLIC PANEL (VQ25)     Status: Abnormal   Collection Time    05/25/13 10:23  AM      Result Value Ref Range   Sodium 141  136 - 145 mEq/L   Potassium 4.6  3.5 - 5.1 mEq/L   Chloride 104  98 - 109 mEq/L   CO2 25  22 - 29 mEq/L   Glucose 130  70 - 140 mg/dl   BUN 24.4  7.0 - 26.0 mg/dL   Creatinine 1.6 (*) 0.7 - 1.3  mg/dL   Total Bilirubin 0.52  0.20 - 1.20 mg/dL   Alkaline Phosphatase 79  40 - 150 U/L   AST 17  5 - 34 U/L   ALT 10  0 - 55 U/L   Total Protein 6.8  6.4 - 8.3 g/dL   Albumin 3.4 (*) 3.5 - 5.0 g/dL   Calcium 9.4  8.4 - 10.4 mg/dL   Anion Gap 11  3 - 11 mEq/L    Lab Results  Component Value Date   WBC 7.7 05/25/2013   HGB 10.4* 05/25/2013   HCT 33.3* 05/25/2013   MCV 98.2* 05/25/2013   PLT 219 05/25/2013   ASSESSMENT & PLAN:  #1 anemia This is likely anemia of chronic disease. The patient denies recent history of bleeding such as epistaxis, hematuria or hematochezia. He is asymptomatic from the anemia. We will observe for now.  He does not require transfusion now.  He developed hematoma after a fall recently but overall his anemia is improving. #2 chronic kidney disease The patient to continue monitoring her primary care provider #3 iron overload Recommended patient to discontinue iron supplement #4 history of melanoma Staging is unknown. Clinically, he has no evidence of recurrence.  All questions were answered. The patient knows to call the clinic with any problems, questions or concerns. No barriers to learning was detected.  I spent 15 minutes counseling the patient face to face. The total time spent in the appointment was 20 minutes and more than 50% was on counseling.     Heath Lark, MD 05/25/2013 9:11 PM

## 2013-05-27 LAB — SEDIMENTATION RATE: SED RATE: 34 mm/h — AB (ref 0–16)

## 2013-05-27 LAB — ERYTHROPOIETIN: Erythropoietin: 12.1 m[IU]/mL (ref 2.6–18.5)

## 2013-05-29 ENCOUNTER — Telehealth: Payer: Self-pay | Admitting: Hematology and Oncology

## 2013-05-29 NOTE — Telephone Encounter (Signed)
lvm for pt regarding to Aug appt...mailed avs and letter

## 2013-06-03 ENCOUNTER — Other Ambulatory Visit: Payer: Self-pay | Admitting: Dermatology

## 2013-06-03 ENCOUNTER — Encounter (INDEPENDENT_AMBULATORY_CARE_PROVIDER_SITE_OTHER): Payer: Medicare Other | Admitting: Ophthalmology

## 2013-06-03 DIAGNOSIS — H43819 Vitreous degeneration, unspecified eye: Secondary | ICD-10-CM

## 2013-06-03 DIAGNOSIS — H353290 Exudative age-related macular degeneration, unspecified eye, stage unspecified: Secondary | ICD-10-CM

## 2013-06-09 ENCOUNTER — Ambulatory Visit: Payer: Medicare Other | Admitting: Gastroenterology

## 2013-07-05 ENCOUNTER — Encounter (INDEPENDENT_AMBULATORY_CARE_PROVIDER_SITE_OTHER): Payer: Medicare Other | Admitting: Ophthalmology

## 2013-07-05 DIAGNOSIS — H353290 Exudative age-related macular degeneration, unspecified eye, stage unspecified: Secondary | ICD-10-CM

## 2013-07-05 DIAGNOSIS — H43819 Vitreous degeneration, unspecified eye: Secondary | ICD-10-CM

## 2013-08-02 ENCOUNTER — Encounter (INDEPENDENT_AMBULATORY_CARE_PROVIDER_SITE_OTHER): Payer: Medicare Other | Admitting: Ophthalmology

## 2013-08-02 DIAGNOSIS — H43819 Vitreous degeneration, unspecified eye: Secondary | ICD-10-CM

## 2013-08-02 DIAGNOSIS — H353290 Exudative age-related macular degeneration, unspecified eye, stage unspecified: Secondary | ICD-10-CM

## 2013-08-04 ENCOUNTER — Encounter: Payer: Self-pay | Admitting: Gastroenterology

## 2013-08-04 ENCOUNTER — Ambulatory Visit (INDEPENDENT_AMBULATORY_CARE_PROVIDER_SITE_OTHER): Payer: Medicare Other | Admitting: Gastroenterology

## 2013-08-04 VITALS — BP 150/64 | HR 64 | Ht 69.0 in | Wt 176.2 lb

## 2013-08-04 DIAGNOSIS — D508 Other iron deficiency anemias: Secondary | ICD-10-CM

## 2013-08-04 NOTE — Assessment & Plan Note (Signed)
Patient has history of an iron deficiency anemia and Hemoccult-positive stool.  At present he is Hemoccult negative.  Prior GI workup in 2014 including endoscopy and colonoscopy were not diagnostic.  Recommendations #1 no further GI workup in the face of a stable hemoglobin

## 2013-08-04 NOTE — Patient Instructions (Signed)
Follow up as needed

## 2013-08-04 NOTE — Progress Notes (Signed)
          History of Present Illness:  The patient has returned for followup of Hemoccult-positive stool and anemia.  Followup hemoglobin is stable.  He tested Hemoccult negative.  He has no GI complaints including rectal bleeding, abdominal pain or change of bowel habits.    Review of Systems: Pertinent positive and negative review of systems were noted in the above HPI section. All other review of systems were otherwise negative.    Current Medications, Allergies, Past Medical History, Past Surgical History, Family History and Social History were reviewed in [place] record  Vital signs were reviewed in today's medical record. Physical Exam: General: Well developed , well nourished, no acute distress Skin: anicteric  See Assessment and Plan under Problem List

## 2013-08-25 ENCOUNTER — Ambulatory Visit (HOSPITAL_BASED_OUTPATIENT_CLINIC_OR_DEPARTMENT_OTHER): Payer: Medicare Other | Admitting: Hematology and Oncology

## 2013-08-25 ENCOUNTER — Telehealth: Payer: Self-pay | Admitting: *Deleted

## 2013-08-25 ENCOUNTER — Other Ambulatory Visit (HOSPITAL_BASED_OUTPATIENT_CLINIC_OR_DEPARTMENT_OTHER): Payer: Medicare Other

## 2013-08-25 ENCOUNTER — Encounter: Payer: Self-pay | Admitting: Hematology and Oncology

## 2013-08-25 VITALS — BP 137/65 | HR 72 | Temp 98.2°F | Resp 18 | Ht 69.0 in | Wt 179.5 lb

## 2013-08-25 DIAGNOSIS — N039 Chronic nephritic syndrome with unspecified morphologic changes: Secondary | ICD-10-CM

## 2013-08-25 DIAGNOSIS — N189 Chronic kidney disease, unspecified: Secondary | ICD-10-CM

## 2013-08-25 DIAGNOSIS — I482 Chronic atrial fibrillation, unspecified: Secondary | ICD-10-CM

## 2013-08-25 DIAGNOSIS — D508 Other iron deficiency anemias: Secondary | ICD-10-CM

## 2013-08-25 DIAGNOSIS — D631 Anemia in chronic kidney disease: Secondary | ICD-10-CM

## 2013-08-25 DIAGNOSIS — I4891 Unspecified atrial fibrillation: Secondary | ICD-10-CM

## 2013-08-25 DIAGNOSIS — N182 Chronic kidney disease, stage 2 (mild): Secondary | ICD-10-CM

## 2013-08-25 LAB — CBC WITH DIFFERENTIAL/PLATELET
BASO%: 0.6 % (ref 0.0–2.0)
Basophils Absolute: 0 10*3/uL (ref 0.0–0.1)
EOS ABS: 0.3 10*3/uL (ref 0.0–0.5)
EOS%: 4.4 % (ref 0.0–7.0)
HCT: 32.4 % — ABNORMAL LOW (ref 38.4–49.9)
HGB: 10.4 g/dL — ABNORMAL LOW (ref 13.0–17.1)
LYMPH%: 18.5 % (ref 14.0–49.0)
MCH: 30.8 pg (ref 27.2–33.4)
MCHC: 32.1 g/dL (ref 32.0–36.0)
MCV: 96 fL (ref 79.3–98.0)
MONO#: 0.7 10*3/uL (ref 0.1–0.9)
MONO%: 9.8 % (ref 0.0–14.0)
NEUT#: 5 10*3/uL (ref 1.5–6.5)
NEUT%: 66.7 % (ref 39.0–75.0)
PLATELETS: 212 10*3/uL (ref 140–400)
RBC: 3.38 10*6/uL — AB (ref 4.20–5.82)
RDW: 14.3 % (ref 11.0–14.6)
WBC: 7.5 10*3/uL (ref 4.0–10.3)
lymph#: 1.4 10*3/uL (ref 0.9–3.3)

## 2013-08-25 LAB — BASIC METABOLIC PANEL (CC13)
Anion Gap: 10 mEq/L (ref 3–11)
BUN: 38.4 mg/dL — ABNORMAL HIGH (ref 7.0–26.0)
CO2: 25 meq/L (ref 22–29)
Calcium: 9.2 mg/dL (ref 8.4–10.4)
Chloride: 104 mEq/L (ref 98–109)
Creatinine: 1.9 mg/dL — ABNORMAL HIGH (ref 0.7–1.3)
GLUCOSE: 93 mg/dL (ref 70–140)
Potassium: 5 mEq/L (ref 3.5–5.1)
SODIUM: 139 meq/L (ref 136–145)

## 2013-08-25 LAB — HOLD TUBE, BLOOD BANK

## 2013-08-25 LAB — FERRITIN CHCC: Ferritin: 693 ng/ml — ABNORMAL HIGH (ref 22–316)

## 2013-08-25 NOTE — Progress Notes (Signed)
[facility] NOTE  Velna Hatchet, MD SUMMARY OF HEMATOLOGIC HISTORY: This is a pleasant 59 your gentleman with background history of chronic kidney disease and anemia chronic disease. In June of 2014, he had bone marrow aspirate and biopsy which ruled out malignancy. He also had history of melanoma resected in 2006 but have not seen a dermatologist for long time. On 03/31/2013, I was consulted in the hospital. The patient presented with trauma with acute drop in his hemoglobin. The patient received 2 units of blood transfusion. There is abnormal changes in his right thigh consistent with hematoma. His anticoagulation therapy was subsequently discontinued.  INTERVAL HISTORY: Ricardo Allen 80 y.o. male returns for further followup. His leg pain has improved. He denies any chest pain, shortness of breath or dizziness. He denies recent bleeding.  I have reviewed the past medical history, past surgical history, social history and family history with the patient and they are unchanged from previous note.  ALLERGIES:  is allergic to fentanyl; hydromorphone hcl; and sulfonamide derivatives.  MEDICATIONS:  Current Outpatient Prescriptions  Medication Sig Dispense Refill  . allopurinol (ZYLOPRIM) 100 MG tablet Take 1 tablet (100 mg total) by mouth daily.  30 tablet  5  . amiodarone (PACERONE) 200 MG tablet Take 1 tablet (200 mg total) by mouth daily.  30 tablet  5  . bisacodyl (DULCOLAX) 5 MG EC tablet Take 1 tablet (5 mg total) by mouth every morning.  30 tablet  0  . citalopram (CELEXA) 20 MG tablet Take 1 tablet (20 mg total) by mouth daily.  30 tablet  5  . Cobalamine Combinations (B-6 FOLIC ACID) 644-0347-42 MCG-MCG-MG CAPS Take 1 capsule by mouth daily.  30 each  11  . colchicine 0.6 MG tablet Take 0.6 mg by mouth daily as needed (for gout).      Marland Kitchen docusate sodium 100 MG CAPS Take 100 mg by mouth 2 (two) times daily.  60 capsule  5  . HYDROcodone-acetaminophen  (NORCO) 10-325 MG per tablet Take 1 tablet by mouth every 6 (six) hours as needed.  30 tablet  0  . metoprolol tartrate (LOPRESSOR) 25 MG tablet Take 1 tablet (25 mg total) by mouth 2 (two) times daily.  60 tablet  5  . morphine (MS CONTIN) 30 MG 12 hr tablet Take 1 tablet (30 mg total) by mouth every 12 (twelve) hours.  30 tablet  0  . pantoprazole (PROTONIX) 40 MG tablet Take 1 tablet (40 mg total) by mouth daily at 6 (six) AM.  30 tablet  5  . polyethylene glycol (MIRALAX / GLYCOLAX) packet Take 17 g by mouth daily as needed (constipation).      . Probiotic Product (PROBIOTIC DAILY PO) Take 1 capsule by mouth daily.        No current facility-administered medications for this visit.     REVIEW OF SYSTEMS:   Constitutional: Denies fevers, chills or night sweats Eyes: Denies blurriness of vision Ears, nose, mouth, throat, and face: Denies mucositis or sore throat Respiratory: Denies cough, dyspnea or wheezes Cardiovascular: Denies palpitation, chest discomfort or lower extremity swelling Gastrointestinal:  Denies nausea, heartburn or change in bowel habits Skin: Denies abnormal skin rashes Lymphatics: Denies new lymphadenopathy or easy bruising Neurological:Denies numbness, tingling or new weaknesses Behavioral/Psych: Mood is stable, no new changes  All other systems were reviewed with the patient and are negative.  PHYSICAL EXAMINATION: ECOG PERFORMANCE STATUS: 0 - Asymptomatic  Filed Vitals:   08/25/13 1141  BP: 137/65  Pulse: 72  Temp: 98.2 F (36.8 C)  Resp: 18   Filed Weights   08/25/13 1141  Weight: 179 lb 8 oz (81.421 kg)    GENERAL:alert, no distress and comfortable SKIN: skin color, texture, turgor are normal, no rashes or significant lesions. Noted multiple bruises. EYES: normal, Conjunctiva are pink and non-injected, sclera clear OROPHARYNX:no exudate, no erythema and lips, buccal mucosa, and tongue normal  Musculoskeletal:no cyanosis of digits and no clubbing   NEURO: alert & oriented x 3 with fluent speech, no focal motor/sensory deficits  LABORATORY DATA:  I have reviewed the data as listed Results for orders placed in visit on 08/25/13 (from the past 48 hour(s))  CBC WITH DIFFERENTIAL     Status: Abnormal   Collection Time    08/25/13 11:27 AM      Result Value Ref Range   WBC 7.5  4.0 - 10.3 10e3/uL   NEUT# 5.0  1.5 - 6.5 10e3/uL   HGB 10.4 (*) 13.0 - 17.1 g/dL   HCT 32.4 (*) 38.4 - 49.9 %   Platelets 212  140 - 400 10e3/uL   MCV 96.0  79.3 - 98.0 fL   MCH 30.8  27.2 - 33.4 pg   MCHC 32.1  32.0 - 36.0 g/dL   RBC 3.38 (*) 4.20 - 5.82 10e6/uL   RDW 14.3  11.0 - 14.6 %   lymph# 1.4  0.9 - 3.3 10e3/uL   MONO# 0.7  0.1 - 0.9 10e3/uL   Eosinophils Absolute 0.3  0.0 - 0.5 10e3/uL   Basophils Absolute 0.0  0.0 - 0.1 10e3/uL   NEUT% 66.7  39.0 - 75.0 %   LYMPH% 18.5  14.0 - 49.0 %   MONO% 9.8  0.0 - 14.0 %   EOS% 4.4  0.0 - 7.0 %   BASO% 0.6  0.0 - 2.0 %  HOLD TUBE, BLOOD BANK     Status: None   Collection Time    08/25/13 11:27 AM      Result Value Ref Range   Hold Tube, Blood Bank Blood Bank Order Cancelled      Lab Results  Component Value Date   WBC 7.5 08/25/2013   HGB 10.4* 08/25/2013   HCT 32.4* 08/25/2013   MCV 96.0 08/25/2013   PLT 212 08/25/2013    ASSESSMENT & PLAN:  Anemia This is likely anemia of chronic disease and chronic kidney disease. The patient denies recent history of bleeding such as epistaxis, hematuria or hematochezia. He is asymptomatic from the anemia. We will observe for now.  He does not require transfusion now.  The patient may need erythropoietin stimulating agents in the future. I recommend he follows with his primary care provider from now on or to be referred to see a nephrologist. I will be happy to see the patient back in the future if his hemoglobin dropped to less than 10 g to consider erythropoietin stimulating agents.  Atrial fibrillation He is no longer on anticoagulation therapy due to  recurrent falls and recent major hematoma.  Chronic kidney disease His kidney function is stable. His blood pressure is well controlled.    All questions were answered. The patient knows to call the clinic with any problems, questions or concerns. No barriers to learning was detected.  I spent 15 minutes counseling the patient face to face. The total time spent in the appointment was 20 minutes and more than 50% was on counseling.     Kymberley Raz, MD  08/25/2013 12:28 PM

## 2013-08-25 NOTE — Telephone Encounter (Signed)
Pt was asleep. Spoke with wife and relayed message below. Verbalized understanding

## 2013-08-25 NOTE — Assessment & Plan Note (Signed)
He is no longer on anticoagulation therapy due to recurrent falls and recent major hematoma.

## 2013-08-25 NOTE — Telephone Encounter (Signed)
Message copied by Patton Salles on Wed Aug 25, 2013  2:39 PM ------      Message from: [facility] [place], [place]: Wed Aug 25, 2013  1:42 PM      Regarding: elevated creatinine and BUN       Tell patient he could be dehydrated, increase oral fluid intake and follow-up with PCP in 2 weeks      ----- Message -----         From: [facility]         Sent: 08/25/2013  11:40 AM           To: Heath Lark, MD                   ------

## 2013-08-25 NOTE — Assessment & Plan Note (Signed)
His kidney function is stable. His blood pressure is well controlled.

## 2013-08-25 NOTE — Assessment & Plan Note (Signed)
This is likely anemia of chronic disease and chronic kidney disease. The patient denies recent history of bleeding such as epistaxis, hematuria or hematochezia. He is asymptomatic from the anemia. We will observe for now.  He does not require transfusion now.  The patient may need erythropoietin stimulating agents in the future. I recommend he follows with his primary care provider from now on or to be referred to see a nephrologist. I will be happy to see the patient back in the future if his hemoglobin dropped to less than 10 g to consider erythropoietin stimulating agents.

## 2013-08-30 ENCOUNTER — Ambulatory Visit: Payer: Medicare Other | Admitting: Gastroenterology

## 2013-08-30 ENCOUNTER — Encounter (INDEPENDENT_AMBULATORY_CARE_PROVIDER_SITE_OTHER): Payer: Medicare Other | Admitting: Ophthalmology

## 2013-08-30 DIAGNOSIS — H43819 Vitreous degeneration, unspecified eye: Secondary | ICD-10-CM

## 2013-08-30 DIAGNOSIS — H35369 Drusen (degenerative) of macula, unspecified eye: Secondary | ICD-10-CM

## 2013-08-31 ENCOUNTER — Encounter: Payer: Self-pay | Admitting: Neurology

## 2013-09-09 ENCOUNTER — Institutional Professional Consult (permissible substitution): Payer: Medicare Other | Admitting: Neurology

## 2013-09-27 ENCOUNTER — Encounter (INDEPENDENT_AMBULATORY_CARE_PROVIDER_SITE_OTHER): Payer: Medicare Other | Admitting: Ophthalmology

## 2013-09-27 DIAGNOSIS — H43819 Vitreous degeneration, unspecified eye: Secondary | ICD-10-CM

## 2013-09-27 DIAGNOSIS — H353290 Exudative age-related macular degeneration, unspecified eye, stage unspecified: Secondary | ICD-10-CM

## 2013-10-02 ENCOUNTER — Emergency Department (HOSPITAL_COMMUNITY)
Admission: EM | Admit: 2013-10-02 | Discharge: 2013-10-02 | Disposition: A | Discharge status: Home / Self Care | Payer: Medicare Other | Attending: Emergency Medicine | Admitting: Emergency Medicine

## 2013-10-02 ENCOUNTER — Emergency Department (HOSPITAL_COMMUNITY)
Admission: EM | Admit: 2013-10-02 | Discharge: 2013-10-02 | Disposition: A | Discharge status: Home / Self Care | Payer: Medicare Other | Source: Home / Self Care | Attending: Family Medicine | Admitting: Family Medicine

## 2013-10-02 ENCOUNTER — Emergency Department (HOSPITAL_COMMUNITY): Payer: Medicare Other

## 2013-10-02 ENCOUNTER — Encounter (HOSPITAL_COMMUNITY): Payer: Self-pay | Admitting: Emergency Medicine

## 2013-10-02 DIAGNOSIS — Z8719 Personal history of other diseases of the digestive system: Secondary | ICD-10-CM | POA: Insufficient documentation

## 2013-10-02 DIAGNOSIS — Z85820 Personal history of malignant melanoma of skin: Secondary | ICD-10-CM | POA: Diagnosis not present

## 2013-10-02 DIAGNOSIS — Z79899 Other long term (current) drug therapy: Secondary | ICD-10-CM | POA: Insufficient documentation

## 2013-10-02 DIAGNOSIS — S0990XA Unspecified injury of head, initial encounter: Secondary | ICD-10-CM

## 2013-10-02 DIAGNOSIS — W19XXXA Unspecified fall, initial encounter: Secondary | ICD-10-CM

## 2013-10-02 DIAGNOSIS — F3289 Other specified depressive episodes: Secondary | ICD-10-CM | POA: Diagnosis not present

## 2013-10-02 DIAGNOSIS — W010XXA Fall on same level from slipping, tripping and stumbling without subsequent striking against object, initial encounter: Secondary | ICD-10-CM | POA: Diagnosis not present

## 2013-10-02 DIAGNOSIS — S0101XA Laceration without foreign body of scalp, initial encounter: Secondary | ICD-10-CM

## 2013-10-02 DIAGNOSIS — M109 Gout, unspecified: Secondary | ICD-10-CM | POA: Insufficient documentation

## 2013-10-02 DIAGNOSIS — S6980XA Other specified injuries of unspecified wrist, hand and finger(s), initial encounter: Secondary | ICD-10-CM | POA: Diagnosis present

## 2013-10-02 DIAGNOSIS — S0181XA Laceration without foreign body of other part of head, initial encounter: Secondary | ICD-10-CM

## 2013-10-02 DIAGNOSIS — Y9289 Other specified places as the place of occurrence of the external cause: Secondary | ICD-10-CM | POA: Insufficient documentation

## 2013-10-02 DIAGNOSIS — S6990XA Unspecified injury of unspecified wrist, hand and finger(s), initial encounter: Secondary | ICD-10-CM | POA: Insufficient documentation

## 2013-10-02 DIAGNOSIS — IMO0002 Reserved for concepts with insufficient information to code with codable children: Secondary | ICD-10-CM | POA: Diagnosis not present

## 2013-10-02 DIAGNOSIS — F329 Major depressive disorder, single episode, unspecified: Secondary | ICD-10-CM | POA: Insufficient documentation

## 2013-10-02 DIAGNOSIS — S0100XA Unspecified open wound of scalp, initial encounter: Secondary | ICD-10-CM

## 2013-10-02 DIAGNOSIS — S0180XA Unspecified open wound of other part of head, initial encounter: Secondary | ICD-10-CM | POA: Diagnosis not present

## 2013-10-02 DIAGNOSIS — S62609B Fracture of unspecified phalanx of unspecified finger, initial encounter for open fracture: Secondary | ICD-10-CM

## 2013-10-02 DIAGNOSIS — Z862 Personal history of diseases of the blood and blood-forming organs and certain disorders involving the immune mechanism: Secondary | ICD-10-CM | POA: Diagnosis not present

## 2013-10-02 DIAGNOSIS — S63259A Unspecified dislocation of unspecified finger, initial encounter: Secondary | ICD-10-CM | POA: Diagnosis not present

## 2013-10-02 DIAGNOSIS — Z792 Long term (current) use of antibiotics: Secondary | ICD-10-CM | POA: Insufficient documentation

## 2013-10-02 DIAGNOSIS — I4891 Unspecified atrial fibrillation: Secondary | ICD-10-CM | POA: Insufficient documentation

## 2013-10-02 DIAGNOSIS — Y93K1 Activity, walking an animal: Secondary | ICD-10-CM | POA: Insufficient documentation

## 2013-10-02 DIAGNOSIS — S61209A Unspecified open wound of unspecified finger without damage to nail, initial encounter: Secondary | ICD-10-CM

## 2013-10-02 DIAGNOSIS — E785 Hyperlipidemia, unspecified: Secondary | ICD-10-CM | POA: Diagnosis not present

## 2013-10-02 DIAGNOSIS — Z87891 Personal history of nicotine dependence: Secondary | ICD-10-CM | POA: Diagnosis not present

## 2013-10-02 MED ORDER — BUPIVACAINE HCL (PF) 0.5 % IJ SOLN
10.0000 mL | Freq: Once | INTRAMUSCULAR | Status: AC
  Administered 2013-10-02: 10 mL
  Filled 2013-10-02: qty 10

## 2013-10-02 MED ORDER — CEPHALEXIN 500 MG PO CAPS: 500.0000 mg | ORAL_CAPSULE | Freq: Three times a day (TID) | ORAL | Status: DC

## 2013-10-02 MED ORDER — LIDOCAINE HCL (PF) 1 % IJ SOLN
2.0000 mL | Freq: Once | INTRAMUSCULAR | Status: DC
  Filled 2013-10-02: qty 5

## 2013-10-02 NOTE — Discharge Instructions (Signed)
Finger Dislocation Finger dislocation is the displacement of bones in your finger at the joints. Most commonly, finger dislocation occurs at the proximal interphalangeal joint (the joint closest to your knuckle). Very strong, fibrous tissues (ligaments) and joint capsules connect the three bones of your fingers.  CAUSES Dislocation is caused by a forceful impact. This impact moves these bones off the joint and often tears your ligaments.  SYMPTOMS Symptoms of finger dislocation include:  Deformity of your finger.  Pain, with loss of movement. DIAGNOSIS  Finger dislocation is diagnosed with a physical exam. Often, X-ray exams are done to see if you have associated injuries, such as bone fractures. TREATMENT  Finger dislocations are treated by putting your bones back into position (reduction) either by manually moving the bones back into place or through surgery. Your finger is then kept in a fixed position (immobilized) with the use of a dressing or splint for a brief period. When your ligament has to be surgically repaired, it needs to be kept in a fixed position with a dressing or splint for 1 to 2 weeks. Because joint stiffness is a long-term complication of finger dislocation, hand exercises or physical therapy to increase the range of motion and to regain strength is usually started as soon as the ligament is healed. Exercises and therapy generally last no more than 3 months. HOME CARE INSTRUCTIONS The following measures can help to reduce pain and speed up the healing process:  Rest your injured joint. Do not move until instructed otherwise by your caregiver. Avoid activities similar to the one that caused your injury.  Apply ice to your injured joint for the first day or 2 after your reduction or as directed by your caregiver. Applying ice helps to reduce inflammation and pain.  Put ice in a plastic bag.  Place a towel between your skin and the bag.  Leave the ice on for 15-20 minutes  at a time, every 2 hours while you are awake.  Elevate your hand above your heart as directed by your caregiver to reduce swelling.  Take over-the-counter or prescription medicine for pain as your caregiver instructs you. SEEK IMMEDIATE MEDICAL CARE IF:  Your dressing or splint becomes damaged.  Your pain becomes worse rather than better.  You lose feeling in your finger, or it becomes cold and white. MAKE SURE YOU:  Understand these instructions.  Will watch your condition.  Will get help right away if you are not doing well or get worse. Document Released: 12/29/1999 Document Revised: 03/25/2011 Document Reviewed: 10/21/2010 [facility] Patient Information 2015 [place], [place]. This information is not intended to replace advice given to you by your health care provider. Make sure you discuss any questions you have with your health care provider.  Laceration Care, Adult A laceration is a cut or lesion that goes through all layers of the skin and into the tissue just beneath the skin. TREATMENT  Some lacerations may not require closure. Some lacerations may not be able to be closed due to an increased risk of infection. It is important to see your caregiver as soon as possible after an injury to minimize the risk of infection and maximize the opportunity for successful closure. If closure is appropriate, pain medicines may be given, if needed. The wound will be cleaned to help prevent infection. Your caregiver will use stitches (sutures), staples, wound glue (adhesive), or skin adhesive strips to repair the laceration. These tools bring the skin edges together to allow for faster healing and  a better cosmetic outcome. However, all wounds will heal with a scar. Once the wound has healed, scarring can be minimized by covering the wound with sunscreen during the day for 1 full year. HOME CARE INSTRUCTIONS  For sutures or staples:  Keep the wound clean and dry.  If you were given a bandage  (dressing), you should change it at least once a day. Also, change the dressing if it becomes wet or dirty, or as directed by your caregiver.  Wash the wound with soap and water 2 times a day. Rinse the wound off with water to remove all soap. Pat the wound dry with a clean towel.  After cleaning, apply a thin layer of the antibiotic ointment as recommended by your caregiver. This will help prevent infection and keep the dressing from sticking.  You may shower as usual after the first 24 hours. Do not soak the wound in water until the sutures are removed.  Only take over-the-counter or prescription medicines for pain, discomfort, or fever as directed by your caregiver.  Get your sutures or staples removed as directed by your caregiver. For skin adhesive strips:  Keep the wound clean and dry.  Do not get the skin adhesive strips wet. You may bathe carefully, using caution to keep the wound dry.  If the wound gets wet, pat it dry with a clean towel.  Skin adhesive strips will fall off on their own. You may trim the strips as the wound heals. Do not remove skin adhesive strips that are still stuck to the wound. They will fall off in time. For wound adhesive:  You may briefly wet your wound in the shower or bath. Do not soak or scrub the wound. Do not swim. Avoid periods of heavy perspiration until the skin adhesive has fallen off on its own. After showering or bathing, gently pat the wound dry with a clean towel.  Do not apply liquid medicine, cream medicine, or ointment medicine to your wound while the skin adhesive is in place. This may loosen the film before your wound is healed.  If a dressing is placed over the wound, be careful not to apply tape directly over the skin adhesive. This may cause the adhesive to be pulled off before the wound is healed.  Avoid prolonged exposure to sunlight or tanning lamps while the skin adhesive is in place. Exposure to ultraviolet light in the first  year will darken the scar.  The skin adhesive will usually remain in place for 5 to 10 days, then naturally fall off the skin. Do not pick at the adhesive film. You may need a tetanus shot if:  You cannot remember when you had your last tetanus shot.  You have never had a tetanus shot. If you get a tetanus shot, your arm may swell, get red, and feel warm to the touch. This is common and not a problem. If you need a tetanus shot and you choose not to have one, there is a rare chance of getting tetanus. Sickness from tetanus can be serious. SEEK MEDICAL CARE IF:   You have redness, swelling, or increasing pain in the wound.  You see a red line that goes away from the wound.  You have yellowish-white fluid (pus) coming from the wound.  You have a fever.  You notice a bad smell coming from the wound or dressing.  Your wound breaks open before or after sutures have been removed.  You notice something coming out of  the wound such as wood or glass.  Your wound is on your hand or foot and you cannot move a finger or toe. SEEK IMMEDIATE MEDICAL CARE IF:   Your pain is not controlled with prescribed medicine.  You have severe swelling around the wound causing pain and numbness or a change in color in your arm, hand, leg, or foot.  Your wound splits open and starts bleeding.  You have worsening numbness, weakness, or loss of function of any joint around or beyond the wound.  You develop painful lumps near the wound or on the skin anywhere on your body. MAKE SURE YOU:   Understand these instructions.  Will watch your condition.  Will get help right away if you are not doing well or get worse. Document Released: 12/31/2004 Document Revised: 03/25/2011 Document Reviewed: 06/26/2010 [facility] Patient Information 2015 [place], [place]. This information is not intended to replace advice given to you by your health care provider. Make sure you discuss any questions you have with your health  care provider.

## 2013-10-02 NOTE — ED Provider Notes (Signed)
Reduction of dislocation Performed by: Tammy Sours MD Consent: Verbal consent obtained. Risks and benefits: risks, benefits and alternatives were discussed Consent given by: patient Required items: required blood products, implants, devices, and special equipment available Time out: Immediately prior to procedure a "time out" was called to verify the correct patient, procedure, equipment, support staff and site/side marked as required.  Patient sedated: No Anesthesia: Bupivicaine digital block  Vitals: Vital signs were monitored during sedation. Patient tolerance: Patient tolerated the procedure well with no immediate complications. Joint: Left index finger posterior dislocation of phalynx Reduction technique: Hyperextension and traction-counter traction. Alignment improved post procedure Confirmed reduction via XR  LACERATION REPAIR Performed by: Tammy Sours Consent: Verbal consent obtained. Risks and benefits: risks, benefits and alternatives were discussed Consent given by: patient Patient identity confirmed: provided demographic data Prepped and Draped in normal sterile fashion Wound explored  Laceration Location: Forehead  Laceration Length: 1 cm  No Foreign Bodies seen or palpated.  Debrided tissue  Anesthesia: local infiltration  Local anesthetic: none   Irrigation method: syringe Amount of cleaning: standard  Skin closure: close  Number of sutures: 1, vicryl rapide  Technique: simple  Patient tolerance: Patient tolerated the procedure well with no immediate complications.   LACERATION REPAIR Performed by: Tammy Sours Authorized by: Tammy Sours Consent: Verbal consent obtained. Risks and benefits: risks, benefits and alternatives were discussed Consent given by: patient Patient identity confirmed: provided demographic data Prepped and Draped in normal sterile fashion Wound explored  Laceration Location: volar aspect of left index finger at  PIP  Laceration Length: 2 cm  No Foreign Bodies seen or palpated  Anesthesia: local infiltration  Local anesthetic: Bupivicaine digital block  Irrigation method: syringe Amount of cleaning: standard  Skin closure: loose  Number of sutures: 3  Technique: simple interrupted  Patient tolerance: Patient tolerated the procedure well with no immediate complications.   Tammy Sours, MD 10/03/13 [phone]

## 2013-10-02 NOTE — Progress Notes (Signed)
Orthopedic Tech Progress Note Patient Details:  Ricardo Allen 1925/08/14 [id number]  Ortho Devices Type of Ortho Device: Finger splint Ortho Device/Splint Interventions: Application   Asia R Thompson 10/02/2013, 7:00 PM

## 2013-10-02 NOTE — ED Notes (Signed)
States he tripped earlier this AM while walking dog, hit face on sidewalk, injured left index finger. Denies LOC. Minimal bleeding from forehead laceration(~2 cm) no bleeding from laceration to left index finger, tendon visible (reportedly was bent backwards).

## 2013-10-02 NOTE — ED Provider Notes (Signed)
Ricardo Allen is a 80 y.o. male who presents to Urgent Care today for followup. Patient tripped and fell this morning landing on his left hand and forehead. He suffered a scalp laceration to the forehead as well as an open fracture dislocation to the left index finger. He reduced the finger injury himself. He denies any loss of consciousness weakness or numbness. He denies any numbness or weakness to the finger but notes difficulty with motion. He has a pertinent medical history for old intracranial bleed. His behavior has not changed according to his daughter   Past Medical History  Diagnosis Date  . Atrial fibrillation 11/27/2009  . DEPRESSION 08/27/2006  . DISORDER, LUMBAR DISC W/MYELOPATHY 08/19/2006  . GOUT 03/22/2009  . HYPERLIPIDEMIA 08/27/2006  . HYPOTENSION, ORTHOSTATIC 10/31/2009  . Impaired fasting glucose 01/27/2007  . LOW BACK PAIN 02/03/2007    Lumbar radiculopathy 09/2006  . LUNG NODULE 03/29/2008  . MELANOMA, MALIGNANT, TRUNK 08/19/2006    early stage; just exicion.   . OBSTRUCTIVE SLEEP APNEA 08/19/2006  . TRAUMATIC ARTHROPATHY PELVIC REGION AND THIGH 06/29/2009  . TOBACCO ABUSE 12/31/2007  . Wet senile macular degeneration   . Diverticulosis   . Anemia of other chronic disease 2008    EGD and colonoscopy in 2014 with diverticulosis only  . 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 Smoking status: Former Smoker -- 2.00 packs/day for 60 years    Types: Cigarettes    Quit date: 04/14/2008  . Smokeless tobacco: Never Used  . Alcohol Use: 4.4 oz/week    4 Glasses of wine, 4 Drinks containing 0.5 oz of alcohol per week   ROS as above Medications: No current facility-administered medications for this encounter.   Current Outpatient Prescriptions  Medication Sig Dispense Refill  . allopurinol (ZYLOPRIM) 100 MG tablet Take 1 tablet (100 mg total) by mouth daily.  30 tablet  5  . amiodarone (PACERONE) 200 MG tablet Take 1 tablet (200 mg total)  by mouth daily.  30 tablet  5  . bisacodyl (DULCOLAX) 5 MG EC tablet Take 1 tablet (5 mg total) by mouth every morning.  30 tablet  0  . citalopram (CELEXA) 20 MG tablet Take 1 tablet (20 mg total) by mouth daily.  30 tablet  5  . Cobalamine Combinations (B-6 FOLIC ACID) 678-9381-01 MCG-MCG-MG CAPS Take 1 capsule by mouth daily.  30 each  11  . colchicine 0.6 MG tablet Take 0.6 mg by mouth daily as needed (for gout).      Marland Kitchen docusate sodium 100 MG CAPS Take 100 mg by mouth 2 (two) times daily.  60 capsule  5  . HYDROcodone-acetaminophen (NORCO) 10-325 MG per tablet Take 1 tablet by mouth every 6 (six) hours as needed.  30 tablet  0  . metoprolol tartrate (LOPRESSOR) 25 MG tablet Take 1 tablet (25 mg total) by mouth 2 (two) times daily.  60 tablet  5  . morphine (MS CONTIN) 30 MG 12 hr tablet Take 1 tablet (30 mg total) by mouth every 12 (twelve) hours.  30 tablet  0  . pantoprazole (PROTONIX) 40 MG tablet Take 1 tablet (40 mg total) by mouth daily at 6 (six) AM.  30 tablet  5  . polyethylene glycol (MIRALAX / GLYCOLAX) packet Take 17 g by mouth daily as needed (constipation).      . Probiotic Product (PROBIOTIC DAILY PO) Take 1 capsule by mouth daily.  Exam:  BP 173/78  Pulse 70  Temp(Src) 98.7 F (37.1 C) (Oral)  SpO2 100% Gen: Well NAD HEENT: EOMI,  MMM 2 cm laceration to the left forehead.  Lungs: Normal work of breathing. CTABL Heart: RRR no MRG Abd: NABS, Soft. Nondistended, Nontender Exts: Brisk capillary refill, warm and well perfused.  Left index finger: Rotational deformity present beyond the PIP. There is a laceration to the palmar aspect of the PIP. The tendon is visible. Patient is unable to flex the digit at the PIP. Capillary Refill and sensation is intact distally. Neuro: Alert and oriented normal speech and thought process.  No results found for this or any previous visit (from the past 24 hour(s)). No results found.  Assessment and Plan: 80 y.o. male with fall  with head injury for head laceration and probable open fracture of the left index finger. Most concerning injury is the head injury. Patient is neurologically normal at this time however he has a history of old intracranial bleed. He is not currently on any blood thinners. However I believe that he would benefit from further evaluation and management in the emergency department no further rule out new subdural hematoma.  Additionally patient has what appears to be an open fracture dislocation of the left index finger. As we transferred the patient elected not to proceed with further workup at this issue at this time. I will be addressed at the emergency room. We've applied a wet sterile saline dressing.   Transfer via private vehicle to emergency department. His daughter will drive.  Discussed warning signs or symptoms. Please see discharge instructions. Patient expresses understanding.     Gregor Hams, MD 10/02/13 [phone]

## 2013-10-02 NOTE — ED Notes (Signed)
Spoke to xray tech about length of time before finger xray could be completed; reports 2 orders ahead of this patient

## 2013-10-02 NOTE — ED Notes (Signed)
Pt reports walking his dog and he tripped and fell. Denies loc. Hit forehead on cement, has laceration to head, bleeding controlled. Abrasion noted to face and nose, bilateral knees. Pt went to ucc first, bandaged applied to left index finger laceration.

## 2013-10-02 NOTE — ED Notes (Signed)
Bandage applied to L index finger and to laceration to L eyebrow; ortho tech at bedside applying finger splint

## 2013-10-02 NOTE — ED Notes (Signed)
Ortho paged to place Left static splint

## 2013-10-02 NOTE — Discharge Instructions (Signed)
GI DIRECTLY TO THE EMERGENCY ROOM

## 2013-10-05 NOTE — ED Provider Notes (Signed)
I saw and evaluated the patient, reviewed the resident's note and I agree with the findings and plan.   EKG Interpretation None     I was present for the entire procedure of finger dislocation and suturing.  Jasper Riling. Alvino Chapel, MD 10/05/13 [phone]

## 2013-10-08 NOTE — ED Provider Notes (Signed)
CSN: [id number]     Arrival date & time 10/02/13  1157 History   First MD Initiated Contact with Patient 10/02/13 1326     Chief Complaint  Patient presents with  . Fall     (Consider location/radiation/quality/duration/timing/severity/associated sxs/prior Treatment) Patient is a 80 y.o. male presenting with fall. The history is provided by the patient.  Fall This is a new problem. Pertinent negatives include no headaches and no shortness of breath.   patient with a fall. He tripped and fell down. Striking his head and his fingers. Seen at urgent care and sent to the ER for further evaluation for head wound and open finger fracture. No loss consciousness. No neck pain. No confusion. He does have deformity of his left index  Past Medical History  Diagnosis Date  . Atrial fibrillation 11/27/2009  . DEPRESSION 08/27/2006  . DISORDER, LUMBAR DISC W/MYELOPATHY 08/19/2006  . GOUT 03/22/2009  . HYPERLIPIDEMIA 08/27/2006  . HYPOTENSION, ORTHOSTATIC 10/31/2009  . Impaired fasting glucose 01/27/2007  . LOW BACK PAIN 02/03/2007    Lumbar radiculopathy 09/2006  . LUNG NODULE 03/29/2008  . MELANOMA, MALIGNANT, TRUNK 08/19/2006    early stage; just exicion.   . OBSTRUCTIVE SLEEP APNEA 08/19/2006  . TRAUMATIC ARTHROPATHY PELVIC REGION AND THIGH 06/29/2009  . TOBACCO ABUSE 12/31/2007  . Wet senile macular degeneration   . Diverticulosis   . Anemia of other chronic disease 2008    EGD and colonoscopy in 2014 with diverticulosis only  . 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 Cataract extraction Bilateral   . Lumbar laminectomy    . Lumbar fusion    . Tonsilectomy, adenoidectomy, bilateral myringotomy and tubes    . Melanoma excision      left lower back  . Esophagogastroduodenoscopy N/A 06/20/2012    Procedure: ESOPHAGOGASTRODUODENOSCOPY (EGD);  Surgeon: Inda Castle, MD;  Location: [facility];  Service: Endoscopy;  Laterality: N/A;  .  Colonoscopy N/A 06/21/2012    Procedure: COLONOSCOPY;  Surgeon: Inda Castle, MD;  Location: [facility];  Service: Endoscopy;  Laterality: N/A;   Family History  Problem Relation Age of Onset  . Pancreatic cancer Mother   . Pancreatic cancer Mother   . Testicular cancer Father   . Arthritis Brother   . Cancer Maternal Aunt     head/neck cancer   History  Substance Use Topics  . Smoking status: Former Smoker -- 2.00 packs/day for 60 years    Types: Cigarettes    Quit date: 04/14/2008  . Smokeless tobacco: Never Used  . Alcohol Use: 4.4 oz/week    4 Glasses of wine, 4 Drinks containing 0.5 oz of alcohol per week    Review of Systems  Respiratory: Negative for shortness of breath.   Musculoskeletal: Negative for back pain and neck pain.  Skin: Positive for wound.  Neurological: Negative for headaches.  Psychiatric/Behavioral: Negative for confusion.      Allergies  Fentanyl; Hydromorphone hcl; and Sulfonamide derivatives  Home Medications   Prior to Admission medications   Medication Sig Start Date End Date Taking? Authorizing Provider  alendronate (FOSAMAX) 70 MG tablet Take 70 mg by mouth every Sunday. Take with a full glass of water on an empty stomach.   Yes Historical Provider, MD  allopurinol (ZYLOPRIM) 100 MG tablet Take 100 mg by mouth daily as needed (for gout).   Yes Historical Provider, MD  amiodarone (PACERONE) 200 MG tablet  Take 200 mg by mouth daily.   Yes Historical Provider, MD  citalopram (CELEXA) 20 MG tablet Take 20 mg by mouth daily.   Yes Historical Provider, MD  colchicine 0.6 MG tablet Take 0.6 mg by mouth daily as needed (for gout).   Yes Historical Provider, MD  docusate sodium (COLACE) 100 MG capsule Take 100 mg by mouth daily as needed for mild constipation.   Yes Historical Provider, MD  HYDROcodone-acetaminophen (NORCO) 10-325 MG per tablet Take 1 tablet by mouth every 6 (six) hours as needed for moderate pain.   Yes Historical Provider, MD   metoprolol tartrate (LOPRESSOR) 25 MG tablet Take 25 mg by mouth 2 (two) times daily.   Yes Historical Provider, MD  morphine (MS CONTIN) 30 MG 12 hr tablet Take 30 mg by mouth every 12 (twelve) hours.   Yes Historical Provider, MD  pantoprazole (PROTONIX) 40 MG tablet Take 40 mg by mouth daily.   Yes Historical Provider, MD  polyethylene glycol (MIRALAX / GLYCOLAX) packet Take 17 g by mouth daily as needed (constipation).   Yes Historical Provider, MD  Probiotic Product (PROBIOTIC DAILY PO) Take 1 capsule by mouth daily.    Yes Historical Provider, MD  Testosterone (ANDROGEL) 20.25 MG/1.25GM (1.62%) GEL Place 1 application onto the skin daily.   Yes Historical Provider, MD  cephALEXin (KEFLEX) 500 MG capsule Take 1 capsule (500 mg total) by mouth 3 (three) times daily. 10/02/13   Jasper Riling. Vershawn Westrup, MD   BP 177/74  Pulse 83  Temp(Src) 98.2 F (36.8 C) (Oral)  Resp 16  SpO2 100% Physical Exam  Constitutional: He appears well-developed and well-nourished.  HENT:  2 cm laceration to left forehead. No underlying bony tenderness.  Neck: Normal range of motion. Neck supple.  Cardiovascular: Normal rate.   Pulmonary/Chest: Effort normal and breath sounds normal.  Abdominal: Soft.  Musculoskeletal: He exhibits tenderness.  Left index finger shows laceration over PIP joint with some extension of the finger. Neurovascular intact distally, although decreased movement at the PIP joint. Visible tendon and laceration.  Neurological: He is alert.  Skin: Skin is warm.    ED Course  Procedures (including critical care time) Labs Review Labs Reviewed - No data to display  Imaging Review No results found.   EKG Interpretation None      MDM   Final diagnoses:  Open finger dislocation, initial encounter  Forehead laceration, initial encounter  Fall, initial encounter    Patient with fall. He laceration which was repaired. Negative imaging. Has open PIP this location with small possible  fracture. Discussed with hand surgery. Will see in followup. Reduced in ER by resident under my direct supervision. Discussed antibiotics with hand and will see in followup    Jasper Riling. Alvino Chapel, MD 10/08/13 [phone]

## 2013-10-25 ENCOUNTER — Encounter (INDEPENDENT_AMBULATORY_CARE_PROVIDER_SITE_OTHER): Payer: Medicare Other | Admitting: Ophthalmology

## 2013-10-25 DIAGNOSIS — H3532 Exudative age-related macular degeneration: Secondary | ICD-10-CM

## 2013-10-25 DIAGNOSIS — H43813 Vitreous degeneration, bilateral: Secondary | ICD-10-CM

## 2013-11-18 ENCOUNTER — Ambulatory Visit: Payer: Medicare Other | Admitting: Hematology and Oncology

## 2013-11-18 ENCOUNTER — Telehealth: Payer: Self-pay | Admitting: *Deleted

## 2013-11-18 ENCOUNTER — Other Ambulatory Visit: Payer: Medicare Other

## 2013-11-18 NOTE — Telephone Encounter (Signed)
S/w wife in [place] today.  Pt and wife showed up for appts today that were canceled.  Informed wife that pt was told on his last visit w/ Dr. Alvy Bimler that he did not need to come back and could f/u w/ his PCP and Kidney MD.  Wife said she thought Dr. Alvy Bimler was pt's kidney MD.  Informed her she was seeing pt for anemia but pt's PCP can monitor the anemia along w/ Kidney MD.  Wife c/o pt having low blood pressure and also having some memory loss and hallucinations.  She was told by one of his MDs that pt has Lewy Body Syndrome.  Instructed wife to have pt see his PCP.  If PCP feels pt needs to come back to [place], let us know. She verbalized understanding.

## 2013-11-22 ENCOUNTER — Encounter (INDEPENDENT_AMBULATORY_CARE_PROVIDER_SITE_OTHER): Payer: Medicare Other | Admitting: Ophthalmology

## 2013-11-22 DIAGNOSIS — H43813 Vitreous degeneration, bilateral: Secondary | ICD-10-CM

## 2013-11-22 DIAGNOSIS — H3532 Exudative age-related macular degeneration: Secondary | ICD-10-CM

## 2013-11-22 DIAGNOSIS — H3531 Nonexudative age-related macular degeneration: Secondary | ICD-10-CM

## 2013-12-13 ENCOUNTER — Ambulatory Visit: Payer: Medicare Other | Admitting: Neurology

## 2013-12-13 ENCOUNTER — Telehealth: Payer: Self-pay | Admitting: Neurology

## 2013-12-13 NOTE — Telephone Encounter (Signed)
Pt wife called the called call a nurse and canceled appt and does not want to resch appt notified the referring Dr Einar Gip

## 2013-12-20 ENCOUNTER — Encounter (INDEPENDENT_AMBULATORY_CARE_PROVIDER_SITE_OTHER): Payer: Medicare Other | Admitting: Ophthalmology

## 2013-12-20 ENCOUNTER — Emergency Department (HOSPITAL_COMMUNITY)
Admission: EM | Admit: 2013-12-20 | Discharge: 2013-12-21 | Disposition: A | Discharge status: Home / Self Care | Payer: Medicare Other | Attending: Emergency Medicine | Admitting: Emergency Medicine

## 2013-12-20 ENCOUNTER — Emergency Department (HOSPITAL_COMMUNITY): Payer: Medicare Other

## 2013-12-20 DIAGNOSIS — Z79899 Other long term (current) drug therapy: Secondary | ICD-10-CM | POA: Insufficient documentation

## 2013-12-20 DIAGNOSIS — R509 Fever, unspecified: Secondary | ICD-10-CM

## 2013-12-20 DIAGNOSIS — H3532 Exudative age-related macular degeneration: Secondary | ICD-10-CM

## 2013-12-20 DIAGNOSIS — Z85820 Personal history of malignant melanoma of skin: Secondary | ICD-10-CM | POA: Diagnosis not present

## 2013-12-20 DIAGNOSIS — Z8639 Personal history of other endocrine, nutritional and metabolic disease: Secondary | ICD-10-CM | POA: Insufficient documentation

## 2013-12-20 DIAGNOSIS — R05 Cough: Secondary | ICD-10-CM | POA: Diagnosis present

## 2013-12-20 DIAGNOSIS — J209 Acute bronchitis, unspecified: Secondary | ICD-10-CM | POA: Insufficient documentation

## 2013-12-20 DIAGNOSIS — D72829 Elevated white blood cell count, unspecified: Secondary | ICD-10-CM | POA: Diagnosis not present

## 2013-12-20 DIAGNOSIS — Z8669 Personal history of other diseases of the nervous system and sense organs: Secondary | ICD-10-CM | POA: Insufficient documentation

## 2013-12-20 DIAGNOSIS — Z87891 Personal history of nicotine dependence: Secondary | ICD-10-CM | POA: Insufficient documentation

## 2013-12-20 DIAGNOSIS — Z792 Long term (current) use of antibiotics: Secondary | ICD-10-CM | POA: Insufficient documentation

## 2013-12-20 DIAGNOSIS — H3531 Nonexudative age-related macular degeneration: Secondary | ICD-10-CM

## 2013-12-20 DIAGNOSIS — M109 Gout, unspecified: Secondary | ICD-10-CM | POA: Diagnosis not present

## 2013-12-20 DIAGNOSIS — H43813 Vitreous degeneration, bilateral: Secondary | ICD-10-CM

## 2013-12-20 DIAGNOSIS — F329 Major depressive disorder, single episode, unspecified: Secondary | ICD-10-CM | POA: Insufficient documentation

## 2013-12-20 DIAGNOSIS — I4891 Unspecified atrial fibrillation: Secondary | ICD-10-CM | POA: Insufficient documentation

## 2013-12-20 DIAGNOSIS — R059 Cough, unspecified: Secondary | ICD-10-CM

## 2013-12-20 DIAGNOSIS — J40 Bronchitis, not specified as acute or chronic: Secondary | ICD-10-CM

## 2013-12-20 LAB — CBC
HCT: 32.4 % — ABNORMAL LOW (ref 39.0–52.0)
HEMOGLOBIN: 10.2 g/dL — AB (ref 13.0–17.0)
MCH: 29.7 pg (ref 26.0–34.0)
MCHC: 31.5 g/dL (ref 30.0–36.0)
MCV: 94.5 fL (ref 78.0–100.0)
Platelets: 216 10*3/uL (ref 150–400)
RBC: 3.43 MIL/uL — ABNORMAL LOW (ref 4.22–5.81)
RDW: 14.6 % (ref 11.5–15.5)
WBC: 15 10*3/uL — ABNORMAL HIGH (ref 4.0–10.5)

## 2013-12-20 LAB — BASIC METABOLIC PANEL
Anion gap: 13 (ref 5–15)
BUN: 28 mg/dL — ABNORMAL HIGH (ref 6–23)
CALCIUM: 9.1 mg/dL (ref 8.4–10.5)
CO2: 24 mEq/L (ref 19–32)
CREATININE: 1.74 mg/dL — AB (ref 0.50–1.35)
Chloride: 101 mEq/L (ref 96–112)
GFR calc Af Amer: 39 mL/min — ABNORMAL LOW (ref >90)
GFR, EST NON AFRICAN AMERICAN: 34 mL/min — AB (ref >90)
GLUCOSE: 114 mg/dL — AB (ref 70–99)
Potassium: 4.7 mEq/L (ref 3.7–5.3)
Sodium: 138 mEq/L (ref 137–147)

## 2013-12-20 LAB — I-STAT TROPONIN, ED: Troponin i, poc: 0 ng/mL (ref 0.00–0.08)

## 2013-12-20 LAB — I-STAT CG4 LACTIC ACID, ED: Lactic Acid, Venous: 0.83 mmol/L (ref 0.5–2.2)

## 2013-12-20 MED ORDER — ACETAMINOPHEN 500 MG PO TABS
1000.0000 mg | ORAL_TABLET | Freq: Once | ORAL | Status: AC
  Administered 2013-12-20: 1000 mg via ORAL
  Filled 2013-12-20: qty 2

## 2013-12-20 MED ORDER — SODIUM CHLORIDE 0.9 % IV BOLUS (SEPSIS)
1000.0000 mL | Freq: Once | INTRAVENOUS | Status: AC
  Administered 2013-12-20: 1000 mL via INTRAVENOUS

## 2013-12-20 NOTE — ED Notes (Signed)
Pt returned from scan. Monitored by pulse ox, bp cuff, and 12-lead.

## 2013-12-20 NOTE — ED Notes (Signed)
Clarified, lactic acid is 0.83, blood cultures ordered with Dr. Mingo Amber. Cultures still needed.

## 2013-12-20 NOTE — ED Notes (Signed)
Per ems, the patient is from [facility] and has complaint of cough since this morning.  Clear mucous. No complaints of shortness of breath, no n/v and no pain.  99.7 temp at home prior to arrival. Patient took mucinex at [place]. cbg 147, bp 160/84, p 80, rr 18, o2 sat 96% on room air.

## 2013-12-20 NOTE — ED Notes (Signed)
Discussed plan of care with Dr. Mingo Amber, patient's rectal temp 100.8, labs collected. He acknowledges, no new orders at this time, patient currently in x-ray.

## 2013-12-20 NOTE — ED Notes (Signed)
PT monitored by pulse ox, bp cuff, and 5-lead. 

## 2013-12-21 LAB — URINALYSIS, ROUTINE W REFLEX MICROSCOPIC
Bilirubin Urine: NEGATIVE
GLUCOSE, UA: NEGATIVE mg/dL
HGB URINE DIPSTICK: NEGATIVE
KETONES UR: NEGATIVE mg/dL
Leukocytes, UA: NEGATIVE
Nitrite: NEGATIVE
PH: 7 (ref 5.0–8.0)
Protein, ur: NEGATIVE mg/dL
Specific Gravity, Urine: 1.024 (ref 1.005–1.030)
Urobilinogen, UA: 0.2 mg/dL (ref 0.0–1.0)

## 2013-12-21 MED ORDER — LEVOFLOXACIN 750 MG PO TABS: 750.0000 mg | ORAL_TABLET | Freq: Every day | ORAL | Status: DC

## 2013-12-21 MED ORDER — LEVOFLOXACIN 750 MG PO TABS
750.0000 mg | ORAL_TABLET | Freq: Once | ORAL | Status: AC
  Administered 2013-12-21: 750 mg via ORAL
  Filled 2013-12-21: qty 1

## 2013-12-21 NOTE — ED Notes (Signed)
[facility] was called because wife reported patient has periods of confusion, patient's age, adn time of discharge being late.  [facility] present, explained patient status. Prepared for transport.

## 2013-12-21 NOTE — ED Notes (Signed)
Called [facility] again because patient reports relative is not able to pick him up.

## 2013-12-21 NOTE — ED Provider Notes (Signed)
CSN: [id number]     Arrival date & time 12/20/13  2200 History   First MD Initiated Contact with Patient 12/20/13 2214     Chief Complaint  Patient presents with  . Cough     (Consider location/radiation/quality/duration/timing/severity/associated sxs/prior Treatment) Patient is a 80 y.o. male presenting with cough. The history is provided by the patient.  Cough Cough characteristics:  Non-productive Severity:  Moderate Onset quality:  Gradual Duration:  1 day Timing:  Constant Progression:  Unchanged Chronicity:  New Smoker: no   Context: not sick contacts   Relieved by:  Nothing Worsened by:  Nothing tried Associated symptoms: no chills, no fever, no rash, no rhinorrhea and no shortness of breath     Past Medical History  Diagnosis Date  . Atrial fibrillation 11/27/2009  . DEPRESSION 08/27/2006  . DISORDER, LUMBAR DISC W/MYELOPATHY 08/19/2006  . GOUT 03/22/2009  . HYPERLIPIDEMIA 08/27/2006  . HYPOTENSION, ORTHOSTATIC 10/31/2009  . Impaired fasting glucose 01/27/2007  . LOW BACK PAIN 02/03/2007    Lumbar radiculopathy 09/2006  . LUNG NODULE 03/29/2008  . MELANOMA, MALIGNANT, TRUNK 08/19/2006    early stage; just exicion.   . OBSTRUCTIVE SLEEP APNEA 08/19/2006  . TRAUMATIC ARTHROPATHY PELVIC REGION AND THIGH 06/29/2009  . TOBACCO ABUSE 12/31/2007  . Wet senile macular degeneration   . Diverticulosis   . Anemia of other chronic disease 2008    EGD and colonoscopy in 2014 with diverticulosis only  . 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 Cataract extraction Bilateral   . Lumbar laminectomy    . Lumbar fusion    . Tonsilectomy, adenoidectomy, bilateral myringotomy and tubes    . Melanoma excision      left lower back  . Esophagogastroduodenoscopy N/A 06/20/2012    Procedure: ESOPHAGOGASTRODUODENOSCOPY (EGD);  Surgeon: Inda Castle, MD;  Location: [facility];  Service: Endoscopy;  Laterality: N/A;  . Colonoscopy  N/A 06/21/2012    Procedure: COLONOSCOPY;  Surgeon: Inda Castle, MD;  Location: [facility];  Service: Endoscopy;  Laterality: N/A;   Family History  Problem Relation Age of Onset  . Pancreatic cancer Mother   . Pancreatic cancer Mother   . Testicular cancer Father   . Arthritis Brother   . Cancer Maternal Aunt     head/neck cancer   History  Substance Use Topics  . Smoking status: Former Smoker -- 2.00 packs/day for 60 years    Types: Cigarettes    Quit date: 04/14/2008  . Smokeless tobacco: Never Used  . Alcohol Use: 4.4 oz/week    4 Glasses of wine, 4 Not specified per week    Review of Systems  Constitutional: Negative for fever and chills.  HENT: Negative for rhinorrhea.   Respiratory: Negative for cough and shortness of breath.   Gastrointestinal: Negative for vomiting and abdominal pain.  Skin: Negative for rash.  All other systems reviewed and are negative.     Allergies  Fentanyl; Hydromorphone hcl; and Sulfonamide derivatives  Home Medications   Prior to Admission medications   Medication Sig Start Date End Date Taking? Authorizing Provider  alendronate (FOSAMAX) 70 MG tablet Take 70 mg by mouth every Sunday. Take with a full glass of water on an empty stomach.    Historical Provider, MD  allopurinol (ZYLOPRIM) 100 MG tablet Take 100 mg by mouth daily as needed (for gout).    Historical Provider, MD  amiodarone (PACERONE) 200 MG  tablet Take 200 mg by mouth daily.    Historical Provider, MD  cephALEXin (KEFLEX) 500 MG capsule Take 1 capsule (500 mg total) by mouth 3 (three) times daily. 10/02/13   Jasper Riling. Pickering, MD  citalopram (CELEXA) 20 MG tablet Take 20 mg by mouth daily.    Historical Provider, MD  colchicine 0.6 MG tablet Take 0.6 mg by mouth daily as needed (for gout).    Historical Provider, MD  docusate sodium (COLACE) 100 MG capsule Take 100 mg by mouth daily as needed for mild constipation.    Historical Provider, MD  HYDROcodone-acetaminophen  (NORCO) 10-325 MG per tablet Take 1 tablet by mouth every 6 (six) hours as needed for moderate pain.    Historical Provider, MD  levofloxacin (LEVAQUIN) 750 MG tablet Take 1 tablet (750 mg total) by mouth daily. X 7 days 12/21/13   Evelina Bucy, MD  metoprolol tartrate (LOPRESSOR) 25 MG tablet Take 25 mg by mouth 2 (two) times daily.    Historical Provider, MD  morphine (MS CONTIN) 30 MG 12 hr tablet Take 30 mg by mouth every 12 (twelve) hours.    Historical Provider, MD  pantoprazole (PROTONIX) 40 MG tablet Take 40 mg by mouth daily.    Historical Provider, MD  polyethylene glycol (MIRALAX / GLYCOLAX) packet Take 17 g by mouth daily as needed (constipation).    Historical Provider, MD  Probiotic Product (PROBIOTIC DAILY PO) Take 1 capsule by mouth daily.     Historical Provider, MD  Testosterone (ANDROGEL) 20.25 MG/1.25GM (1.62%) GEL Place 1 application onto the skin daily.    Historical Provider, MD   BP 148/66 mmHg  Pulse 93  Temp(Src) 98.7 F (37.1 C) (Oral)  Resp 21  Ht 5\' 10"  (1.778 m)  Wt 170 lb (77.111 kg)  BMI 24.39 kg/m2  SpO2 100% Physical Exam  Constitutional: He is oriented to person, place, and time. He appears well-developed and well-nourished. No distress.  HENT:  Head: Normocephalic and atraumatic.  Mouth/Throat: Oropharynx is clear and moist. No oropharyngeal exudate.  Eyes: EOM are normal. Pupils are equal, round, and reactive to light.  Neck: Normal range of motion. Neck supple.  Cardiovascular: Normal rate and regular rhythm.  Exam reveals no friction rub.   No murmur heard. Pulmonary/Chest: Effort normal and breath sounds normal. No respiratory distress. He has no wheezes. He has no rales.  Abdominal: Soft. He exhibits no distension. There is no tenderness. There is no rebound.  Musculoskeletal: Normal range of motion. He exhibits no edema.  Neurological: He is alert and oriented to person, place, and time. No cranial nerve deficit. He exhibits normal muscle tone.  Coordination normal.  Skin: No rash noted. He is not diaphoretic.  Nursing note and vitals reviewed.   ED Course  Procedures (including critical care time) Labs Review Labs Reviewed  CBC - Abnormal; Notable for the following:    WBC 15.0 (*)    RBC 3.43 (*)    Hemoglobin 10.2 (*)    HCT 32.4 (*)    All other components within normal limits  BASIC METABOLIC PANEL - Abnormal; Notable for the following:    Glucose, Bld 114 (*)    BUN 28 (*)    Creatinine, Ser 1.74 (*)    GFR calc non Af Amer 34 (*)    GFR calc Af Amer 39 (*)    All other components within normal limits  CULTURE, BLOOD (ROUTINE X 2)  CULTURE, BLOOD (ROUTINE X 2)  URINALYSIS, ROUTINE  W REFLEX MICROSCOPIC  I-STAT CG4 LACTIC ACID, ED  Randolm Idol, ED    Imaging Review Dg Chest 2 View  12/20/2013   CLINICAL DATA:  Cough and fever  EXAM: CHEST  2 VIEW  COMPARISON:  03/28/2013  FINDINGS: Mild enlargement of cardiac silhouette.  Calcified and tortuous thoracic aorta.  Mediastinal contours normal.  Emphysematous and bronchitic changes consistent with COPD.  Mild pectus deformity.  Nodular focus at LEFT lung base corresponds to calcified pleural plaques on prior CT.  Minimal RIGHT basilar atelectasis.  No definite acute infiltrate, pleural effusion or pneumothorax.  Bones demineralized with scattered endplate spur formation thoracic spine.  IMPRESSION: Mild enlargement of cardiac silhouette.  COPD changes with RIGHT basilar atelectasis and calcified pleural plaques at LEFT base.  No definite acute abnormalities.   Electronically Signed   By: Lavonia Dana M.D.   On: 12/20/2013 23:18     EKG Interpretation None      Date: 12/21/2013  Rate: 90  Rhythm: normal sinus rhythm  QRS Axis: normal  Intervals: normal  ST/T Wave abnormalities: normal  Conduction Disutrbances: RBBB, LAFB  Narrative Interpretation:   Old EKG Reviewed: unchanged    MDM   Final diagnoses:  Fever, unspecified fever cause  Leukocytosis   Bronchitis    88 here with cough and fever. Periportal historian, states he's not feeling well. Patient came from a nursing home. Here rectally febrile to 100.8. Vitals otherwise stable. Right lung with rhonchi, concern for pneumonia. Chest x-ray is normal. Does have history of COPD, will treat for bronchitis with Levaquin. Fever is improving, urine normal, mild leukocytosis of 15, otherwise labs okay. He would like to go home and is feeling much better with his fever improving. Patient stable for discharge, can follow-up with PCP in 2 days.    Evelina Bucy, MD 12/21/13 [id number]

## 2013-12-21 NOTE — ED Notes (Signed)
Assisted patient with attempting to void on side of bed with urinal, and while standing.  No success, although patient has the urge. Performed bladder scan which showed 157 retained. Completed in and out cath with emt assist.

## 2013-12-21 NOTE — Discharge Instructions (Signed)
Cough, Adult ° A cough is a reflex that helps clear your throat and airways. It can help heal the body or may be a reaction to an irritated airway. A cough may only last 2 or 3 weeks (acute) or may last more than 8 weeks (chronic).  °CAUSES °Acute cough: °· Viral or bacterial infections. °Chronic cough: °· Infections. °· Allergies. °· Asthma. °· Post-nasal drip. °· Smoking. °· Heartburn or acid reflux. °· Some medicines. °· Chronic lung problems (COPD). °· Cancer. °SYMPTOMS  °· Cough. °· Fever. °· Chest pain. °· Increased breathing rate. °· High-pitched whistling sound when breathing (wheezing). °· Colored mucus that you cough up (sputum). °TREATMENT  °· A bacterial cough may be treated with antibiotic medicine. °· A viral cough must run its course and will not respond to antibiotics. °· Your caregiver may recommend other treatments if you have a chronic cough. °HOME CARE INSTRUCTIONS  °· Only take over-the-counter or prescription medicines for pain, discomfort, or fever as directed by your caregiver. Use cough suppressants only as directed by your caregiver. °· Use a cold steam vaporizer or humidifier in your bedroom or home to help loosen secretions. °· Sleep in a semi-upright position if your cough is worse at night. °· Rest as needed. °· Stop smoking if you smoke. °SEEK IMMEDIATE MEDICAL CARE IF:  °· You have pus in your sputum. °· Your cough starts to worsen. °· You cannot control your cough with suppressants and are losing sleep. °· You begin coughing up blood. °· You have difficulty breathing. °· You develop pain which is getting worse or is uncontrolled with medicine. °· You have a fever. °MAKE SURE YOU:  °· Understand these instructions. °· Will watch your condition. °· Will get help right away if you are not doing well or get worse. °Document Released: 06/29/2010 Document Revised: 03/25/2011 Document Reviewed: 06/29/2010 °ExitCare® Patient Information ©2015 [employer]. This information is not intended  to replace advice given to you by your health care provider. Make sure you discuss any questions you have with your health care provider. ° °Fever, Adult °A fever is a higher than normal body temperature. In an adult, an oral temperature around 98.6° F (37° C) is considered normal. A temperature of 100.4° F (38° C) or higher is generally considered a fever. Mild or moderate fevers generally have no long-term effects and often do not require treatment. Extreme fever (greater than or equal to 106° F or 41.1° C) can cause seizures. The sweating that may occur with repeated or prolonged fever may cause dehydration. Elderly people can develop confusion during a fever. °A measured temperature can vary with: °· Age. °· Time of day. °· Method of measurement (mouth, underarm, rectal, or ear). °The fever is confirmed by taking a temperature with a thermometer. Temperatures can be taken different ways. Some methods are accurate and some are not. °· An oral temperature is used most commonly. Electronic thermometers are fast and accurate. °· An ear temperature will only be accurate if the thermometer is positioned as recommended by the manufacturer. °· A rectal temperature is accurate and done for those adults who have a condition where an oral temperature cannot be taken. °· An underarm (axillary) temperature is not accurate and not recommended. °Fever is a symptom, not a disease.  °CAUSES  °· Infections commonly cause fever. °· Some noninfectious causes for fever include: °¨ Some arthritis conditions. °¨ Some thyroid or adrenal gland conditions. °¨ Some immune system conditions. °¨ Some types of cancer. °¨   A medicine reaction. °¨ High doses of certain street drugs such as methamphetamine. °¨ Dehydration. °¨ Exposure to high outside or room temperatures. °· Occasionally, the source of a fever cannot be determined. This is sometimes called a "fever of unknown origin" (FUO). °· Some situations may lead to a temporary rise in body  temperature that may go away on its own. Examples are: °¨ Childbirth. °¨ Surgery. °¨ Intense exercise. °HOME CARE INSTRUCTIONS  °· Take appropriate medicines for fever. Follow dosing instructions carefully. If you use acetaminophen to reduce the fever, be careful to avoid taking other medicines that also contain acetaminophen. Do not take aspirin for a fever if you are younger than age 19. There is an association with Reye's syndrome. Reye's syndrome is a rare but potentially deadly disease. °· If an infection is present and antibiotics have been prescribed, take them as directed. Finish them even if you start to feel better. °· Rest as needed. °· Maintain an adequate fluid intake. To prevent dehydration during an illness with prolonged or recurrent fever, you may need to drink extra fluid. Drink enough fluids to keep your urine clear or pale yellow. °· Sponging or bathing with room temperature water may help reduce body temperature. Do not use ice water or alcohol sponge baths. °· Dress comfortably, but do not over-bundle. °SEEK MEDICAL CARE IF:  °· You are unable to keep fluids down. °· You develop vomiting or diarrhea. °· You are not feeling at least partly better after 3 days. °· You develop new symptoms or problems. °SEEK IMMEDIATE MEDICAL CARE IF:  °· You have shortness of breath or trouble breathing. °· You develop excessive weakness. °· You are dizzy or you faint. °· You are extremely thirsty or you are making little or no urine. °· You develop new pain that was not there before (such as in the head, neck, chest, back, or abdomen). °· You have persistent vomiting and diarrhea for more than 1 to 2 days. °· You develop a stiff neck or your eyes become sensitive to light. °· You develop a skin rash. °· You have a fever or persistent symptoms for more than 2 to 3 days. °· You have a fever and your symptoms suddenly get worse. °MAKE SURE YOU:  °· Understand these instructions. °· Will watch your  condition. °· Will get help right away if you are not doing well or get worse. °Document Released: 06/26/2000 Document Revised: 05/17/2013 Document Reviewed: 11/01/2010 °ExitCare® Patient Information ©2015 [employer]. This information is not intended to replace advice given to you by your health care provider. Make sure you discuss any questions you have with your health care provider. ° °

## 2013-12-21 NOTE — ED Notes (Signed)
Patient has called another relative for transport, and does not want to wait for [facility] transport. Secretary cancelled p[facility] transport.

## 2013-12-21 NOTE — ED Notes (Signed)
Called [facility], spoke to [employer]. No one available to pick up the patient at this time. Secretary called [facility] for transport.

## 2013-12-21 NOTE — ED Notes (Signed)
Dr.Walden at the bedside.  

## 2013-12-27 LAB — CULTURE, BLOOD (ROUTINE X 2)
Culture: NO GROWTH
Culture: NO GROWTH

## 2014-01-19 ENCOUNTER — Encounter (INDEPENDENT_AMBULATORY_CARE_PROVIDER_SITE_OTHER): Payer: Medicare Other | Admitting: Ophthalmology

## 2014-01-19 DIAGNOSIS — H3531 Nonexudative age-related macular degeneration: Secondary | ICD-10-CM

## 2014-01-19 DIAGNOSIS — H43813 Vitreous degeneration, bilateral: Secondary | ICD-10-CM

## 2014-01-19 DIAGNOSIS — H3532 Exudative age-related macular degeneration: Secondary | ICD-10-CM

## 2014-01-20 ENCOUNTER — Ambulatory Visit: Payer: Medicare Other | Admitting: Hematology and Oncology

## 2014-01-20 ENCOUNTER — Other Ambulatory Visit: Payer: Medicare Other

## 2014-02-16 ENCOUNTER — Encounter (INDEPENDENT_AMBULATORY_CARE_PROVIDER_SITE_OTHER): Payer: Medicare Other | Admitting: Ophthalmology

## 2014-02-16 DIAGNOSIS — H43813 Vitreous degeneration, bilateral: Secondary | ICD-10-CM

## 2014-02-16 DIAGNOSIS — H3531 Nonexudative age-related macular degeneration: Secondary | ICD-10-CM

## 2014-02-16 DIAGNOSIS — H3532 Exudative age-related macular degeneration: Secondary | ICD-10-CM

## 2014-03-14 ENCOUNTER — Encounter (INDEPENDENT_AMBULATORY_CARE_PROVIDER_SITE_OTHER): Payer: Medicare Other | Admitting: Ophthalmology

## 2014-03-14 DIAGNOSIS — H43813 Vitreous degeneration, bilateral: Secondary | ICD-10-CM | POA: Diagnosis not present

## 2014-03-14 DIAGNOSIS — H3531 Nonexudative age-related macular degeneration: Secondary | ICD-10-CM | POA: Diagnosis not present

## 2014-03-14 DIAGNOSIS — H3532 Exudative age-related macular degeneration: Secondary | ICD-10-CM | POA: Diagnosis not present

## 2014-04-04 ENCOUNTER — Encounter (INDEPENDENT_AMBULATORY_CARE_PROVIDER_SITE_OTHER): Payer: Medicare Other | Admitting: Ophthalmology

## 2014-04-04 DIAGNOSIS — H43813 Vitreous degeneration, bilateral: Secondary | ICD-10-CM | POA: Diagnosis not present

## 2014-04-04 DIAGNOSIS — H3532 Exudative age-related macular degeneration: Secondary | ICD-10-CM

## 2014-04-04 DIAGNOSIS — H3531 Nonexudative age-related macular degeneration: Secondary | ICD-10-CM | POA: Diagnosis not present

## 2014-04-19 ENCOUNTER — Emergency Department (HOSPITAL_COMMUNITY)
Admission: EM | Admit: 2014-04-19 | Discharge: 2014-04-19 | Disposition: A | Discharge status: Home / Self Care | Payer: Medicare Other | Attending: Emergency Medicine | Admitting: Emergency Medicine

## 2014-04-19 ENCOUNTER — Encounter (HOSPITAL_COMMUNITY): Payer: Self-pay

## 2014-04-19 DIAGNOSIS — Z792 Long term (current) use of antibiotics: Secondary | ICD-10-CM | POA: Insufficient documentation

## 2014-04-19 DIAGNOSIS — K5900 Constipation, unspecified: Secondary | ICD-10-CM

## 2014-04-19 DIAGNOSIS — N182 Chronic kidney disease, stage 2 (mild): Secondary | ICD-10-CM | POA: Diagnosis not present

## 2014-04-19 DIAGNOSIS — N390 Urinary tract infection, site not specified: Secondary | ICD-10-CM | POA: Diagnosis not present

## 2014-04-19 DIAGNOSIS — Z862 Personal history of diseases of the blood and blood-forming organs and certain disorders involving the immune mechanism: Secondary | ICD-10-CM | POA: Insufficient documentation

## 2014-04-19 DIAGNOSIS — Z8709 Personal history of other diseases of the respiratory system: Secondary | ICD-10-CM | POA: Insufficient documentation

## 2014-04-19 DIAGNOSIS — R4182 Altered mental status, unspecified: Secondary | ICD-10-CM | POA: Diagnosis not present

## 2014-04-19 DIAGNOSIS — R404 Transient alteration of awareness: Secondary | ICD-10-CM

## 2014-04-19 DIAGNOSIS — D631 Anemia in chronic kidney disease: Secondary | ICD-10-CM

## 2014-04-19 DIAGNOSIS — N189 Chronic kidney disease, unspecified: Secondary | ICD-10-CM

## 2014-04-19 DIAGNOSIS — Z87891 Personal history of nicotine dependence: Secondary | ICD-10-CM | POA: Insufficient documentation

## 2014-04-19 DIAGNOSIS — I129 Hypertensive chronic kidney disease with stage 1 through stage 4 chronic kidney disease, or unspecified chronic kidney disease: Secondary | ICD-10-CM | POA: Insufficient documentation

## 2014-04-19 LAB — URINE MICROSCOPIC-ADD ON

## 2014-04-19 LAB — URINALYSIS, ROUTINE W REFLEX MICROSCOPIC
Bilirubin Urine: NEGATIVE
GLUCOSE, UA: NEGATIVE mg/dL
Hgb urine dipstick: NEGATIVE
Ketones, ur: NEGATIVE mg/dL
LEUKOCYTES UA: NEGATIVE
Nitrite: NEGATIVE
PH: 5.5 (ref 5.0–8.0)
Protein, ur: 30 mg/dL — AB
SPECIFIC GRAVITY, URINE: 1.023 (ref 1.005–1.030)
Urobilinogen, UA: 0.2 mg/dL (ref 0.0–1.0)

## 2014-04-19 LAB — COMPREHENSIVE METABOLIC PANEL
ALT: 15 U/L (ref 0–53)
AST: 23 U/L (ref 0–37)
Albumin: 3.3 g/dL — ABNORMAL LOW (ref 3.5–5.2)
Alkaline Phosphatase: 76 U/L (ref 39–117)
Anion gap: 5 (ref 5–15)
BILIRUBIN TOTAL: 0.7 mg/dL (ref 0.3–1.2)
BUN: 28 mg/dL — ABNORMAL HIGH (ref 6–23)
CHLORIDE: 103 mmol/L (ref 96–112)
CO2: 29 mmol/L (ref 19–32)
Calcium: 8.8 mg/dL (ref 8.4–10.5)
Creatinine, Ser: 1.49 mg/dL — ABNORMAL HIGH (ref 0.50–1.35)
GFR calc Af Amer: 47 mL/min — ABNORMAL LOW (ref >90)
GFR calc non Af Amer: 41 mL/min — ABNORMAL LOW (ref >90)
Glucose, Bld: 116 mg/dL — ABNORMAL HIGH (ref 70–99)
POTASSIUM: 3.9 mmol/L (ref 3.5–5.1)
SODIUM: 137 mmol/L (ref 135–145)
Total Protein: 6.7 g/dL (ref 6.0–8.3)

## 2014-04-19 LAB — CBG MONITORING, ED: GLUCOSE-CAPILLARY: 113 mg/dL — AB (ref 70–99)

## 2014-04-19 LAB — CBC
HEMATOCRIT: 36.9 % — AB (ref 39.0–52.0)
Hemoglobin: 11.9 g/dL — ABNORMAL LOW (ref 13.0–17.0)
MCH: 29.7 pg (ref 26.0–34.0)
MCHC: 32.2 g/dL (ref 30.0–36.0)
MCV: 92 fL (ref 78.0–100.0)
Platelets: 192 10*3/uL (ref 150–400)
RBC: 4.01 MIL/uL — ABNORMAL LOW (ref 4.22–5.81)
RDW: 13.6 % (ref 11.5–15.5)
WBC: 7.7 10*3/uL (ref 4.0–10.5)

## 2014-04-19 MED ORDER — CEPHALEXIN 500 MG PO CAPS: 500.0000 mg | ORAL_CAPSULE | Freq: Two times a day (BID) | ORAL | Status: DC

## 2014-04-19 NOTE — ED Notes (Signed)
Per [facility], pt from [facility] for difficulty waking up and confusion this morning. Wife had difficulty arousing this morning and called 911. Informed EMS they were watching the game last night and he fell asleep between 2200 and 2300. When he did wake up this morning he was confused and c/o numbness all over. Wife reported that he was breathing a little heavy last night. 20g to [place]. CBG of 112 by EMS. Pt denies any pain. Alert and oriented to all but time.

## 2014-04-19 NOTE — ED Notes (Signed)
PA at bedside.

## 2014-04-19 NOTE — ED Notes (Signed)
Ricardo Allen -[phone]

## 2014-04-19 NOTE — Discharge Instructions (Signed)
Stay very well hydrated with plenty of water throughout the day. Take antibiotic until completed. Continue taking your miralax daily, may consider increasing dose to twice daily until you have a soft bowel movement, then cutting back down to once daily. Follow up with primary care physician in 1 week for recheck of ongoing symptoms but return to ER for emergent changing or worsening of symptoms. Please seek immediate care if you develop the following: You develop back pain.  Your symptoms are no better, or worse in 3 days. There is severe back pain or lower abdominal pain.  You develop chills.  You have a fever.  There is nausea or vomiting.  There is continued burning or discomfort with urination.     Altered Mental Status Altered mental status most often refers to an abnormal change in your responsiveness and awareness. It can affect your speech, thought, mobility, memory, attention span, or alertness. It can range from slight confusion to complete unresponsiveness (coma). Altered mental status can be a sign of a serious underlying medical condition. Rapid evaluation and medical treatment is necessary for patients having an altered mental status. CAUSES   Low blood sugar (hypoglycemia) or diabetes.  Severe loss of body fluids (dehydration) or a body salt (electrolyte) imbalance.  A stroke or other neurologic problem, such as dementia or delirium.  A head injury or tumor.  A drug or alcohol overdose.  Exposure to toxins or poisons.  Depression, anxiety, and stress.  A low oxygen level (hypoxia).  An infection.  Blood loss.  Twitching or shaking (seizure).  Heart problems, such as heart attack or heart rhythm problems (arrhythmias).  A body temperature that is too low or too high (hypothermia or hyperthermia). DIAGNOSIS  A diagnosis is based on your history, symptoms, physical and neurologic examinations, and diagnostic tests. Diagnostic tests may include:  Measurement of  your blood pressure, pulse, breathing, and oxygen levels (vital signs).  Blood tests.  Urine tests.  X-ray exams.  A computerized magnetic scan (magnetic resonance imaging, MRI).  A computerized X-ray scan (computed tomography, CT scan). TREATMENT  Treatment will depend on the cause. Treatment may include:  Management of an underlying medical or mental health condition.  Critical care or support in the hospital. [place]   Only take over-the-counter or prescription medicines for pain, discomfort, or fever as directed by your caregiver.  Manage underlying conditions as directed by your caregiver.  Eat a healthy, well-balanced diet to maintain strength.  Join a support group or prevention program to cope with the condition or trauma that caused the altered mental status. Ask your caregiver to help choose a program that works for you.  Follow up with your caregiver for further examination, therapy, or testing as directed. SEEK MEDICAL CARE IF:   You feel unwell or have chills.  You or your family notice a change in your behavior or your alertness.  You have trouble following your caregiver's treatment plan.  You have questions or concerns. SEEK IMMEDIATE MEDICAL CARE IF:   You have a rapid heartbeat or have chest pain.  You have difficulty breathing.  You have a fever.  You have a headache with a stiff neck.  You cough up blood.  You have blood in your urine or stool.  You have severe agitation or confusion. MAKE SURE YOU:   Understand these instructions.  Will watch your condition.  Will get help right away if you are not doing well or get worse. Document Released: 06/20/2009 Document  Revised: 03/25/2011 Document Reviewed: 06/20/2009 [employer] Patient Information 2015 [place], [place]. This information is not intended to replace advice given to you by your health care provider. Make sure you discuss any questions you have with your health care  provider.  Constipation Constipation is when a person:  Poops (has a bowel movement) less than 3 times a week.  Has a hard time pooping.  Has poop that is dry, hard, or bigger than normal. HOME CARE   Eat foods with a lot of fiber in them. This includes fruits, vegetables, beans, and whole grains such as brown rice.  Avoid fatty foods and foods with a lot of sugar. This includes french fries, hamburgers, cookies, candy, and soda.  If you are not getting enough fiber from food, take products with added fiber in them (supplements).  Drink enough fluid to keep your pee (urine) clear or pale yellow.  Exercise on a regular basis, or as told by your doctor.  Go to the restroom when you feel like you need to poop. Do not hold it.  Only take medicine as told by your doctor. Do not take medicines that help you poop (laxatives) without talking to your doctor first. GET HELP RIGHT AWAY IF:   You have bright red blood in your poop (stool).  Your constipation lasts more than 4 days or gets worse.  You have belly (abdominal) or butt (rectal) pain.  You have thin poop (as thin as a pencil).  You lose weight, and it cannot be explained. MAKE SURE YOU:   Understand these instructions.  Will watch your condition.  Will get help right away if you are not doing well or get worse. Document Released: 06/19/2007 Document Revised: 01/05/2013 Document Reviewed: 10/12/2012 [facility] Patient Information 2015 [place], [place]. This information is not intended to replace advice given to you by your health care provider. Make sure you discuss any questions you have with your health care provider.  Urinary Tract Infection A urinary tract infection (UTI) can occur any place along the urinary tract. The tract includes the kidneys, ureters, bladder, and urethra. A type of germ called bacteria often causes a UTI. UTIs are often helped with antibiotic medicine.  HOME CARE   If given, take antibiotics as  told by your doctor. Finish them even if you start to feel better.  Drink enough fluids to keep your pee (urine) clear or pale yellow.  Avoid tea, drinks with caffeine, and bubbly (carbonated) drinks.  Pee often. Avoid holding your pee in for a long time.  Pee before and after having sex (intercourse).  Wipe from front to back after you poop (bowel movement) if you are a woman. Use each tissue only once. GET HELP RIGHT AWAY IF:   You have back pain.  You have lower belly (abdominal) pain.  You have chills.  You feel sick to your stomach (nauseous).  You throw up (vomit).  Your burning or discomfort with peeing does not go away.  You have a fever.  Your symptoms are not better in 3 days. MAKE SURE YOU:   Understand these instructions.  Will watch your condition.  Will get help right away if you are not doing well or get worse. Document Released: 06/19/2007 Document Revised: 09/25/2011 Document Reviewed: 08/01/2011 [facility] Patient Information 2015 [place], [place]. This information is not intended to replace advice given to you by your health care provider. Make sure you discuss any questions you have with your health care provider.

## 2014-04-19 NOTE — ED Provider Notes (Signed)
CSN: [id number]     Arrival date & time 04/19/14  1032 History   First MD Initiated Contact with Patient 04/19/14 1102     Chief Complaint  Patient presents with  . Altered Mental Status     (Consider location/radiation/quality/duration/timing/severity/associated sxs/prior Treatment) HPI Comments: Ricardo Allen is a 81 y.o. male with a PMHx of afib not on anticoagulation, lumbar disc disease with myelopathy, gout, HLD, orthostatic hypotension, lung nodule, malignant melanoma s/p resection, OSA, wet senile macular degeneration, diverticulosis, and anemia of chronic disease, who presents to the ED via EMS from [facility], called out by the patient's wife for difficulty arousing him this morning and confusion. He was last seen normal when he fell asleep at 10 PM yesterday night. Per EMS report, his wife states that he was "breathing a little heavy" last night, the patient has a history of sleep apnea. The patient denies remembering her attempts to arouse him, he states he awoke with "a ton of people in the apartment". His only complaints today are lower abdominal pain that he describes as a sensation "like I need to go to the bathroom", stating that he has been constipated which is a chronic issue, and he has not had a "good bowel movement" in 3-4 days. Takes miralax chronically. He states that when he attempts to have a bowel movement, he gets "small pellets". He states that due to his constipation he has had some urinary hesitancy, stating that he feels like he needs to "push" his urine out, and he oftentimes has to return to the restroom in order to urinate again because he did not completely empty his bladder. He denies any fevers, chills, chest pain, shortness of breath, diarrhea, melena, hematochezia, nausea, vomiting, hematuria, dysuria, urinary retention, malodorous urine, numbness, tingling, weakness, headache, vision changes, lightheadedness, dizziness, syncope,  medication changes, or current confusion. His pain doctor is Dr. Hardin Negus who prescribes morphine sulfate every 12 hours and he states that this is causing his chronic constipation. No recent falls, head trauma, and not on blood thinners. Did not wear his sleep apnea machine last night. Wife and daughter at bedside state he is back to baseline.  Patient is a 81 y.o. male presenting with altered mental status. The history is provided by the patient and the EMS personnel. The history is limited by the condition of the patient. No language interpreter was used.  Altered Mental Status Presenting symptoms: confusion (per wife, difficulty with arousing and confused, but now resolved)   Severity:  Mild Most recent episode:  Today Episode history:  Single Timing:  Rare Progression:  Resolved Chronicity:  New Context: not alcohol use, not dementia, not head injury, not a recent change in medication and not a recent illness   Associated symptoms: abdominal pain (mild, lower, "like I need to go to the bathroom")   Associated symptoms: normal movement, no bladder incontinence, no difficulty breathing, no eye deviation, no fever, no headaches, no light-headedness, no nausea, no rash, no seizures, no visual change, no vomiting and no weakness     Past Medical History  Diagnosis Date  . Atrial fibrillation 11/27/2009  . DEPRESSION 08/27/2006  . DISORDER, LUMBAR DISC W/MYELOPATHY 08/19/2006  . GOUT 03/22/2009  . HYPERLIPIDEMIA 08/27/2006  . HYPOTENSION, ORTHOSTATIC 10/31/2009  . Impaired fasting glucose 01/27/2007  . LOW BACK PAIN 02/03/2007    Lumbar radiculopathy 09/2006  . LUNG NODULE 03/29/2008  . MELANOMA, MALIGNANT, TRUNK 08/19/2006    early stage; just exicion.   Marland Kitchen  OBSTRUCTIVE SLEEP APNEA 08/19/2006  . TRAUMATIC ARTHROPATHY PELVIC REGION AND THIGH 06/29/2009  . TOBACCO ABUSE 12/31/2007  . Wet senile macular degeneration   . Diverticulosis   . Anemia of other chronic disease 2008    EGD and colonoscopy in  2014 with diverticulosis only  . 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 Cataract extraction Bilateral   . Lumbar laminectomy    . Lumbar fusion    . Tonsilectomy, adenoidectomy, bilateral myringotomy and tubes    . Melanoma excision      left lower back  . Esophagogastroduodenoscopy N/A 06/20/2012    Procedure: ESOPHAGOGASTRODUODENOSCOPY (EGD);  Surgeon: Inda Castle, MD;  Location: [facility];  Service: Endoscopy;  Laterality: N/A;  . Colonoscopy N/A 06/21/2012    Procedure: COLONOSCOPY;  Surgeon: Inda Castle, MD;  Location: [facility];  Service: Endoscopy;  Laterality: N/A;   Family History  Problem Relation Age of Onset  . Pancreatic cancer Mother   . Pancreatic cancer Mother   . Testicular cancer Father   . Arthritis Brother   . Cancer Maternal Aunt     head/neck cancer   History  Substance Use Topics  . Smoking status: Former Smoker -- 2.00 packs/day for 60 years    Types: Cigarettes    Quit date: 04/14/2008  . Smokeless tobacco: Never Used  . Alcohol Use: 4.4 oz/week    4 Glasses of wine, 4 Standard drinks or equivalent per week    Review of Systems  Constitutional: Negative for fever and chills.  Eyes: Negative for visual disturbance.  Respiratory: Negative for shortness of breath.   Cardiovascular: Negative for chest pain.  Gastrointestinal: Positive for abdominal pain (mild, lower, "like I need to go to the bathroom") and constipation. Negative for nausea, vomiting, diarrhea, blood in stool and anal bleeding.  Genitourinary: Positive for difficulty urinating (hesitancy but without retention). Negative for bladder incontinence, dysuria, frequency, hematuria and flank pain.  Musculoskeletal: Negative for myalgias, arthralgias and neck pain.  Skin: Negative for color change and rash.  Neurological: Negative for dizziness, seizures, syncope, facial asymmetry, speech difficulty, weakness,  light-headedness, numbness and headaches.  Hematological: Does not bruise/bleed easily.  Psychiatric/Behavioral: Positive for confusion (per wife, difficulty with arousing and confused, but now resolved).   10 Systems reviewed and are negative for acute change except as noted in the HPI.    Allergies  Fentanyl; Hydromorphone hcl; and Sulfonamide derivatives  Home Medications   Prior to Admission medications   Medication Sig Start Date End Date Taking? Authorizing Provider  alendronate (FOSAMAX) 70 MG tablet Take 70 mg by mouth every Sunday. Take with a full glass of water on an empty stomach.    Historical Provider, MD  allopurinol (ZYLOPRIM) 100 MG tablet Take 100 mg by mouth daily as needed (for gout).    Historical Provider, MD  amiodarone (PACERONE) 200 MG tablet Take 200 mg by mouth daily.    Historical Provider, MD  cephALEXin (KEFLEX) 500 MG capsule Take 1 capsule (500 mg total) by mouth 3 (three) times daily. 10/02/13   Davonna Belling, MD  citalopram (CELEXA) 20 MG tablet Take 20 mg by mouth daily.    Historical Provider, MD  colchicine 0.6 MG tablet Take 0.6 mg by mouth daily as needed (for gout).    Historical Provider, MD  docusate sodium (COLACE) 100 MG capsule Take 100 mg by mouth daily as needed for mild constipation.  Historical Provider, MD  HYDROcodone-acetaminophen (NORCO) 10-325 MG per tablet Take 1 tablet by mouth every 6 (six) hours as needed for moderate pain.    Historical Provider, MD  levofloxacin (LEVAQUIN) 750 MG tablet Take 1 tablet (750 mg total) by mouth daily. X 7 days 12/21/13   Evelina Bucy, MD  metoprolol tartrate (LOPRESSOR) 25 MG tablet Take 25 mg by mouth 2 (two) times daily.    Historical Provider, MD  morphine (MS CONTIN) 30 MG 12 hr tablet Take 30 mg by mouth every 12 (twelve) hours.    Historical Provider, MD  pantoprazole (PROTONIX) 40 MG tablet Take 40 mg by mouth daily.    Historical Provider, MD  polyethylene glycol (MIRALAX / GLYCOLAX) packet  Take 17 g by mouth daily as needed (constipation).    Historical Provider, MD  Probiotic Product (PROBIOTIC DAILY PO) Take 1 capsule by mouth daily.     Historical Provider, MD  Testosterone (ANDROGEL) 20.25 MG/1.25GM (1.62%) GEL Place 1 application onto the skin daily.    Historical Provider, MD   BP 157/73 mmHg  Pulse 84  Temp(Src) 98.4 F (36.9 C) (Oral)  Resp 13  SpO2 99% Physical Exam  Constitutional: He is oriented to person, place, and time. Vital signs are normal. He appears well-developed and well-nourished.  Non-toxic appearance. No distress.  Afebrile, nontoxic, NAD, pleasant and cooperative  HENT:  Head: Normocephalic and atraumatic.  Nose: Nose normal.  Mouth/Throat: Uvula is midline, oropharynx is clear and moist and mucous membranes are normal.  No facial asymmetry, uvula midline, no tongue protrusion asymmetry  Eyes: Conjunctivae and EOM are normal. Pupils are equal, round, and reactive to light. Right eye exhibits no discharge. Left eye exhibits no discharge.  PERRL, EOMI, no nystagmus, no visual field deficits  Neck: Normal range of motion. Neck supple.  FROM intact without spinous process or paraspinous muscle TTP, no bony stepoffs or deformities, no muscle spasms. No rigidity or meningeal signs.  Cardiovascular: Normal rate, regular rhythm, normal heart sounds and intact distal pulses.  Exam reveals no gallop and no friction rub.   No murmur heard. RRR, nl s1/s2, no m/r/g, distal pulses intact, no pedal edema although pt wearing compression stockings  Pulmonary/Chest: Effort normal and breath sounds normal. No respiratory distress. He has no decreased breath sounds. He has no wheezes. He has no rhonchi. He has no rales.  Abdominal: Soft. Normal appearance and bowel sounds are normal. He exhibits no distension. There is tenderness in the suprapubic area and left lower quadrant. There is no rigidity, no rebound, no guarding, no CVA tenderness, no tenderness at McBurney's  point and negative Murphy's sign.    Soft, nondistended, +BS throughout, with very mild lower abdominal tenderness mostly over LLQ and suprapubic area, no r/g/r, neg murphy's, neg mcburney's, no CVA TTP   Musculoskeletal: Normal range of motion.  MAE x4 symmetrically Strength and sensation grossly intact Distal pulses intact No pedal edema, neg homan's bilaterally   Neurological: He is alert and oriented to person, place, and time. He has normal strength. No sensory deficit. Coordination normal. GCS eye subscore is 4. GCS verbal subscore is 5. GCS motor subscore is 6.  CN 2-12 grossly intact A&O x4 GCS 15 Sensation and strength intact Coordination WNL  Skin: Skin is warm, dry and intact. No rash noted.  Psychiatric: He has a normal mood and affect. Cognition and memory are normal.  Cognition and memory intact  Nursing note and vitals reviewed.   ED Course  Procedures (  including critical care time) Labs Review Labs Reviewed  CBC - Abnormal; Notable for the following:    RBC 4.01 (*)    Hemoglobin 11.9 (*)    HCT 36.9 (*)    All other components within normal limits  COMPREHENSIVE METABOLIC PANEL - Abnormal; Notable for the following:    Glucose, Bld 116 (*)    BUN 28 (*)    Creatinine, Ser 1.49 (*)    Albumin 3.3 (*)    GFR calc non Af Amer 41 (*)    GFR calc Af Amer 47 (*)    All other components within normal limits  URINALYSIS, ROUTINE W REFLEX MICROSCOPIC - Abnormal; Notable for the following:    Protein, ur 30 (*)    All other components within normal limits  URINE MICROSCOPIC-ADD ON - Abnormal; Notable for the following:    Bacteria, UA FEW (*)    All other components within normal limits  CBG MONITORING, ED - Abnormal; Notable for the following:    Glucose-Capillary 113 (*)    All other components within normal limits  URINE CULTURE    Imaging Review No results found.   EKG Interpretation   Date/Time:  Tuesday April 19 2014 10:42:05 EDT Ventricular Rate:   84 PR Interval:  161 QRS Duration: 149 QT Interval:  419 QTC Calculation: 495 R Axis:   -50 Text Interpretation:  Sinus rhythm RBBB and LAFB No significant change  since last tracing Confirmed by DOCHERTY  MD, MEGAN ([id number]) on 04/19/2014  10:50:00 AM      MDM   Final diagnoses:  Transient alteration of awareness  UTI (lower urinary tract infection)  Constipation, unspecified constipation type  Chronic kidney disease, stage 2 (mild)  Anemia in chronic renal disease    81 y.o. male here for reports of confusion and difficulty being awoken per his wife. Upon exam, pt is neurovascularly intact with no focal neuro deficits, A&O x4. He has slight lower abdominal tenderness, states he hasn't had a "good BM" in 3-4 days, on chronic narcotics and has chronic constipation issues, nonperitoneal exam, doubt need for imaging. EKG unchanged from prior. Will obtain labs and u/a. Doubt need for imaging since pt is back to baseline, likely source of his difficulty with arousal this morning was that he has not been compliant with his CPAP machine and is likely not sleeping well, he may have been in a deep sleep when wife was attempting to arouse him. Will still need labs and urine to determine if there is another source. Will reassess shortly.   1:41 PM CMP showing BUn 28 and Cr 1.49, close to baseline. CBC unremarkable. CBG 113. U/A with rare squamous, 0-2 WBC, and few bacteria. Given this finding, with his urinary hesitancy and questionable confusion, will treat for UTI using keflex, but will send for culture. Discussed continuation of miralax for his constipation. Discussed staying hydrated. Will have him f/up with his PCP in 1wk. I explained the diagnosis and have given explicit precautions to return to the ER including for any other new or worsening symptoms. The patient understands and accepts the medical plan as it's been dictated and I have answered their questions. Discharge instructions concerning  home care and prescriptions have been given. The patient is STABLE and is discharged to home in good condition.  BP 150/67 mmHg  Pulse 81  Temp(Src) 98.4 F (36.9 C) (Oral)  Resp 17  SpO2 99%  Meds ordered this encounter  Medications  . cephALEXin (KEFLEX) 500  MG capsule    Sig: Take 1 capsule (500 mg total) by mouth 2 (two) times daily. X 7 days    Dispense:  14 capsule    Refill:  0    Order Specific Question:  Supervising Provider    Answer:  Noemi Chapel [[id number]]      Jauan Wohl Camprubi-Soms, PA-C 04/19/14 1353  Ernestina Patches, MD 04/20/14 2046

## 2014-04-20 LAB — URINE CULTURE
Colony Count: NO GROWTH
Culture: NO GROWTH
Special Requests: NORMAL

## 2014-05-02 ENCOUNTER — Encounter (INDEPENDENT_AMBULATORY_CARE_PROVIDER_SITE_OTHER): Payer: Medicare Other | Admitting: Ophthalmology

## 2014-05-02 DIAGNOSIS — H43813 Vitreous degeneration, bilateral: Secondary | ICD-10-CM | POA: Diagnosis not present

## 2014-05-02 DIAGNOSIS — H3531 Nonexudative age-related macular degeneration: Secondary | ICD-10-CM

## 2014-05-02 DIAGNOSIS — H3532 Exudative age-related macular degeneration: Secondary | ICD-10-CM | POA: Diagnosis not present

## 2014-05-30 ENCOUNTER — Encounter (INDEPENDENT_AMBULATORY_CARE_PROVIDER_SITE_OTHER): Payer: Medicare Other | Admitting: Ophthalmology

## 2014-05-30 DIAGNOSIS — H3531 Nonexudative age-related macular degeneration: Secondary | ICD-10-CM

## 2014-05-30 DIAGNOSIS — H3532 Exudative age-related macular degeneration: Secondary | ICD-10-CM

## 2014-05-30 DIAGNOSIS — H43813 Vitreous degeneration, bilateral: Secondary | ICD-10-CM | POA: Diagnosis not present

## 2014-06-27 ENCOUNTER — Encounter (INDEPENDENT_AMBULATORY_CARE_PROVIDER_SITE_OTHER): Payer: Medicare Other | Admitting: Ophthalmology

## 2014-06-27 DIAGNOSIS — H43813 Vitreous degeneration, bilateral: Secondary | ICD-10-CM | POA: Diagnosis not present

## 2014-06-27 DIAGNOSIS — H3531 Nonexudative age-related macular degeneration: Secondary | ICD-10-CM

## 2014-06-27 DIAGNOSIS — H3532 Exudative age-related macular degeneration: Secondary | ICD-10-CM

## 2014-07-11 ENCOUNTER — Other Ambulatory Visit: Payer: Self-pay

## 2014-07-25 ENCOUNTER — Encounter (INDEPENDENT_AMBULATORY_CARE_PROVIDER_SITE_OTHER): Payer: Medicare Other | Admitting: Ophthalmology

## 2014-07-25 DIAGNOSIS — H3532 Exudative age-related macular degeneration: Secondary | ICD-10-CM

## 2014-07-25 DIAGNOSIS — H43813 Vitreous degeneration, bilateral: Secondary | ICD-10-CM | POA: Diagnosis not present

## 2014-07-25 DIAGNOSIS — H3531 Nonexudative age-related macular degeneration: Secondary | ICD-10-CM | POA: Diagnosis not present

## 2014-08-22 ENCOUNTER — Encounter (INDEPENDENT_AMBULATORY_CARE_PROVIDER_SITE_OTHER): Payer: Medicare Other | Admitting: Ophthalmology

## 2014-08-22 DIAGNOSIS — H3532 Exudative age-related macular degeneration: Secondary | ICD-10-CM | POA: Diagnosis not present

## 2014-08-22 DIAGNOSIS — H3531 Nonexudative age-related macular degeneration: Secondary | ICD-10-CM

## 2014-08-22 DIAGNOSIS — H43813 Vitreous degeneration, bilateral: Secondary | ICD-10-CM | POA: Diagnosis not present

## 2014-09-21 ENCOUNTER — Encounter (INDEPENDENT_AMBULATORY_CARE_PROVIDER_SITE_OTHER): Payer: Medicare Other | Admitting: Ophthalmology

## 2014-09-21 DIAGNOSIS — H3531 Nonexudative age-related macular degeneration: Secondary | ICD-10-CM | POA: Diagnosis not present

## 2014-09-21 DIAGNOSIS — H3532 Exudative age-related macular degeneration: Secondary | ICD-10-CM

## 2014-09-21 DIAGNOSIS — H43813 Vitreous degeneration, bilateral: Secondary | ICD-10-CM

## 2014-09-28 ENCOUNTER — Encounter (INDEPENDENT_AMBULATORY_CARE_PROVIDER_SITE_OTHER): Payer: Medicare Other | Admitting: Ophthalmology

## 2014-10-19 ENCOUNTER — Encounter (INDEPENDENT_AMBULATORY_CARE_PROVIDER_SITE_OTHER): Payer: Medicare Other | Admitting: Ophthalmology

## 2014-10-19 DIAGNOSIS — H43813 Vitreous degeneration, bilateral: Secondary | ICD-10-CM

## 2014-10-19 DIAGNOSIS — H353231 Exudative age-related macular degeneration, bilateral, with active choroidal neovascularization: Secondary | ICD-10-CM | POA: Diagnosis not present

## 2014-11-16 ENCOUNTER — Encounter (INDEPENDENT_AMBULATORY_CARE_PROVIDER_SITE_OTHER): Payer: Medicare Other | Admitting: Ophthalmology

## 2014-11-16 DIAGNOSIS — H353213 Exudative age-related macular degeneration, right eye, with inactive scar: Secondary | ICD-10-CM | POA: Diagnosis not present

## 2014-11-16 DIAGNOSIS — H43813 Vitreous degeneration, bilateral: Secondary | ICD-10-CM

## 2014-12-14 ENCOUNTER — Encounter (INDEPENDENT_AMBULATORY_CARE_PROVIDER_SITE_OTHER): Payer: Medicare Other | Admitting: Ophthalmology

## 2014-12-14 DIAGNOSIS — H43813 Vitreous degeneration, bilateral: Secondary | ICD-10-CM | POA: Diagnosis not present

## 2014-12-14 DIAGNOSIS — H353231 Exudative age-related macular degeneration, bilateral, with active choroidal neovascularization: Secondary | ICD-10-CM

## 2015-01-04 ENCOUNTER — Encounter (INDEPENDENT_AMBULATORY_CARE_PROVIDER_SITE_OTHER): Payer: Medicare Other | Admitting: Ophthalmology

## 2015-01-04 DIAGNOSIS — H43813 Vitreous degeneration, bilateral: Secondary | ICD-10-CM | POA: Diagnosis not present

## 2015-01-04 DIAGNOSIS — H353231 Exudative age-related macular degeneration, bilateral, with active choroidal neovascularization: Secondary | ICD-10-CM

## 2015-02-01 ENCOUNTER — Encounter (INDEPENDENT_AMBULATORY_CARE_PROVIDER_SITE_OTHER): Payer: Medicare Other | Admitting: Ophthalmology

## 2015-02-08 ENCOUNTER — Encounter (INDEPENDENT_AMBULATORY_CARE_PROVIDER_SITE_OTHER): Payer: Medicare Other | Admitting: Ophthalmology

## 2015-02-08 DIAGNOSIS — H353231 Exudative age-related macular degeneration, bilateral, with active choroidal neovascularization: Secondary | ICD-10-CM

## 2015-02-08 DIAGNOSIS — H43813 Vitreous degeneration, bilateral: Secondary | ICD-10-CM | POA: Diagnosis not present

## 2015-03-02 ENCOUNTER — Encounter (INDEPENDENT_AMBULATORY_CARE_PROVIDER_SITE_OTHER): Payer: Medicare Other | Admitting: Ophthalmology

## 2015-03-02 DIAGNOSIS — H353231 Exudative age-related macular degeneration, bilateral, with active choroidal neovascularization: Secondary | ICD-10-CM

## 2015-03-02 DIAGNOSIS — H43813 Vitreous degeneration, bilateral: Secondary | ICD-10-CM | POA: Diagnosis not present

## 2015-03-27 IMAGING — CR DG CHEST 2V
2 series · 2 of 2 positions shown · non-contrast
Comparison: 03/28/2013

CLINICAL DATA: Cough and fever

EXAM:
CHEST  2 VIEW

[chest pa]
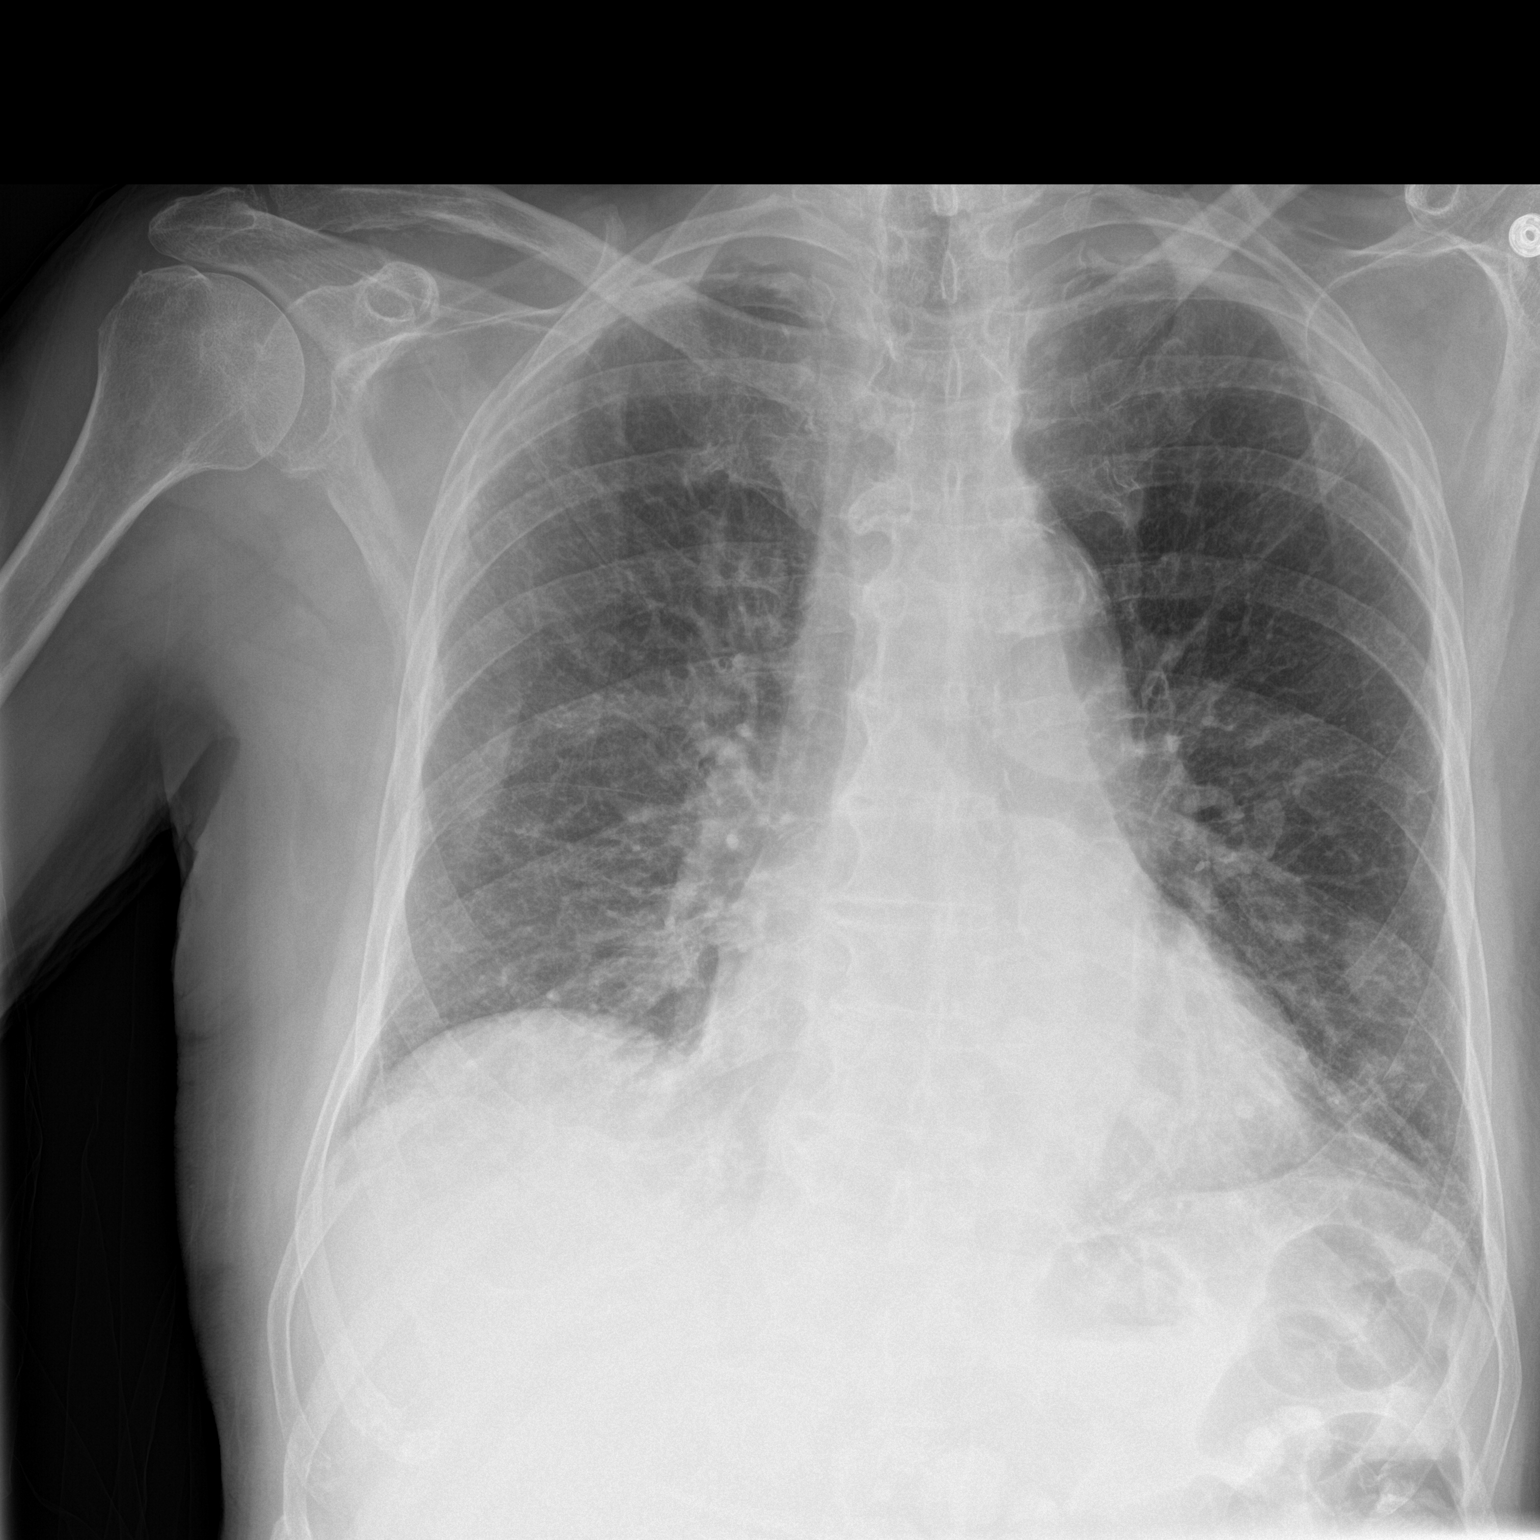

[chest lat]
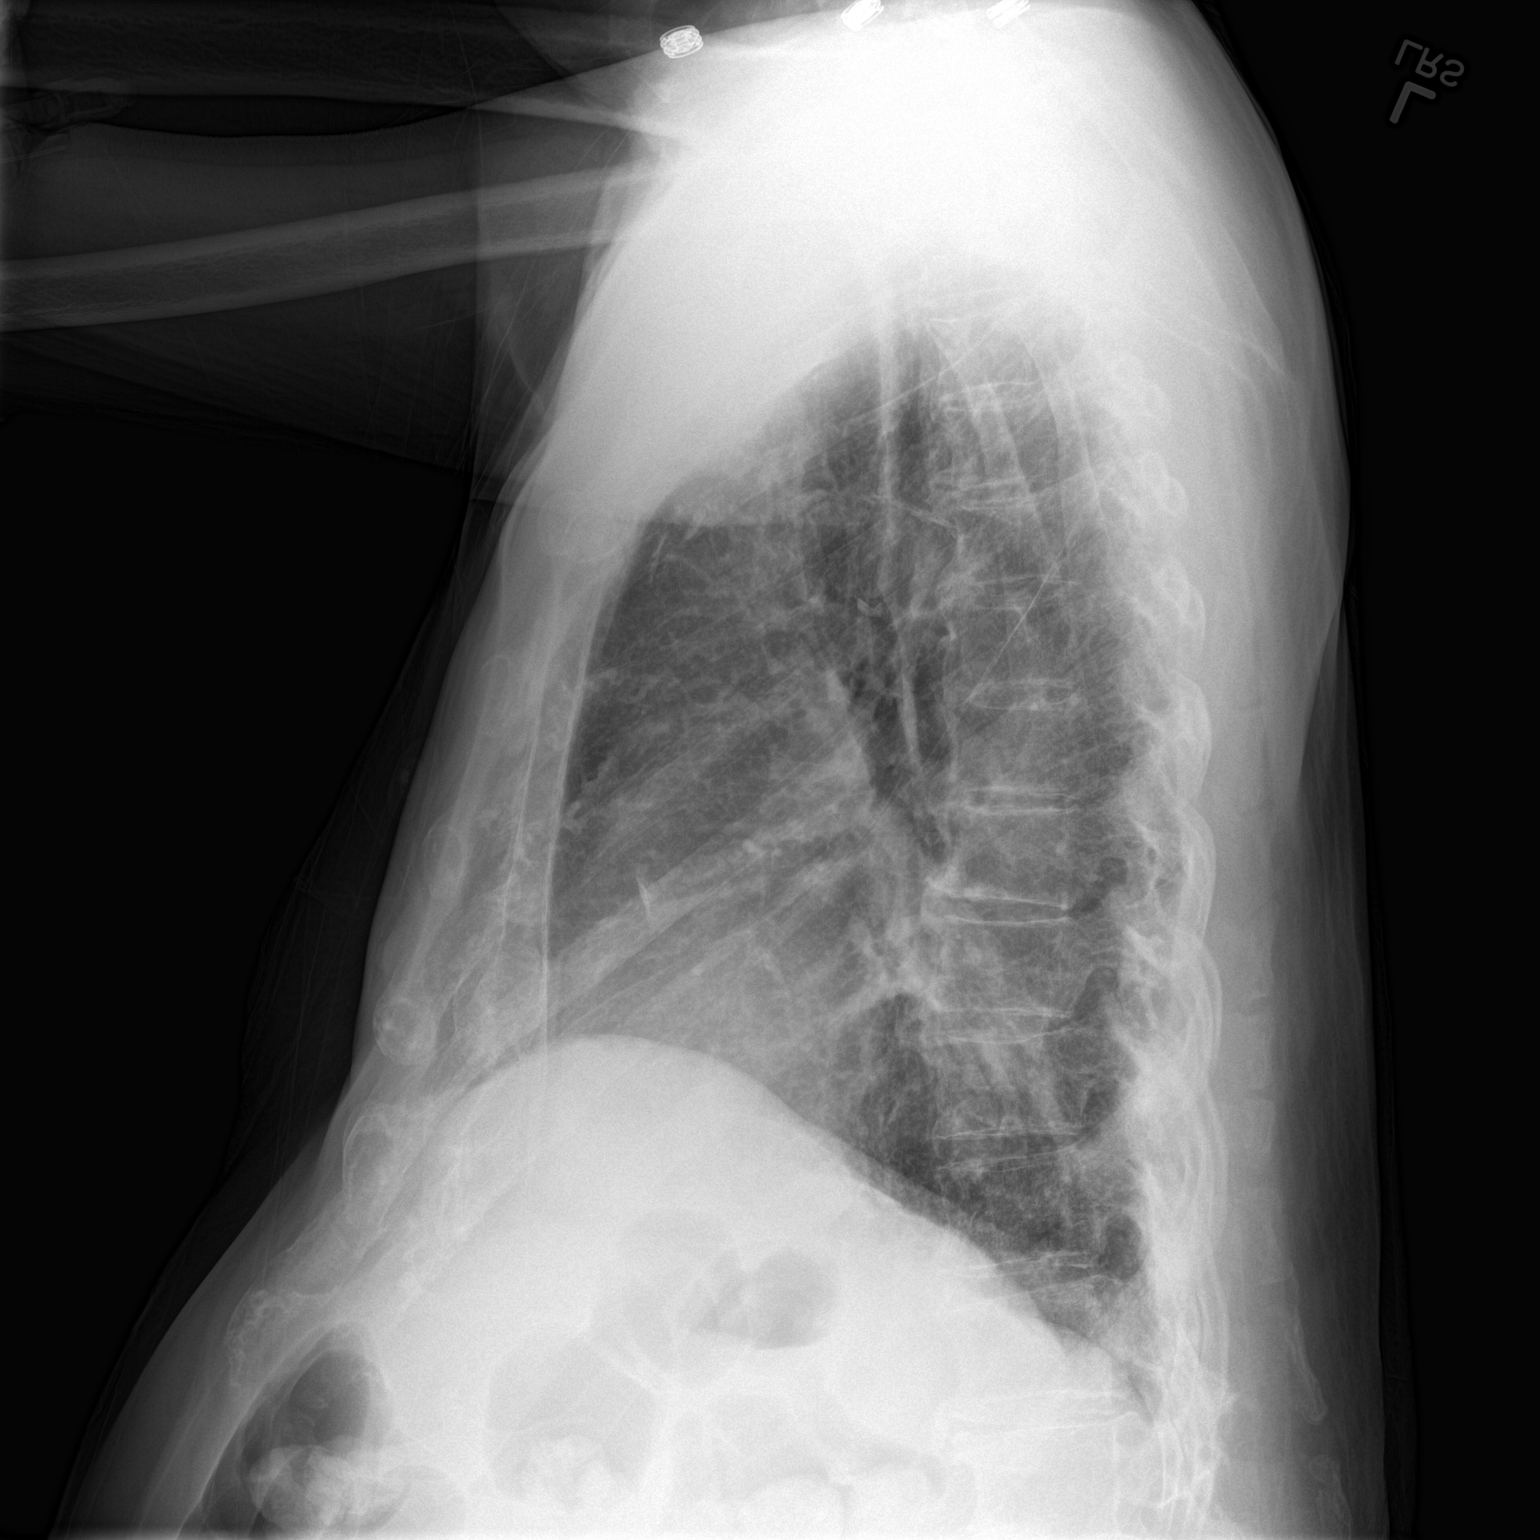

[2 of 2 positions shown; findings below may reference images not displayed]

FINDINGS: Mild enlargement of cardiac silhouette.

Calcified and tortuous thoracic aorta.

Mediastinal contours normal.

Emphysematous and bronchitic changes consistent with COPD.

Mild pectus deformity.

Nodular focus at LEFT lung base corresponds to calcified pleural
plaques on prior CT.

Minimal RIGHT basilar atelectasis.

No definite acute infiltrate, pleural effusion or pneumothorax.

Bones demineralized with scattered endplate spur formation thoracic
spine.
IMPRESSION: Mild enlargement of cardiac silhouette.

COPD changes with RIGHT basilar atelectasis and calcified pleural
plaques at LEFT base.

No definite acute abnormalities.

## 2015-03-30 ENCOUNTER — Encounter (INDEPENDENT_AMBULATORY_CARE_PROVIDER_SITE_OTHER): Payer: Medicare Other | Admitting: Ophthalmology

## 2015-03-30 DIAGNOSIS — H353231 Exudative age-related macular degeneration, bilateral, with active choroidal neovascularization: Secondary | ICD-10-CM | POA: Diagnosis not present

## 2015-03-30 DIAGNOSIS — H43813 Vitreous degeneration, bilateral: Secondary | ICD-10-CM

## 2015-04-19 ENCOUNTER — Encounter (INDEPENDENT_AMBULATORY_CARE_PROVIDER_SITE_OTHER): Payer: Medicare Other | Admitting: Ophthalmology

## 2015-04-19 DIAGNOSIS — H43813 Vitreous degeneration, bilateral: Secondary | ICD-10-CM | POA: Diagnosis not present

## 2015-04-19 DIAGNOSIS — H353231 Exudative age-related macular degeneration, bilateral, with active choroidal neovascularization: Secondary | ICD-10-CM

## 2015-05-17 ENCOUNTER — Encounter (INDEPENDENT_AMBULATORY_CARE_PROVIDER_SITE_OTHER): Payer: Medicare Other | Admitting: Ophthalmology

## 2015-05-17 DIAGNOSIS — H43813 Vitreous degeneration, bilateral: Secondary | ICD-10-CM | POA: Diagnosis not present

## 2015-05-17 DIAGNOSIS — H353231 Exudative age-related macular degeneration, bilateral, with active choroidal neovascularization: Secondary | ICD-10-CM | POA: Diagnosis not present

## 2015-06-15 ENCOUNTER — Encounter (INDEPENDENT_AMBULATORY_CARE_PROVIDER_SITE_OTHER): Payer: Medicare Other | Admitting: Ophthalmology

## 2015-06-15 DIAGNOSIS — H43813 Vitreous degeneration, bilateral: Secondary | ICD-10-CM

## 2015-06-15 DIAGNOSIS — H353231 Exudative age-related macular degeneration, bilateral, with active choroidal neovascularization: Secondary | ICD-10-CM

## 2015-07-13 ENCOUNTER — Encounter (INDEPENDENT_AMBULATORY_CARE_PROVIDER_SITE_OTHER): Payer: Medicare Other | Admitting: Ophthalmology

## 2015-07-13 DIAGNOSIS — H353231 Exudative age-related macular degeneration, bilateral, with active choroidal neovascularization: Secondary | ICD-10-CM

## 2015-07-13 DIAGNOSIS — H43813 Vitreous degeneration, bilateral: Secondary | ICD-10-CM | POA: Diagnosis not present

## 2015-08-10 ENCOUNTER — Encounter (INDEPENDENT_AMBULATORY_CARE_PROVIDER_SITE_OTHER): Payer: Medicare Other | Admitting: Ophthalmology

## 2015-08-10 DIAGNOSIS — H43813 Vitreous degeneration, bilateral: Secondary | ICD-10-CM | POA: Diagnosis not present

## 2015-08-10 DIAGNOSIS — H353231 Exudative age-related macular degeneration, bilateral, with active choroidal neovascularization: Secondary | ICD-10-CM

## 2015-09-07 ENCOUNTER — Encounter (INDEPENDENT_AMBULATORY_CARE_PROVIDER_SITE_OTHER): Payer: Medicare Other | Admitting: Ophthalmology

## 2015-09-07 DIAGNOSIS — H353231 Exudative age-related macular degeneration, bilateral, with active choroidal neovascularization: Secondary | ICD-10-CM

## 2015-09-07 DIAGNOSIS — H43813 Vitreous degeneration, bilateral: Secondary | ICD-10-CM

## 2015-10-12 ENCOUNTER — Encounter (INDEPENDENT_AMBULATORY_CARE_PROVIDER_SITE_OTHER): Payer: Medicare Other | Admitting: Ophthalmology

## 2015-10-12 DIAGNOSIS — H43813 Vitreous degeneration, bilateral: Secondary | ICD-10-CM

## 2015-10-12 DIAGNOSIS — H353231 Exudative age-related macular degeneration, bilateral, with active choroidal neovascularization: Secondary | ICD-10-CM | POA: Diagnosis not present

## 2015-10-24 ENCOUNTER — Ambulatory Visit (INDEPENDENT_AMBULATORY_CARE_PROVIDER_SITE_OTHER): Payer: Medicare Other | Admitting: Orthopaedic Surgery

## 2015-10-31 ENCOUNTER — Ambulatory Visit (INDEPENDENT_AMBULATORY_CARE_PROVIDER_SITE_OTHER): Payer: Medicare Other | Admitting: Orthopaedic Surgery

## 2015-10-31 DIAGNOSIS — M25552 Pain in left hip: Secondary | ICD-10-CM

## 2015-11-04 ENCOUNTER — Other Ambulatory Visit (INDEPENDENT_AMBULATORY_CARE_PROVIDER_SITE_OTHER): Payer: Self-pay | Admitting: Orthopaedic Surgery

## 2015-11-04 DIAGNOSIS — M545 Low back pain, unspecified: Secondary | ICD-10-CM

## 2015-11-14 ENCOUNTER — Ambulatory Visit (INDEPENDENT_AMBULATORY_CARE_PROVIDER_SITE_OTHER): Payer: Medicare Other | Admitting: Orthopaedic Surgery

## 2015-11-16 ENCOUNTER — Encounter (INDEPENDENT_AMBULATORY_CARE_PROVIDER_SITE_OTHER): Payer: Medicare Other | Admitting: Ophthalmology

## 2015-11-16 DIAGNOSIS — H353231 Exudative age-related macular degeneration, bilateral, with active choroidal neovascularization: Secondary | ICD-10-CM

## 2015-11-16 DIAGNOSIS — H43813 Vitreous degeneration, bilateral: Secondary | ICD-10-CM

## 2015-11-21 ENCOUNTER — Ambulatory Visit
Admission: RE | Admit: 2015-11-21 | Discharge: 2015-11-21 | Disposition: A | Discharge status: Home / Self Care | Payer: Medicare Other | Source: Ambulatory Visit | Attending: Orthopaedic Surgery | Admitting: Orthopaedic Surgery

## 2015-11-21 DIAGNOSIS — M545 Low back pain, unspecified: Secondary | ICD-10-CM

## 2015-12-05 ENCOUNTER — Ambulatory Visit (INDEPENDENT_AMBULATORY_CARE_PROVIDER_SITE_OTHER): Payer: Medicare Other | Admitting: Orthopaedic Surgery

## 2015-12-05 ENCOUNTER — Encounter (INDEPENDENT_AMBULATORY_CARE_PROVIDER_SITE_OTHER): Payer: Self-pay | Admitting: Orthopaedic Surgery

## 2015-12-05 DIAGNOSIS — G8929 Other chronic pain: Secondary | ICD-10-CM | POA: Diagnosis not present

## 2015-12-05 DIAGNOSIS — M545 Low back pain: Secondary | ICD-10-CM | POA: Diagnosis not present

## 2015-12-05 NOTE — Progress Notes (Signed)
Office Visit Note   Patient: Ricardo Allen           Date of Birth: 07-21-1925           MRN: [id number] Visit Date: 12/05/2015              Requested by: Velna Hatchet, MD [place], [place] [address] PCP: Velna Hatchet, MD   Assessment & Plan: Visit Diagnoses:  1. Chronic bilateral low back pain without sciatica     Plan: MRI independently reviewed and interpreted shows degenerative scoliosis with subacute T12 compression fracture. He has extensive facet disease. He has ankylosis of the L2-L3 vertebrae. At this point I'm going to send him to Dr. Ernestina Patches to see if an epidural steroid injection is appropriate for him. He is certainly not a great surgical candidate and I don't think he is interested in any sort of surgeries anyways. From my standpoint I will see him back as needed. He does not have a hip joint issue.  Follow-Up Instructions: Return if symptoms worsen or fail to improve.   Orders:  Orders Placed This Encounter  Procedures  . Ambulatory referral to Physical Medicine Rehab   No orders of the defined types were placed in this encounter.     Procedures: No procedures performed   Clinical Data: No additional findings.   Subjective: Chief Complaint  Patient presents with  . Lower Back - Pain  . Left Hip - Pain    Patient comes back today for MRI review. He continues to have left hip pain. The left SI joint injection did not help. He takes hydrocodone for pain    Review of Systems  Constitutional: Negative.   HENT: Negative.   Eyes: Negative.   Respiratory: Negative.   Cardiovascular: Negative.   Gastrointestinal: Negative.   Endocrine: Negative.   Genitourinary: Negative.   Musculoskeletal: Negative.   Skin: Negative.   Allergic/Immunologic: Negative.   Neurological: Negative.   Hematological: Negative.   Psychiatric/Behavioral: Negative.      Objective: Vital Signs: There were no vitals taken for this visit.  Physical  Exam  Ortho Exam Exam is stable Specialty Comments:  No specialty comments available.  Imaging: No results found.   PMFS History: Patient Active Problem List   Diagnosis Date Noted  . Chronic kidney disease 08/25/2013  . Intracranial hemorrhage ([place]) 04/04/2013  . Subdural hygroma 04/04/2013  . Restless leg syndrome 08/03/2012  . Diverticulosis of colon (without mention of hemorrhage) 06/21/2012  . Anemia 06/18/2012    Class: Acute  . GI bleed 06/18/2012    Class: Chronic  . Atrial fibrillation ([place]) 11/27/2009  . HYPOTENSION, ORTHOSTATIC 10/31/2009  . Other specified iron deficiency anemias 09/05/2009  . TRAUMATIC ARTHROPATHY PELVIC REGION AND THIGH 06/29/2009  . GOUT 03/22/2009  . NAUSEA 03/08/2009  . LUNG NODULE 03/29/2008  . WEIGHT LOSS, ABNORMAL 03/22/2008  . LOW BACK PAIN 02/03/2007  . LUMBAR RADICULOPATHY, LEFT 10/09/2006  . DM W/MANIFESTATION NEC, TYPE II, UNCONTROLLED 08/27/2006  . HYPERLIPIDEMIA 08/27/2006  . DEMENTIA 08/27/2006  . DEPRESSION 08/27/2006  . MELANOMA, MALIGNANT, TRUNK 08/19/2006  . Complex sleep apnea syndrome 08/19/2006   Past Medical History:  Diagnosis Date  . Anemia of other chronic disease 2008   EGD and colonoscopy in 2014 with diverticulosis only  . Atrial fibrillation ([place]) 11/27/2009  . 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 DEPRESSION 08/27/2006  . DISORDER, LUMBAR DISC W/MYELOPATHY 08/19/2006  . Diverticulosis   .  GOUT 03/22/2009  . HYPERLIPIDEMIA 08/27/2006  . HYPOTENSION, ORTHOSTATIC 10/31/2009  . Impaired fasting glucose 01/27/2007  . LOW BACK PAIN 02/03/2007   Lumbar radiculopathy 09/2006  . LUNG NODULE 03/29/2008  . MELANOMA, MALIGNANT, TRUNK 08/19/2006   early stage; just exicion.   . OBSTRUCTIVE SLEEP APNEA 08/19/2006  . TOBACCO ABUSE 12/31/2007  . TRAUMATIC ARTHROPATHY PELVIC REGION AND THIGH 06/29/2009  . Wet senile macular degeneration (HCC)     Family History  Problem Relation Age of Onset  . Pancreatic cancer  Mother   . Pancreatic cancer Mother   . Testicular cancer Father   . Arthritis Brother   . Cancer Maternal Aunt     head/neck cancer    Past Surgical History:  Procedure Laterality Date  . CATARACT EXTRACTION Bilateral   . COLONOSCOPY N/A 06/21/2012   Procedure: COLONOSCOPY;  Surgeon: Inda Castle, MD;  Location: [facility];  Service: Endoscopy;  Laterality: N/A;  . ESOPHAGOGASTRODUODENOSCOPY N/A 06/20/2012   Procedure: ESOPHAGOGASTRODUODENOSCOPY (EGD);  Surgeon: Inda Castle, MD;  Location: [facility];  Service: Endoscopy;  Laterality: N/A;  . LUMBAR FUSION    . LUMBAR LAMINECTOMY    . MELANOMA EXCISION     left lower back  . TONSILECTOMY, ADENOIDECTOMY, BILATERAL MYRINGOTOMY AND TUBES     Social History   Occupational History  .  Retired    Theme park manager   Social History Main Topics  . Smoking status: Former Smoker    Packs/day: 2.00    Years: 60.00    Types: Cigarettes    Quit date: 04/14/2008  . Smokeless tobacco: Never Used  . Alcohol use 4.4 oz/week    4 Glasses of wine, 4 Standard drinks or equivalent per week  . Drug use: No  . Sexual activity: No

## 2015-12-18 ENCOUNTER — Encounter (INDEPENDENT_AMBULATORY_CARE_PROVIDER_SITE_OTHER): Payer: Self-pay | Admitting: Physical Medicine and Rehabilitation

## 2015-12-18 ENCOUNTER — Ambulatory Visit (INDEPENDENT_AMBULATORY_CARE_PROVIDER_SITE_OTHER): Payer: Medicare Other | Admitting: Physical Medicine and Rehabilitation

## 2015-12-18 VITALS — BP 175/106 | HR 72

## 2015-12-18 DIAGNOSIS — G8929 Other chronic pain: Secondary | ICD-10-CM | POA: Diagnosis not present

## 2015-12-18 DIAGNOSIS — M545 Low back pain, unspecified: Secondary | ICD-10-CM

## 2015-12-18 DIAGNOSIS — M47816 Spondylosis without myelopathy or radiculopathy, lumbar region: Secondary | ICD-10-CM

## 2015-12-18 DIAGNOSIS — M25552 Pain in left hip: Secondary | ICD-10-CM | POA: Diagnosis not present

## 2015-12-18 MED ORDER — METHYLPREDNISOLONE ACETATE 80 MG/ML IJ SUSP
80.0000 mg | Freq: Once | INTRAMUSCULAR | Status: AC
  Administered 2015-12-18: 80 mg

## 2015-12-18 MED ORDER — LIDOCAINE HCL (PF) 1 % IJ SOLN: 0.3300 mL | Freq: Once | INTRAMUSCULAR | Status: DC

## 2015-12-18 NOTE — Patient Instructions (Signed)

## 2015-12-18 NOTE — Progress Notes (Signed)
Ricardo Allen - 82 y.o. male MRN [id number]  Date of birth: 06-25-1925  Office Visit Note: Visit Date: 12/18/2015 PCP: Velna Hatchet, MD Referred by: Velna Hatchet, MD  Subjective: Chief Complaint  Patient presents with  . Lower Back - Pain  . Left Hip - Pain   HPI: Mr. Ricardo Allen is an 82 year old gentleman with multiple medical problems including a history of pretty significant chronic pain. He was seeing Dr. Hardin Negus in town was on morphine for 8 years but has weaned himself off morphine but has been recently taking hydrocodone only in the morning. He has a history of malignant melanoma lung nodules dementia as well as type 2 diabetes. He comes in today with left low back and buttock pain that he refers to his hip pain. He denies any groin pain. He has seen Dr. Erlinda Hong who has evaluated him and there is an MRI showing L2-3 and L3-4 rightward curvature but without central stenosis or disc herniation. MRI is reviewed below and was discussed with the patient. The patient has had prior lumbar fusion. He denies any symptoms down the leg or paresthesias. Sometimes at night he gets some pain or numbness around the ankles bilaterally. He states that his symptoms in the mornings are the worst. In the first thing in the morning he is very stiff and painful and then as the day goes on he does get somewhat better. He evidently did have an sacroiliac joint injection performed it was not very successful. This was not done in our office. He is not had recent physical therapy. He rates his morning pain as a 9 out of 10.    Review of Systems  Constitutional: Positive for malaise/fatigue and weight loss. Negative for chills and fever.  HENT: Negative for hearing loss and sinus pain.   Eyes: Negative for blurred vision, double vision and photophobia.  Respiratory: Negative for cough and shortness of breath.   Cardiovascular: Negative for chest pain, palpitations and leg swelling.  Gastrointestinal: Negative for  abdominal pain, nausea and vomiting.  Genitourinary: Negative for flank pain.  Musculoskeletal: Positive for back pain, falls and joint pain. Negative for myalgias.  Skin: Negative for itching and rash.  Neurological: Positive for weakness. Negative for tremors and focal weakness.  Endo/Heme/Allergies: Negative.   Psychiatric/Behavioral: Negative for depression.  All other systems reviewed and are negative.  Otherwise per HPI.  Assessment & Plan: Visit Diagnoses:  1. Spondylosis without myelopathy or radiculopathy, lumbar region   2. Chronic bilateral low back pain without sciatica   3. Pain in left hip     Plan: Findings:  Failed back syndrome and chronic pain syndrome with some collapsing right-sided scoliosis at L3-4 with MRI evidence of facet arthropathy prior fusion but with really decent decompression of the central canal without any new disc herniations. The left low back and buttock pain is consistent somewhat with facet mediated pain clinically. The buttock pain could be a referral pattern from nerve root irritation. I had a long talk with the patient and his sister. This is almost a reevaluation of his condition because there confused like most patients with the fact that MRIs are not necessarily going to tell you what hurts there just a picture mainly to rule out things and to give an idea what's going on which is the case in this patient. They also were very concerned about the cost of injections and I did suggest to him that they could talk to the insurance company always at a  time to see that it was really unable for me to tell them exactly what their out-of-pocket expense was going to be and this is the way medicine and insurance works and I wish that were more simplified. I did go ahead today and inject the left L5-S1 facet joint. Depending on his relief would look at an epidural injection below his fusion. I spent more than 15 minutes speaking face-to-face with the patient with 50%  of the time in counseling.    Meds & Orders:  Meds ordered this encounter  Medications  . lidocaine (PF) (XYLOCAINE) 1 % injection 0.3 mL  . methylPREDNISolone acetate (DEPO-MEDROL) injection 80 mg    Orders Placed This Encounter  Procedures  . Nerve Block    Follow-up: Return for Recheck hip with Dr. Erlinda Hong in 2 weeks if no help.   Procedures: No procedures performed  Lumbar Facet Joint Intra-Articular Injection(s) with Fluoroscopic Guidance  Patient: Ricardo Allen      Date of Birth: 03-Aug-1925 MRN: [id number] PCP: Velna Hatchet, MD      Visit Date: 12/18/2015   Universal Protocol:    Date/Time: 12/05/175:28 AM  Consent Given By: the patient  Position: PRONE   Additional Comments: Vital signs were monitored before and after the procedure. Patient was prepped and draped in the usual sterile fashion. The correct patient, procedure, and site was verified.   Injection Procedure Details:  Procedure Site One Meds Administered:  Meds ordered this encounter  Medications  . lidocaine (PF) (XYLOCAINE) 1 % injection 0.3 mL  . methylPREDNISolone acetate (DEPO-MEDROL) injection 80 mg     Laterality: Left  Location/Site:  L5-S1  Needle size: 22 guage  Needle type: Spinal  Needle Placement: Articular  Findings:  -Contrast Used: 1 mL iohexol 180 mg iodine/mL   -Comments: Excellent flow of contrast producing a partial arthrogram.  Procedure Details: The fluoroscope beam is vertically oriented in AP, and the inferior recess is visualized beneath the lower pole of the inferior apophyseal process, which represents the target point for needle insertion. When direct visualization is difficult the target point is located at the medial projection of the vertebral pedicle. The region overlying each aforementioned target is locally anesthetized with a 1 to 2 ml. volume of 1% Lidocaine without Epinephrine.   The spinal needle was inserted into each of the above mentioned facet  joints using biplanar fluoroscopic guidance. A 0.25 to 0.5 ml. volume of Isovue-250 was injected and a partial facet joint arthrogram was obtained. A single spot film was obtained of the resulting arthrogram.    One to 1.25 ml of the steroid/anesthetic solution was then injected into each of the facet joints noted above.   Additional Comments:  The patient tolerated the procedure well Dressing: Band-Aid    Post-procedure details: Patient was observed during the procedure. Post-procedure instructions were reviewed.  Patient left the clinic in stable condition.       Clinical History: Lspine MRI 11/21/2015  Segmentation: Lowest fully formed disc space is designated L5-S1 as on the prior MRI. There is a right-sided assimilation joint between L5 and the sacrum.  Alignment: Moderate lumbar dextroscoliosis with apex at L2-3. Unchanged grade 1 retrolisthesis of L3 on L4 and grade 1 anterolisthesis of L4 on L5.  Vertebrae: A new T12 superior plate compression fracture demonstrates 30% height loss with mild-to-moderate marrow edema and 2 mm retropulsion. Mild left-sided vertebral body height loss at L2 is chronic in appearance and likely related to the underlying scoliosis. Multilevel type  2 degenerative endplate changes are present. Interval ankylosis across the L2-3 disc space.  T11-12: New rightward disc bulging, slight T12 superior endplate retropulsion, ligamentum flavum thickening, and mild facet arthrosis result in mild right neural foraminal narrowing and borderline spinal stenosis. A 3 mm cyst is noted just medial to the right facet joint within the ligament.  T12-L1: Disc bulging asymmetric to the right and mild facet arthrosis result in borderline spinal stenosis and minimal right neural foraminal narrowing, unchanged.  L1-2: Circumferential disc bulging and moderate facet hypertrophy result in mild left lateral recess and mild-to-moderate spinal stenosis without  significant neural foraminal stenosis, unchanged. Disc may affect the right L1 nerve lateral to the foramen. Small facet joint effusions.  L2-3: Prior posterior decompression and fusion. Rightward disc bulging, diffuse endplate osteophyte formation, and posterior element hypertrophy results in mild residual spinal stenosis and left lateral recess stenosis, unchanged.  L3-4: Prior posterior decompression. Circumferential disc osteophyte complex results in mild residual spinal stenosis and moderate left neural foraminal stenosis, unchanged.  L4-5: Listhesis with bulging uncovered disc and severe right and moderate left facet arthrosis result in mild right greater than left neural foraminal stenosis without spinal stenosis, unchanged.  L5-S1: Right greater than left facet arthrosis and minimal disc bulging without stenosis, unchanged.  He reports that he quit smoking about 7 years ago. His smoking use included Cigarettes. He has a 120.00 pack-year smoking history. He has never used smokeless tobacco. No results for input(s): HGBA1C, LABURIC in the last 8760 hours.  Objective:  VS:  HT:    WT:   BMI:     BP:(!) 175/106  HR:72bpm  TEMP: ( )  RESP:100 % Physical Exam  Constitutional: He is oriented to person, place, and time. No distress.  Thin and frail-appearing  HENT:  Head: Normocephalic and atraumatic.  Mouth/Throat: Oropharynx is clear and moist.  Eyes: Conjunctivae are normal. Pupils are equal, round, and reactive to light.  Cardiovascular: Normal rate and intact distal pulses.   Pulmonary/Chest: Effort normal and breath sounds normal.  Musculoskeletal:  The patient is somewhat slow to rise from a seated position. He does have concordant pain with extension rotation to the left. Has no pain over the greater trochanter. He has good distal strength.  Neurological: He is alert and oriented to person, place, and time.  Skin: Skin is warm.  Psychiatric: He has a normal mood  and affect.    Ortho Exam Imaging: No results found.  Past Medical/Family/Surgical/Social History: Medications & Allergies reviewed per EMR Patient Active Problem List   Diagnosis Date Noted  . Chronic kidney disease 08/25/2013  . Intracranial hemorrhage ([place]) 04/04/2013  . Subdural hygroma 04/04/2013  . Restless leg syndrome 08/03/2012  . Diverticulosis of colon (without mention of hemorrhage) 06/21/2012  . Anemia 06/18/2012    Class: Acute  . GI bleed 06/18/2012    Class: Chronic  . Atrial fibrillation ([place]) 11/27/2009  . HYPOTENSION, ORTHOSTATIC 10/31/2009  . Other specified iron deficiency anemias 09/05/2009  . TRAUMATIC ARTHROPATHY PELVIC REGION AND THIGH 06/29/2009  . GOUT 03/22/2009  . NAUSEA 03/08/2009  . LUNG NODULE 03/29/2008  . WEIGHT LOSS, ABNORMAL 03/22/2008  . LOW BACK PAIN 02/03/2007  . LUMBAR RADICULOPATHY, LEFT 10/09/2006  . DM W/MANIFESTATION NEC, TYPE II, UNCONTROLLED 08/27/2006  . HYPERLIPIDEMIA 08/27/2006  . DEMENTIA 08/27/2006  . DEPRESSION 08/27/2006  . MELANOMA, MALIGNANT, TRUNK 08/19/2006  . Complex sleep apnea syndrome 08/19/2006   Past Medical History:  Diagnosis Date  . Anemia of other chronic  disease 2008   EGD and colonoscopy in 2014 with diverticulosis only  . Atrial fibrillation ([place]) 11/27/2009  . 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 DEPRESSION 08/27/2006  . DISORDER, LUMBAR DISC W/MYELOPATHY 08/19/2006  . Diverticulosis   . GOUT 03/22/2009  . HYPERLIPIDEMIA 08/27/2006  . HYPOTENSION, ORTHOSTATIC 10/31/2009  . Impaired fasting glucose 01/27/2007  . LOW BACK PAIN 02/03/2007   Lumbar radiculopathy 09/2006  . LUNG NODULE 03/29/2008  . MELANOMA, MALIGNANT, TRUNK 08/19/2006   early stage; just exicion.   . OBSTRUCTIVE SLEEP APNEA 08/19/2006  . TOBACCO ABUSE 12/31/2007  . TRAUMATIC ARTHROPATHY PELVIC REGION AND THIGH 06/29/2009  . Wet senile macular degeneration (H[facility])    Family History  Problem Relation Age of Onset  . Pancreatic  cancer Mother   . Pancreatic cancer Mother   . Testicular cancer Father   . Arthritis Brother   . Cancer Maternal Aunt     head/neck cancer   Past Surgical History:  Procedure Laterality Date  . CATARACT EXTRACTION Bilateral   . COLONOSCOPY N/A 06/21/2012   Procedure: COLONOSCOPY;  Surgeon: Inda Castle, MD;  Location: [facility];  Service: Endoscopy;  Laterality: N/A;  . ESOPHAGOGASTRODUODENOSCOPY N/A 06/20/2012   Procedure: ESOPHAGOGASTRODUODENOSCOPY (EGD);  Surgeon: Inda Castle, MD;  Location: [facility];  Service: Endoscopy;  Laterality: N/A;  . LUMBAR FUSION    . LUMBAR LAMINECTOMY    . MELANOMA EXCISION     left lower back  . TONSILECTOMY, ADENOIDECTOMY, BILATERAL MYRINGOTOMY AND TUBES     Social History   Occupational History  .  Retired    Theme park manager   Social History Main Topics  . Smoking status: Former Smoker    Packs/day: 2.00    Years: 60.00    Types: Cigarettes    Quit date: 04/14/2008  . Smokeless tobacco: Never Used  . Alcohol use 4.4 oz/week    4 Glasses of wine, 4 Standard drinks or equivalent per week  . Drug use: No  . Sexual activity: No

## 2015-12-19 NOTE — Procedures (Signed)
Lumbar Facet Joint Intra-Articular Injection(s) with Fluoroscopic Guidance  Patient: Ricardo Allen      Date of Birth: Jan 01, 1926 MRN: [id number] PCP: Velna Hatchet, MD      Visit Date: 12/18/2015   Universal Protocol:    Date/Time: 12/05/175:28 AM  Consent Given By: the patient  Position: PRONE   Additional Comments: Vital signs were monitored before and after the procedure. Patient was prepped and draped in the usual sterile fashion. The correct patient, procedure, and site was verified.   Injection Procedure Details:  Procedure Site One Meds Administered:  Meds ordered this encounter  Medications  . lidocaine (PF) (XYLOCAINE) 1 % injection 0.3 mL  . methylPREDNISolone acetate (DEPO-MEDROL) injection 80 mg     Laterality: Left  Location/Site:  L5-S1  Needle size: 22 guage  Needle type: Spinal  Needle Placement: Articular  Findings:  -Contrast Used: 1 mL iohexol 180 mg iodine/mL   -Comments: Excellent flow of contrast producing a partial arthrogram.  Procedure Details: The fluoroscope beam is vertically oriented in AP, and the inferior recess is visualized beneath the lower pole of the inferior apophyseal process, which represents the target point for needle insertion. When direct visualization is difficult the target point is located at the medial projection of the vertebral pedicle. The region overlying each aforementioned target is locally anesthetized with a 1 to 2 ml. volume of 1% Lidocaine without Epinephrine.   The spinal needle was inserted into each of the above mentioned facet joints using biplanar fluoroscopic guidance. A 0.25 to 0.5 ml. volume of Isovue-250 was injected and a partial facet joint arthrogram was obtained. A single spot film was obtained of the resulting arthrogram.    One to 1.25 ml of the steroid/anesthetic solution was then injected into each of the facet joints noted above.   Additional Comments:  The patient tolerated the procedure  well Dressing: Band-Aid    Post-procedure details: Patient was observed during the procedure. Post-procedure instructions were reviewed.  Patient left the clinic in stable condition.

## 2015-12-21 ENCOUNTER — Encounter (INDEPENDENT_AMBULATORY_CARE_PROVIDER_SITE_OTHER): Payer: Medicare Other | Admitting: Ophthalmology

## 2015-12-21 DIAGNOSIS — H43813 Vitreous degeneration, bilateral: Secondary | ICD-10-CM

## 2015-12-21 DIAGNOSIS — H353231 Exudative age-related macular degeneration, bilateral, with active choroidal neovascularization: Secondary | ICD-10-CM

## 2016-01-12 ENCOUNTER — Telehealth (INDEPENDENT_AMBULATORY_CARE_PROVIDER_SITE_OTHER): Payer: Self-pay | Admitting: Physical Medicine and Rehabilitation

## 2016-01-16 NOTE — Telephone Encounter (Signed)
Called patient's wife and left message for her to call back to schedule.

## 2016-01-16 NOTE — Telephone Encounter (Signed)
Patient called back and left a message. I called and left another message for him to call back to schedule.

## 2016-01-16 NOTE — Telephone Encounter (Signed)
Left L5 and S1 TF esi

## 2016-01-25 ENCOUNTER — Encounter (INDEPENDENT_AMBULATORY_CARE_PROVIDER_SITE_OTHER): Payer: Medicare Other | Admitting: Ophthalmology

## 2016-01-25 DIAGNOSIS — H353231 Exudative age-related macular degeneration, bilateral, with active choroidal neovascularization: Secondary | ICD-10-CM | POA: Diagnosis not present

## 2016-01-25 DIAGNOSIS — H43813 Vitreous degeneration, bilateral: Secondary | ICD-10-CM | POA: Diagnosis not present

## 2016-01-29 NOTE — Telephone Encounter (Signed)
No precert required 

## 2016-01-31 ENCOUNTER — Encounter (INDEPENDENT_AMBULATORY_CARE_PROVIDER_SITE_OTHER): Payer: Medicare Other | Admitting: Physical Medicine and Rehabilitation

## 2016-02-06 ENCOUNTER — Ambulatory Visit (INDEPENDENT_AMBULATORY_CARE_PROVIDER_SITE_OTHER): Payer: Medicare Other | Admitting: Orthopaedic Surgery

## 2016-02-08 ENCOUNTER — Encounter (INDEPENDENT_AMBULATORY_CARE_PROVIDER_SITE_OTHER): Payer: Self-pay | Admitting: Physical Medicine and Rehabilitation

## 2016-02-08 ENCOUNTER — Ambulatory Visit (INDEPENDENT_AMBULATORY_CARE_PROVIDER_SITE_OTHER): Payer: Self-pay

## 2016-02-08 ENCOUNTER — Ambulatory Visit (INDEPENDENT_AMBULATORY_CARE_PROVIDER_SITE_OTHER): Payer: Medicare Other | Admitting: Physical Medicine and Rehabilitation

## 2016-02-08 VITALS — BP 145/63 | HR 80

## 2016-02-08 DIAGNOSIS — M5416 Radiculopathy, lumbar region: Secondary | ICD-10-CM | POA: Diagnosis not present

## 2016-02-08 DIAGNOSIS — G894 Chronic pain syndrome: Secondary | ICD-10-CM

## 2016-02-08 DIAGNOSIS — M961 Postlaminectomy syndrome, not elsewhere classified: Secondary | ICD-10-CM

## 2016-02-08 MED ORDER — METHYLPREDNISOLONE ACETATE 80 MG/ML IJ SUSP
80.0000 mg | Freq: Once | INTRAMUSCULAR | Status: AC
  Administered 2016-02-08: 80 mg

## 2016-02-08 MED ORDER — LIDOCAINE HCL (PF) 1 % IJ SOLN
0.3300 mL | Freq: Once | INTRAMUSCULAR | Status: AC
  Administered 2016-02-08: 0.3 mL

## 2016-02-08 NOTE — Patient Instructions (Signed)

## 2016-02-08 NOTE — Progress Notes (Signed)
Ricardo Allen - 83 y.o. male MRN [id number]  Date of birth: February 25, 1925  Office Visit Note: Visit Date: 02/08/2016 PCP: Velna Hatchet, MD Referred by: Velna Hatchet, MD  Subjective: Chief Complaint  Patient presents with  . Lower Back - Pain   HPI: Ricardo Allen is an 83 year old complicated chronic pain patient with prior history of lumbar surgery and fusion with persistent recalcitrant left buttock pain. Left side lower back pain radiating up to shoulder blade and left hip and buttock pain. Worse first thing in the morning and in the evening. Denies pain down leg. Denies groin pain. Leg cramps at times. Prior facet joint block was not beneficial at all. He states that what really helps is morphine    ROS Otherwise per HPI.  Assessment & Plan: Visit Diagnoses: No diagnosis found.  Plan: Findings:  Left L5 and S1 transforaminal epidural steroid injection. If this is not beneficial I have very little to offer. He would probably do better with comprehensive medication management and psychological support.    Meds & Orders: No orders of the defined types were placed in this encounter.  No orders of the defined types were placed in this encounter.   Follow-up: No Follow-up on file.   Procedures: No procedures performed  No notes on file   Clinical History: Lspine MRI 11/21/2015  Segmentation: Lowest fully formed disc space is designated L5-S1 as on the prior MRI. There is a right-sided assimilation joint between L5 and the sacrum.  Alignment: Moderate lumbar dextroscoliosis with apex at L2-3. Unchanged grade 1 retrolisthesis of L3 on L4 and grade 1 anterolisthesis of L4 on L5.  Vertebrae: A new T12 superior plate compression fracture demonstrates 30% height loss with mild-to-moderate marrow edema and 2 mm retropulsion. Mild left-sided vertebral body height loss at L2 is chronic in appearance and likely related to the underlying scoliosis. Multilevel type 2 degenerative  endplate changes are present. Interval ankylosis across the L2-3 disc space.  T11-12: New rightward disc bulging, slight T12 superior endplate retropulsion, ligamentum flavum thickening, and mild facet arthrosis result in mild right neural foraminal narrowing and borderline spinal stenosis. A 3 mm cyst is noted just medial to the right facet joint within the ligament.  T12-L1: Disc bulging asymmetric to the right and mild facet arthrosis result in borderline spinal stenosis and minimal right neural foraminal narrowing, unchanged.  L1-2: Circumferential disc bulging and moderate facet hypertrophy result in mild left lateral recess and mild-to-moderate spinal stenosis without significant neural foraminal stenosis, unchanged. Disc may affect the right L1 nerve lateral to the foramen. Small facet joint effusions.  L2-3: Prior posterior decompression and fusion. Rightward disc bulging, diffuse endplate osteophyte formation, and posterior element hypertrophy results in mild residual spinal stenosis and left lateral recess stenosis, unchanged.  L3-4: Prior posterior decompression. Circumferential disc osteophyte complex results in mild residual spinal stenosis and moderate left neural foraminal stenosis, unchanged.  L4-5: Listhesis with bulging uncovered disc and severe right and moderate left facet arthrosis result in mild right greater than left neural foraminal stenosis without spinal stenosis, unchanged.  L5-S1: Right greater than left facet arthrosis and minimal disc bulging without stenosis, unchanged.  He reports that he quit smoking about 7 years ago. His smoking use included Cigarettes. He has a 120.00 pack-year smoking history. He has never used smokeless tobacco. No results for input(s): HGBA1C, LABURIC in the last 8760 hours.  Objective:  VS:  HT:    WT:   BMI:     BP:  HR: bpm  TEMP: ( )  RESP:  Physical Exam  Musculoskeletal:  He has no pain with hip  rotation and no pain over the greater trochanters. He has good distal strength.    Ortho Exam Imaging: No results found.  Past Medical/Family/Surgical/Social History: Medications & Allergies reviewed per EMR Patient Active Problem List   Diagnosis Date Noted  . Chronic kidney disease 08/25/2013  . Intracranial hemorrhage ([place]) 04/04/2013  . Subdural hygroma 04/04/2013  . Restless leg syndrome 08/03/2012  . Diverticulosis of colon (without mention of hemorrhage) 06/21/2012  . Anemia 06/18/2012    Class: Acute  . GI bleed 06/18/2012    Class: Chronic  . Atrial fibrillation ([place]) 11/27/2009  . HYPOTENSION, ORTHOSTATIC 10/31/2009  . Other specified iron deficiency anemias 09/05/2009  . TRAUMATIC ARTHROPATHY PELVIC REGION AND THIGH 06/29/2009  . GOUT 03/22/2009  . NAUSEA 03/08/2009  . LUNG NODULE 03/29/2008  . WEIGHT LOSS, ABNORMAL 03/22/2008  . LOW BACK PAIN 02/03/2007  . LUMBAR RADICULOPATHY, LEFT 10/09/2006  . DM W/MANIFESTATION NEC, TYPE II, UNCONTROLLED 08/27/2006  . HYPERLIPIDEMIA 08/27/2006  . DEMENTIA 08/27/2006  . DEPRESSION 08/27/2006  . MELANOMA, MALIGNANT, TRUNK 08/19/2006  . Complex sleep apnea syndrome 08/19/2006   Past Medical History:  Diagnosis Date  . Anemia of other chronic disease 2008   EGD and colonoscopy in 2014 with diverticulosis only  . Atrial fibrillation ([place]) 11/27/2009  . 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 DEPRESSION 08/27/2006  . DISORDER, LUMBAR DISC W/MYELOPATHY 08/19/2006  . Diverticulosis   . GOUT 03/22/2009  . HYPERLIPIDEMIA 08/27/2006  . HYPOTENSION, ORTHOSTATIC 10/31/2009  . Impaired fasting glucose 01/27/2007  . LOW BACK PAIN 02/03/2007   Lumbar radiculopathy 09/2006  . LUNG NODULE 03/29/2008  . MELANOMA, MALIGNANT, TRUNK 08/19/2006   early stage; just exicion.   . OBSTRUCTIVE SLEEP APNEA 08/19/2006  . TOBACCO ABUSE 12/31/2007  . TRAUMATIC ARTHROPATHY PELVIC REGION AND THIGH 06/29/2009  . Wet senile macular degeneration (HCC)     Family History  Problem Relation Age of Onset  . Pancreatic cancer Mother   . Pancreatic cancer Mother   . Testicular cancer Father   . Arthritis Brother   . Cancer Maternal Aunt     head/neck cancer   Past Surgical History:  Procedure Laterality Date  . CATARACT EXTRACTION Bilateral   . COLONOSCOPY N/A 06/21/2012   Procedure: COLONOSCOPY;  Surgeon: Inda Castle, MD;  Location: [facility];  Service: Endoscopy;  Laterality: N/A;  . ESOPHAGOGASTRODUODENOSCOPY N/A 06/20/2012   Procedure: ESOPHAGOGASTRODUODENOSCOPY (EGD);  Surgeon: Inda Castle, MD;  Location: [facility];  Service: Endoscopy;  Laterality: N/A;  . LUMBAR FUSION    . LUMBAR LAMINECTOMY    . MELANOMA EXCISION     left lower back  . TONSILECTOMY, ADENOIDECTOMY, BILATERAL MYRINGOTOMY AND TUBES     Social History   Occupational History  .  Retired    Theme park manager   Social History Main Topics  . Smoking status: Former Smoker    Packs/day: 2.00    Years: 60.00    Types: Cigarettes    Quit date: 04/14/2008  . Smokeless tobacco: Never Used  . Alcohol use 4.4 oz/week    4 Glasses of wine, 4 Standard drinks or equivalent per week  . Drug use: No  . Sexual activity: No

## 2016-02-08 NOTE — Procedures (Signed)
Lumbosacral Transforaminal Epidural Steroid Injection - Infraneural Approach with Fluoroscopic Guidance  Patient: Ricardo Allen      Date of Birth: December 16, 1925 MRN: [id number] PCP: Velna Hatchet, MD      Visit Date: 02/08/2016   Universal Protocol:    Date/Time: 01/25/182:04 PM  Consent Given By: the patient  Position: PRONE   Additional Comments: Vital signs were monitored before and after the procedure. Patient was prepped and draped in the usual sterile fashion. The correct patient, procedure, and site was verified.   Injection Procedure Details:  Procedure Site One Meds Administered:  Meds ordered this encounter  Medications  . lidocaine (PF) (XYLOCAINE) 1 % injection 0.3 mL  . methylPREDNISolone acetate (DEPO-MEDROL) injection 80 mg      Laterality: Left  Location/Site:  L5-S1 S1-2  Needle size: 22 G  Needle type: Spinal  Needle Placement: Transforaminal  Findings:  -Contrast Used: 2 mL iohexol 180 mg iodine/mL   -Comments: Excellent flow of contrast along the nerve and into the epidural space.  Procedure Details: After squaring off the end-plates of the desired vertebral level to get a true AP view, the C-arm was obliqued to the painful side so that the superior articulating process is positioned about 1/3 the length of the inferior endplate.  The needle was aimed toward the junction of the superior articular process and the transverse process of the inferior vertebrae. The needle's initial entry is in the lower third of the foramen through Kambin's triangle. The soft tissues overlying this target were infiltrated with 2-3 ml. of 1% Lidocaine without Epinephrine.  The spinal needle was then inserted and advanced toward the target using a "trajectory" view along the fluoroscope beam.  Under AP and lateral visualization, the needle was advanced so it did not puncture dura and did not traverse medially beyond the 6 o'clock position of the pedicle. Bi-planar  projections were used to confirm position. Aspiration was confirmed to be negative for CSF and/or blood. A 1-2 ml. volume of Isovue-250 was injected and flow of contrast was noted at each level. Radiographs were obtained for documentation purposes.   After attaining the desired flow of contrast documented above, a 0.5 to 1.0 ml test dose of 0.25% Marcaine was injected into each respective transforaminal space.  The patient was observed for 90 seconds post injection.  After no sensory deficits were reported, and normal lower extremity motor function was noted,   the above injectate was administered so that equal amounts of the injectate were placed at each foramen (level) into the transforaminal epidural space.   Additional Comments:  The patient tolerated the procedure well Dressing: Band-Aid    Post-procedure details: Patient was observed during the procedure. Post-procedure instructions were reviewed.  Patient left the clinic in stable condition.

## 2016-02-16 ENCOUNTER — Telehealth (INDEPENDENT_AMBULATORY_CARE_PROVIDER_SITE_OTHER): Payer: Self-pay | Admitting: Physical Medicine and Rehabilitation

## 2016-02-16 NOTE — Telephone Encounter (Signed)
Ricardo's wife called and stated that the shots that Dr. Ernestina Patches gave to Ricardo Allen are not working.  Cb#[phone].  Thank you

## 2016-02-19 NOTE — Telephone Encounter (Signed)
At this point he probably should either see Dr. Sherrian Divers again to see if this is more of a hip or pelvis problem now that the epidural injections just not helps. Alternatively they may want evaluation by Dr. Louanne Skye or other spine surgeon. The patient may be better off at comprehensive pain management

## 2016-02-20 NOTE — Telephone Encounter (Signed)
Spoke with wife and scheduled pt for 03/06/15 @ 9:30 to see Dr. Erlinda Hong

## 2016-02-29 ENCOUNTER — Encounter (INDEPENDENT_AMBULATORY_CARE_PROVIDER_SITE_OTHER): Payer: Medicare Other | Admitting: Ophthalmology

## 2016-02-29 DIAGNOSIS — H43813 Vitreous degeneration, bilateral: Secondary | ICD-10-CM | POA: Diagnosis not present

## 2016-02-29 DIAGNOSIS — H353231 Exudative age-related macular degeneration, bilateral, with active choroidal neovascularization: Secondary | ICD-10-CM

## 2016-03-05 ENCOUNTER — Ambulatory Visit (INDEPENDENT_AMBULATORY_CARE_PROVIDER_SITE_OTHER): Payer: Medicare Other | Admitting: Orthopaedic Surgery

## 2016-03-05 ENCOUNTER — Encounter (INDEPENDENT_AMBULATORY_CARE_PROVIDER_SITE_OTHER): Payer: Self-pay | Admitting: Orthopaedic Surgery

## 2016-03-05 DIAGNOSIS — G8929 Other chronic pain: Secondary | ICD-10-CM | POA: Diagnosis not present

## 2016-03-05 DIAGNOSIS — M545 Low back pain, unspecified: Secondary | ICD-10-CM

## 2016-03-05 NOTE — Progress Notes (Signed)
Office Visit Note   Patient: Ricardo Allen           Date of Birth: Jan 10, 1926           MRN: [id number] Visit Date: 03/05/2016              Requested by: Velna Hatchet, MD [place], [place] [id number] PCP: Velna Hatchet, MD   Assessment & Plan: Visit Diagnoses:  1. Chronic bilateral low back pain without sciatica     Plan: referral to preferred pain management made. F/u prn.    Follow-Up Instructions: Return if symptoms worsen or fail to improve.   Orders:  No orders of the defined types were placed in this encounter.  No orders of the defined types were placed in this encounter.     Procedures: No procedures performed   Clinical Data: No additional findings.   Subjective: Chief Complaint  Patient presents with  . Lower Back - Pain, Follow-up  . Left Hip - Pain, Follow-up    Patient f/u today for continued LBP and left hip pain.  Injections have not helped.  Wants to go back to pain clinic.    Review of Systems   Objective: Vital Signs: There were no vitals taken for this visit.  Physical Exam  Ortho Exam Left hip exam is benign Specialty Comments:  No specialty comments available.  Imaging: No results found.   PMFS History: Patient Active Problem List   Diagnosis Date Noted  . Chronic kidney disease 08/25/2013  . Intracranial hemorrhage ([place]) 04/04/2013  . Subdural hygroma 04/04/2013  . Restless leg syndrome 08/03/2012  . Diverticulosis of colon (without mention of hemorrhage) 06/21/2012  . Anemia 06/18/2012    Class: Acute  . GI bleed 06/18/2012    Class: Chronic  . Atrial fibrillation ([place]) 11/27/2009  . HYPOTENSION, ORTHOSTATIC 10/31/2009  . Other specified iron deficiency anemias 09/05/2009  . TRAUMATIC ARTHROPATHY PELVIC REGION AND THIGH 06/29/2009  . GOUT 03/22/2009  . NAUSEA 03/08/2009  . LUNG NODULE 03/29/2008  . WEIGHT LOSS, ABNORMAL 03/22/2008  . LOW BACK PAIN 02/03/2007  . LUMBAR RADICULOPATHY, LEFT  10/09/2006  . DM W/MANIFESTATION NEC, TYPE II, UNCONTROLLED 08/27/2006  . HYPERLIPIDEMIA 08/27/2006  . DEMENTIA 08/27/2006  . DEPRESSION 08/27/2006  . MELANOMA, MALIGNANT, TRUNK 08/19/2006  . Complex sleep apnea syndrome 08/19/2006   Past Medical History:  Diagnosis Date  . Anemia of other chronic disease 2008   EGD and colonoscopy in 2014 with diverticulosis only  . Atrial fibrillation ([place]) 11/27/2009  . 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 DEPRESSION 08/27/2006  . DISORDER, LUMBAR DISC W/MYELOPATHY 08/19/2006  . Diverticulosis   . GOUT 03/22/2009  . HYPERLIPIDEMIA 08/27/2006  . HYPOTENSION, ORTHOSTATIC 10/31/2009  . Impaired fasting glucose 01/27/2007  . LOW BACK PAIN 02/03/2007   Lumbar radiculopathy 09/2006  . LUNG NODULE 03/29/2008  . MELANOMA, MALIGNANT, TRUNK 08/19/2006   early stage; just exicion.   . OBSTRUCTIVE SLEEP APNEA 08/19/2006  . TOBACCO ABUSE 12/31/2007  . TRAUMATIC ARTHROPATHY PELVIC REGION AND THIGH 06/29/2009  . Wet senile macular degeneration (HCC)     Family History  Problem Relation Age of Onset  . Pancreatic cancer Mother   . Pancreatic cancer Mother   . Testicular cancer Father   . Arthritis Brother   . Cancer Maternal Aunt     head/neck cancer    Past Surgical History:  Procedure Laterality Date  . CATARACT EXTRACTION Bilateral   . COLONOSCOPY  N/A 06/21/2012   Procedure: COLONOSCOPY;  Surgeon: Inda Castle, MD;  Location: [facility];  Service: Endoscopy;  Laterality: N/A;  . ESOPHAGOGASTRODUODENOSCOPY N/A 06/20/2012   Procedure: ESOPHAGOGASTRODUODENOSCOPY (EGD);  Surgeon: Inda Castle, MD;  Location: [facility];  Service: Endoscopy;  Laterality: N/A;  . LUMBAR FUSION    . LUMBAR LAMINECTOMY    . MELANOMA EXCISION     left lower back  . TONSILECTOMY, ADENOIDECTOMY, BILATERAL MYRINGOTOMY AND TUBES     Social History   Occupational History  .  Retired    Theme park manager   Social History Main Topics  . Smoking status:  Former Smoker    Packs/day: 2.00    Years: 60.00    Types: Cigarettes    Quit date: 04/14/2008  . Smokeless tobacco: Never Used  . Alcohol use 4.4 oz/week    4 Glasses of wine, 4 Standard drinks or equivalent per week  . Drug use: No  . Sexual activity: No

## 2016-04-04 ENCOUNTER — Encounter (INDEPENDENT_AMBULATORY_CARE_PROVIDER_SITE_OTHER): Payer: Medicare Other | Admitting: Ophthalmology

## 2016-04-04 DIAGNOSIS — H43813 Vitreous degeneration, bilateral: Secondary | ICD-10-CM | POA: Diagnosis not present

## 2016-04-04 DIAGNOSIS — H353231 Exudative age-related macular degeneration, bilateral, with active choroidal neovascularization: Secondary | ICD-10-CM

## 2016-05-09 ENCOUNTER — Encounter (INDEPENDENT_AMBULATORY_CARE_PROVIDER_SITE_OTHER): Payer: Medicare Other | Admitting: Ophthalmology

## 2016-05-09 DIAGNOSIS — H353231 Exudative age-related macular degeneration, bilateral, with active choroidal neovascularization: Secondary | ICD-10-CM | POA: Diagnosis not present

## 2016-05-09 DIAGNOSIS — H43813 Vitreous degeneration, bilateral: Secondary | ICD-10-CM | POA: Diagnosis not present

## 2016-06-20 ENCOUNTER — Encounter (INDEPENDENT_AMBULATORY_CARE_PROVIDER_SITE_OTHER): Payer: Medicare Other | Admitting: Ophthalmology

## 2016-06-20 DIAGNOSIS — H353231 Exudative age-related macular degeneration, bilateral, with active choroidal neovascularization: Secondary | ICD-10-CM | POA: Diagnosis not present

## 2016-06-20 DIAGNOSIS — H43813 Vitreous degeneration, bilateral: Secondary | ICD-10-CM

## 2016-07-25 ENCOUNTER — Encounter (INDEPENDENT_AMBULATORY_CARE_PROVIDER_SITE_OTHER): Payer: Medicare Other | Admitting: Ophthalmology

## 2016-07-25 DIAGNOSIS — H353231 Exudative age-related macular degeneration, bilateral, with active choroidal neovascularization: Secondary | ICD-10-CM | POA: Diagnosis not present

## 2016-07-25 DIAGNOSIS — H43813 Vitreous degeneration, bilateral: Secondary | ICD-10-CM | POA: Diagnosis not present

## 2016-08-29 ENCOUNTER — Encounter (INDEPENDENT_AMBULATORY_CARE_PROVIDER_SITE_OTHER): Payer: Medicare Other | Admitting: Ophthalmology

## 2016-08-29 DIAGNOSIS — H353231 Exudative age-related macular degeneration, bilateral, with active choroidal neovascularization: Secondary | ICD-10-CM

## 2016-08-29 DIAGNOSIS — H43813 Vitreous degeneration, bilateral: Secondary | ICD-10-CM | POA: Diagnosis not present

## 2016-10-03 ENCOUNTER — Encounter (INDEPENDENT_AMBULATORY_CARE_PROVIDER_SITE_OTHER): Payer: Medicare Other | Admitting: Ophthalmology

## 2016-10-03 DIAGNOSIS — H353231 Exudative age-related macular degeneration, bilateral, with active choroidal neovascularization: Secondary | ICD-10-CM

## 2016-10-03 DIAGNOSIS — H43813 Vitreous degeneration, bilateral: Secondary | ICD-10-CM

## 2016-10-28 ENCOUNTER — Encounter (HOSPITAL_COMMUNITY): Payer: Self-pay | Admitting: Emergency Medicine

## 2016-10-28 ENCOUNTER — Emergency Department (HOSPITAL_COMMUNITY)
Admission: EM | Admit: 2016-10-28 | Discharge: 2016-10-28 | Disposition: A | Discharge status: Home / Self Care | Payer: Medicare Other | Attending: Emergency Medicine | Admitting: Emergency Medicine

## 2016-10-28 DIAGNOSIS — Z85828 Personal history of other malignant neoplasm of skin: Secondary | ICD-10-CM | POA: Insufficient documentation

## 2016-10-28 DIAGNOSIS — Z79899 Other long term (current) drug therapy: Secondary | ICD-10-CM | POA: Diagnosis not present

## 2016-10-28 DIAGNOSIS — L03116 Cellulitis of left lower limb: Secondary | ICD-10-CM

## 2016-10-28 DIAGNOSIS — I959 Hypotension, unspecified: Secondary | ICD-10-CM | POA: Insufficient documentation

## 2016-10-28 DIAGNOSIS — F0390 Unspecified dementia without behavioral disturbance: Secondary | ICD-10-CM | POA: Insufficient documentation

## 2016-10-28 DIAGNOSIS — Z87891 Personal history of nicotine dependence: Secondary | ICD-10-CM | POA: Insufficient documentation

## 2016-10-28 DIAGNOSIS — L02416 Cutaneous abscess of left lower limb: Secondary | ICD-10-CM | POA: Insufficient documentation

## 2016-10-28 DIAGNOSIS — R55 Syncope and collapse: Secondary | ICD-10-CM | POA: Diagnosis not present

## 2016-10-28 DIAGNOSIS — N189 Chronic kidney disease, unspecified: Secondary | ICD-10-CM | POA: Diagnosis not present

## 2016-10-28 DIAGNOSIS — F329 Major depressive disorder, single episode, unspecified: Secondary | ICD-10-CM | POA: Diagnosis not present

## 2016-10-28 LAB — BASIC METABOLIC PANEL
ANION GAP: 8 (ref 5–15)
BUN: 26 mg/dL — ABNORMAL HIGH (ref 6–20)
CALCIUM: 8.6 mg/dL — AB (ref 8.9–10.3)
CHLORIDE: 107 mmol/L (ref 101–111)
CO2: 23 mmol/L (ref 22–32)
Creatinine, Ser: 1.84 mg/dL — ABNORMAL HIGH (ref 0.61–1.24)
GFR calc Af Amer: 36 mL/min — ABNORMAL LOW (ref >60)
GFR calc non Af Amer: 31 mL/min — ABNORMAL LOW (ref >60)
GLUCOSE: 156 mg/dL — AB (ref 65–99)
Potassium: 4.4 mmol/L (ref 3.5–5.1)
Sodium: 138 mmol/L (ref 135–145)

## 2016-10-28 LAB — CBC WITH DIFFERENTIAL/PLATELET
Basophils Absolute: 0 10*3/uL (ref 0.0–0.1)
Basophils Relative: 0 %
Eosinophils Absolute: 0.3 10*3/uL (ref 0.0–0.7)
Eosinophils Relative: 3 %
HCT: 34.8 % — ABNORMAL LOW (ref 39.0–52.0)
HEMOGLOBIN: 10.9 g/dL — AB (ref 13.0–17.0)
Lymphocytes Relative: 11 %
Lymphs Abs: 1 10*3/uL (ref 0.7–4.0)
MCH: 29.9 pg (ref 26.0–34.0)
MCHC: 31.3 g/dL (ref 30.0–36.0)
MCV: 95.3 fL (ref 78.0–100.0)
MONO ABS: 0.6 10*3/uL (ref 0.1–1.0)
Monocytes Relative: 7 %
NEUTROS ABS: 7.1 10*3/uL (ref 1.7–7.7)
Neutrophils Relative %: 79 %
Platelets: 245 10*3/uL (ref 150–400)
RBC: 3.65 MIL/uL — ABNORMAL LOW (ref 4.22–5.81)
RDW: 14.3 % (ref 11.5–15.5)
WBC: 8.9 10*3/uL (ref 4.0–10.5)

## 2016-10-28 MED ORDER — SODIUM CHLORIDE 0.9 % IV BOLUS (SEPSIS)
500.0000 mL | Freq: Once | INTRAVENOUS | Status: AC
  Administered 2016-10-28: 500 mL via INTRAVENOUS

## 2016-10-28 MED ORDER — DOXYCYCLINE HYCLATE 100 MG PO CAPS: 100.0000 mg | ORAL_CAPSULE | Freq: Two times a day (BID) | ORAL | 0 refills | Status: DC

## 2016-10-28 NOTE — Discharge Instructions (Signed)
Continue warm compresses to the affected area.  Return for rapid spreading redness, fever.    Follow up with your PCP for your almost passing out.  Eat and drink well for the next couple of days.

## 2016-10-28 NOTE — ED Notes (Signed)
Pt verbalized understanding discharge instructions and denies any further needs or questions at this time. VS stable, ambulatory and steady gait.   

## 2016-10-28 NOTE — ED Provider Notes (Signed)
[facility] DEPT Provider Note   CSN: [id number] Arrival date & time: 10/28/16  1114     History   Chief Complaint Chief Complaint  Patient presents with  . Near Syncope    HPI Ricardo Allen is a 83 y.o. male.  83 yo M with a chief complaint of a syncopal event. The patient stood up to a coffee felt lightheaded and was brought down to a sitting position. The patient on scene was noted to be hypotensive. His blood pressure improved significantly prior to the ED. The patient's currently asymptomatic. Denied chest pain shortness breath headache or neck pain during the incident. Denies nausea or vomiting. Patient has had chronic diarrhea every second or third day for the past year or so. This been worked up by his family physician. He denies blood in the stool or dark stool. Last loose bowel movement was this morning. Denies decreased oral intake. The patient is also been complaining of boils throughout his body. Was recently had a couple on the left lateral leg. These improved with warm compresses. Denies fevers.   The history is provided by the patient.  Illness  This is a new problem. The current episode started 1 to 2 hours ago. The problem occurs rarely. The problem has been resolved. Pertinent negatives include no chest pain, no abdominal pain, no headaches and no shortness of breath. Nothing aggravates the symptoms. Nothing relieves the symptoms. He has tried nothing for the symptoms. The treatment provided no relief.    Past Medical History:  Diagnosis Date  . Anemia of other chronic disease 2008   EGD and colonoscopy in 2014 with diverticulosis only  . Atrial fibrillation ([place]) 11/27/2009  . 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 DEPRESSION 08/27/2006  . DISORDER, LUMBAR DISC W/MYELOPATHY 08/19/2006  . Diverticulosis   . GOUT 03/22/2009  . HYPERLIPIDEMIA 08/27/2006  . HYPOTENSION, ORTHOSTATIC 10/31/2009  . Impaired fasting glucose 01/27/2007  . LOW BACK PAIN 02/03/2007    Lumbar radiculopathy 09/2006  . LUNG NODULE 03/29/2008  . MELANOMA, MALIGNANT, TRUNK 08/19/2006   early stage; just exicion.   . OBSTRUCTIVE SLEEP APNEA 08/19/2006  . TOBACCO ABUSE 12/31/2007  . TRAUMATIC ARTHROPATHY PELVIC REGION AND THIGH 06/29/2009  . Wet senile macular degeneration [facility])     Patient Active Problem List   Diagnosis Date Noted  . Chronic kidney disease 08/25/2013  . Intracranial hemorrhage ([place]) 04/04/2013  . Subdural hygroma 04/04/2013  . Restless leg syndrome 08/03/2012  . Diverticulosis of colon (without mention of hemorrhage) 06/21/2012  . Anemia 06/18/2012    Class: Acute  . GI bleed 06/18/2012    Class: Chronic  . Atrial fibrillation ([place]) 11/27/2009  . HYPOTENSION, ORTHOSTATIC 10/31/2009  . Other specified iron deficiency anemias 09/05/2009  . TRAUMATIC ARTHROPATHY PELVIC REGION AND THIGH 06/29/2009  . GOUT 03/22/2009  . NAUSEA 03/08/2009  . LUNG NODULE 03/29/2008  . WEIGHT LOSS, ABNORMAL 03/22/2008  . LOW BACK PAIN 02/03/2007  . LUMBAR RADICULOPATHY, LEFT 10/09/2006  . DM W/MANIFESTATION NEC, TYPE II, UNCONTROLLED 08/27/2006  . HYPERLIPIDEMIA 08/27/2006  . DEMENTIA 08/27/2006  . DEPRESSION 08/27/2006  . MELANOMA, MALIGNANT, TRUNK 08/19/2006  . Complex sleep apnea syndrome 08/19/2006    Past Surgical History:  Procedure Laterality Date  . CATARACT EXTRACTION Bilateral   . COLONOSCOPY N/A 06/21/2012   Procedure: COLONOSCOPY;  Surgeon: Inda Castle, MD;  Location: [facility] ENDOSCOPY;  Service: Endoscopy;  Laterality: N/A;  . ESOPHAGOGASTRODUODENOSCOPY N/A 06/20/2012   Procedure: ESOPHAGOGASTRODUODENOSCOPY (  EGD);  Surgeon: Inda Castle, MD;  Location: [facility] ENDOSCOPY;  Service: Endoscopy;  Laterality: N/A;  . LUMBAR FUSION    . LUMBAR LAMINECTOMY    . MELANOMA EXCISION     left lower back  . TONSILECTOMY, ADENOIDECTOMY, BILATERAL MYRINGOTOMY AND TUBES         Home Medications    Prior to Admission medications   Medication Sig Start Date End Date  Taking? Authorizing Provider  alendronate (FOSAMAX) 70 MG tablet Take 70 mg by mouth every Sunday. Take with a full glass of water on an empty stomach.    [provider]  allopurinol (ZYLOPRIM) 100 MG tablet Take 100 mg by mouth daily as needed (for gout).    [provider]  buPROPion (WELLBUTRIN XL) 150 MG 24 hr tablet Take 150 mg by mouth daily. 03/25/14   [provider]  cephALEXin (KEFLEX) 500 MG capsule Take 1 capsule (500 mg total) by mouth 3 (three) times daily. Patient not taking: Reported on 04/19/2014 10/02/13   Davonna Belling, MD  cephALEXin (KEFLEX) 500 MG capsule Take 1 capsule (500 mg total) by mouth 2 (two) times daily. X 7 days 04/19/14   Street, [place], PA-C  citalopram (CELEXA) 20 MG tablet Take 20 mg by mouth daily.    [provider]  colchicine 0.6 MG tablet Take 0.6 mg by mouth daily as needed (for gout).    [provider]  digoxin (LANOXIN) 0.125 MG tablet Take 0.125 mg by mouth See admin instructions. BPZWCHEN only    [provider]  docusate sodium (COLACE) 100 MG capsule Take 100 mg by mouth daily as needed for mild constipation.    [provider]  doxycycline (VIBRAMYCIN) 100 MG capsule Take 1 capsule (100 mg total) by mouth 2 (two) times daily. 10/28/16   Deno Etienne, DO  HYDROcodone-acetaminophen (NORCO) 10-325 MG per tablet Take 1 tablet by mouth every 6 (six) hours as needed for moderate pain.    [provider]  levofloxacin (LEVAQUIN) 750 MG tablet Take 1 tablet (750 mg total) by mouth daily. X 7 days Patient not taking: Reported on 04/19/2014 12/21/13   Evelina Bucy, MD  metoprolol tartrate (LOPRESSOR) 25 MG tablet Take 25 mg by mouth 2 (two) times daily.    [provider]  morphine (MS CONTIN) 30 MG 12 hr tablet Take 30 mg by mouth every 12 (twelve) hours.    [provider]  naloxegol oxalate (MOVANTIK) 25 MG TABS tablet Take 25 mg by mouth daily.    [provider]  pantoprazole (PROTONIX) 40 MG tablet Take 40 mg by mouth daily.    [provider]  polyethylene glycol (MIRALAX / GLYCOLAX) packet Take 17 g by mouth daily as needed (constipation).    [provider]  Probiotic Product (PROBIOTIC DAILY PO) Take 1 capsule by mouth daily.     [provider]  Testosterone (ANDROGEL) 20.25 MG/1.25GM (1.62%) GEL Place 1 application onto the skin daily.    [provider]    Family History Family History  Problem Relation Age of Onset  . Pancreatic cancer Mother   . Testicular cancer Father   . Arthritis Brother   . Cancer Maternal Aunt        head/neck cancer    Social History Social History  Substance Use Topics  . Smoking status: Former Smoker    Packs/day: 2.00    Years: 60.00    Types: Cigarettes    Quit date:  04/14/2008  . Smokeless tobacco: Never Used  . Alcohol use 4.4 oz/week    4 Glasses of wine, 4 Standard drinks or equivalent per week     Allergies   Fentanyl; Hydromorphone hcl; and Sulfonamide derivatives   Review of Systems Review of Systems  Constitutional: Negative for chills and fever.  HENT: Negative for congestion and facial swelling.   Eyes: Negative for discharge and visual disturbance.  Respiratory: Negative for shortness of breath.   Cardiovascular: Negative for chest pain and palpitations.  Gastrointestinal: Positive for diarrhea. Negative for abdominal pain and vomiting.  Musculoskeletal: Negative for arthralgias and myalgias.  Skin: Positive for color change and wound. Negative for rash.  Neurological: Positive for syncope and light-headedness. Negative for tremors and headaches.  Psychiatric/Behavioral: Negative for confusion and dysphoric mood.     Physical Exam Updated Vital Signs BP (!) 144/74   Pulse 72   Temp 97.8 F (36.6 C) (Oral)   Resp 16   Ht 6' (1.829 m)   Wt 77.1 kg (170 lb)   SpO2 100%   BMI 23.06 kg/m   Physical Exam  Constitutional: He is  oriented to person, place, and time. He appears well-developed and well-nourished.  Appears much younger than stated age  HENT:  Head: Normocephalic and atraumatic.  Eyes: Pupils are equal, round, and reactive to light. EOM are normal.  Neck: Normal range of motion. Neck supple. No JVD present.  Cardiovascular: Normal rate and regular rhythm.  Exam reveals no gallop and no friction rub.   No murmur heard. Pulmonary/Chest: No respiratory distress. He has no wheezes.  Abdominal: He exhibits no distension and no mass. There is no tenderness. There is no rebound and no guarding.  Musculoskeletal: Normal range of motion.  Neurological: He is alert and oriented to person, place, and time.  Skin: No rash noted. No pallor.  Erythema and small punctate areas that were likely abscesses. This is to the left lateral upper leg. Some desquamation  Psychiatric: He has a normal mood and affect. His behavior is normal.  Nursing note and vitals reviewed.    ED Treatments / Results  Labs (all labs ordered are listed, but only abnormal results are displayed) Labs Reviewed  CBC WITH DIFFERENTIAL/PLATELET - Abnormal; Notable for the following:       Result Value   RBC 3.65 (*)    Hemoglobin 10.9 (*)    HCT 34.8 (*)    All other components within normal limits  BASIC METABOLIC PANEL - Abnormal; Notable for the following:    Glucose, Bld 156 (*)    BUN 26 (*)    Creatinine, Ser 1.84 (*)    Calcium 8.6 (*)    GFR calc non Af Amer 31 (*)    GFR calc Af Amer 36 (*)    All other components within normal limits    EKG  EKG Interpretation None       Radiology No results found.  Procedures Procedures (including critical care time)  Medications Ordered in ED Medications  sodium chloride 0.9 % bolus 500 mL (0 mLs Intravenous Stopped 10/28/16 1246)     Initial Impression / Assessment and Plan / ED Course  I have reviewed the triage vital signs and the nursing notes.  Pertinent labs &  imaging results that were available during my care of the patient were reviewed by me and considered in my medical decision making (see chart for details).     83 yo M With a chief complaint  of near syncope. Sounds vasovagal by history. The patient had had diarrhea recently saw labs were obtained. He has had a very mild elevation in his renal function from prior. Was given fluids. Patient asymptomatic throughout his stay. He does have some erythema consistent with cellulitis to the left lateral leg. Will start on antibiotics. Discharge home.  1:40 PM:  I have discussed the diagnosis/risks/treatment options with the patient and family and believe the pt to be eligible for discharge home to follow-up with PCP. We also discussed returning to the ED immediately if new or worsening sx occur. We discussed the sx which are most concerning (e.g., sudden worsening pain, fever, inability to tolerate by mouth) that necessitate immediate return. Medications administered to the patient during their visit and any new prescriptions provided to the patient are listed below.  Medications given during this visit Medications  sodium chloride 0.9 % bolus 500 mL (0 mLs Intravenous Stopped 10/28/16 1246)     The patient appears reasonably screen and/or stabilized for discharge and I doubt any other medical condition or other [facility] requiring further screening, evaluation, or treatment in the ED at this time prior to discharge.    Final Clinical Impressions(s) / ED Diagnoses   Final diagnoses:  Near syncope  Cellulitis and abscess of left leg    New Prescriptions New Prescriptions   DOXYCYCLINE (VIBRAMYCIN) 100 MG CAPSULE    Take 1 capsule (100 mg total) by mouth 2 (two) times daily.     Deno Etienne, DO 10/28/16 1340

## 2016-10-28 NOTE — ED Triage Notes (Signed)
Per EMS: Pt had near syncopal episode, Pt stood up from bed and started to feel dizzy.  Pt's wife pushed him on bed so he did not hit the floor.  Pt had diarrhea x 6 months, working on it with PCP.   1 L NS 124/74 64 NSR 114 CBG 18 RR  A&Ox4

## 2016-10-31 ENCOUNTER — Encounter (INDEPENDENT_AMBULATORY_CARE_PROVIDER_SITE_OTHER): Payer: Medicare Other | Admitting: Ophthalmology

## 2016-10-31 DIAGNOSIS — H43813 Vitreous degeneration, bilateral: Secondary | ICD-10-CM | POA: Diagnosis not present

## 2016-10-31 DIAGNOSIS — H353231 Exudative age-related macular degeneration, bilateral, with active choroidal neovascularization: Secondary | ICD-10-CM

## 2016-11-28 ENCOUNTER — Encounter (INDEPENDENT_AMBULATORY_CARE_PROVIDER_SITE_OTHER): Payer: Medicare Other | Admitting: Ophthalmology

## 2016-11-28 DIAGNOSIS — H353231 Exudative age-related macular degeneration, bilateral, with active choroidal neovascularization: Secondary | ICD-10-CM | POA: Diagnosis not present

## 2016-11-28 DIAGNOSIS — H43813 Vitreous degeneration, bilateral: Secondary | ICD-10-CM

## 2016-12-31 ENCOUNTER — Encounter (INDEPENDENT_AMBULATORY_CARE_PROVIDER_SITE_OTHER): Payer: Medicare Other | Admitting: Ophthalmology

## 2016-12-31 DIAGNOSIS — H43813 Vitreous degeneration, bilateral: Secondary | ICD-10-CM

## 2016-12-31 DIAGNOSIS — H353231 Exudative age-related macular degeneration, bilateral, with active choroidal neovascularization: Secondary | ICD-10-CM | POA: Diagnosis not present

## 2017-02-04 ENCOUNTER — Encounter (INDEPENDENT_AMBULATORY_CARE_PROVIDER_SITE_OTHER): Payer: Medicare Other | Admitting: Ophthalmology

## 2017-02-04 DIAGNOSIS — H353231 Exudative age-related macular degeneration, bilateral, with active choroidal neovascularization: Secondary | ICD-10-CM | POA: Diagnosis not present

## 2017-02-04 DIAGNOSIS — H43813 Vitreous degeneration, bilateral: Secondary | ICD-10-CM

## 2017-02-05 ENCOUNTER — Encounter (HOSPITAL_COMMUNITY): Payer: Self-pay | Admitting: Emergency Medicine

## 2017-02-05 ENCOUNTER — Observation Stay (HOSPITAL_COMMUNITY)
Admission: EM | Admit: 2017-02-05 | Discharge: 2017-02-06 | Disposition: A | Discharge status: Home / Self Care | Payer: Medicare Other | Attending: Internal Medicine | Admitting: Internal Medicine

## 2017-02-05 ENCOUNTER — Other Ambulatory Visit: Payer: Self-pay

## 2017-02-05 ENCOUNTER — Emergency Department (HOSPITAL_COMMUNITY): Payer: Medicare Other

## 2017-02-05 DIAGNOSIS — R531 Weakness: Secondary | ICD-10-CM | POA: Diagnosis not present

## 2017-02-05 DIAGNOSIS — I517 Cardiomegaly: Secondary | ICD-10-CM | POA: Insufficient documentation

## 2017-02-05 DIAGNOSIS — G4733 Obstructive sleep apnea (adult) (pediatric): Secondary | ICD-10-CM | POA: Insufficient documentation

## 2017-02-05 DIAGNOSIS — E119 Type 2 diabetes mellitus without complications: Secondary | ICD-10-CM | POA: Insufficient documentation

## 2017-02-05 DIAGNOSIS — F329 Major depressive disorder, single episode, unspecified: Secondary | ICD-10-CM | POA: Diagnosis not present

## 2017-02-05 DIAGNOSIS — Z87891 Personal history of nicotine dependence: Secondary | ICD-10-CM | POA: Diagnosis not present

## 2017-02-05 DIAGNOSIS — Z8719 Personal history of other diseases of the digestive system: Secondary | ICD-10-CM | POA: Insufficient documentation

## 2017-02-05 DIAGNOSIS — N183 Chronic kidney disease, stage 3 (moderate): Secondary | ICD-10-CM

## 2017-02-05 DIAGNOSIS — Z888 Allergy status to other drugs, medicaments and biological substances status: Secondary | ICD-10-CM | POA: Insufficient documentation

## 2017-02-05 DIAGNOSIS — Z85820 Personal history of malignant melanoma of skin: Secondary | ICD-10-CM | POA: Diagnosis not present

## 2017-02-05 DIAGNOSIS — F0390 Unspecified dementia without behavioral disturbance: Secondary | ICD-10-CM | POA: Diagnosis not present

## 2017-02-05 DIAGNOSIS — M109 Gout, unspecified: Secondary | ICD-10-CM | POA: Diagnosis not present

## 2017-02-05 DIAGNOSIS — I10 Essential (primary) hypertension: Secondary | ICD-10-CM

## 2017-02-05 DIAGNOSIS — R112 Nausea with vomiting, unspecified: Secondary | ICD-10-CM

## 2017-02-05 DIAGNOSIS — R9389 Abnormal findings on diagnostic imaging of other specified body structures: Secondary | ICD-10-CM

## 2017-02-05 DIAGNOSIS — Z885 Allergy status to narcotic agent status: Secondary | ICD-10-CM | POA: Insufficient documentation

## 2017-02-05 DIAGNOSIS — J90 Pleural effusion, not elsewhere classified: Secondary | ICD-10-CM | POA: Insufficient documentation

## 2017-02-05 DIAGNOSIS — I4891 Unspecified atrial fibrillation: Secondary | ICD-10-CM | POA: Diagnosis not present

## 2017-02-05 DIAGNOSIS — I452 Bifascicular block: Secondary | ICD-10-CM | POA: Insufficient documentation

## 2017-02-05 DIAGNOSIS — J449 Chronic obstructive pulmonary disease, unspecified: Secondary | ICD-10-CM | POA: Diagnosis not present

## 2017-02-05 DIAGNOSIS — J189 Pneumonia, unspecified organism: Secondary | ICD-10-CM | POA: Diagnosis present

## 2017-02-05 DIAGNOSIS — J13 Pneumonia due to Streptococcus pneumoniae: Principal | ICD-10-CM

## 2017-02-05 DIAGNOSIS — E785 Hyperlipidemia, unspecified: Secondary | ICD-10-CM | POA: Diagnosis not present

## 2017-02-05 DIAGNOSIS — J181 Lobar pneumonia, unspecified organism: Secondary | ICD-10-CM | POA: Diagnosis not present

## 2017-02-05 DIAGNOSIS — Z79899 Other long term (current) drug therapy: Secondary | ICD-10-CM | POA: Diagnosis not present

## 2017-02-05 DIAGNOSIS — G2581 Restless legs syndrome: Secondary | ICD-10-CM | POA: Diagnosis not present

## 2017-02-05 DIAGNOSIS — Z882 Allergy status to sulfonamides status: Secondary | ICD-10-CM | POA: Diagnosis not present

## 2017-02-05 LAB — CBC
HCT: 39.5 % (ref 39.0–52.0)
Hemoglobin: 12.9 g/dL — ABNORMAL LOW (ref 13.0–17.0)
MCH: 30.9 pg (ref 26.0–34.0)
MCHC: 32.7 g/dL (ref 30.0–36.0)
MCV: 94.5 fL (ref 78.0–100.0)
PLATELETS: 286 10*3/uL (ref 150–400)
RBC: 4.18 MIL/uL — AB (ref 4.22–5.81)
RDW: 14.8 % (ref 11.5–15.5)
WBC: 13.8 10*3/uL — AB (ref 4.0–10.5)

## 2017-02-05 LAB — URINALYSIS, ROUTINE W REFLEX MICROSCOPIC
Bilirubin Urine: NEGATIVE
Glucose, UA: NEGATIVE mg/dL
Hgb urine dipstick: NEGATIVE
KETONES UR: NEGATIVE mg/dL
LEUKOCYTES UA: NEGATIVE
NITRITE: NEGATIVE
PROTEIN: NEGATIVE mg/dL
Specific Gravity, Urine: 1.011 (ref 1.005–1.030)
pH: 7 (ref 5.0–8.0)

## 2017-02-05 LAB — HEPATIC FUNCTION PANEL
ALBUMIN: 3.4 g/dL — AB (ref 3.5–5.0)
ALT: 13 U/L — ABNORMAL LOW (ref 17–63)
AST: 27 U/L (ref 15–41)
Alkaline Phosphatase: 131 U/L — ABNORMAL HIGH (ref 38–126)
Bilirubin, Direct: 0.2 mg/dL (ref 0.1–0.5)
Indirect Bilirubin: 0.5 mg/dL (ref 0.3–0.9)
TOTAL PROTEIN: 7.1 g/dL (ref 6.5–8.1)
Total Bilirubin: 0.7 mg/dL (ref 0.3–1.2)

## 2017-02-05 LAB — BASIC METABOLIC PANEL
Anion gap: 12 (ref 5–15)
BUN: 21 mg/dL — AB (ref 6–20)
CO2: 22 mmol/L (ref 22–32)
CREATININE: 1.66 mg/dL — AB (ref 0.61–1.24)
Calcium: 9 mg/dL (ref 8.9–10.3)
Chloride: 105 mmol/L (ref 101–111)
GFR, EST AFRICAN AMERICAN: 41 mL/min — AB (ref >60)
GFR, EST NON AFRICAN AMERICAN: 35 mL/min — AB (ref >60)
Glucose, Bld: 152 mg/dL — ABNORMAL HIGH (ref 65–99)
POTASSIUM: 4.6 mmol/L (ref 3.5–5.1)
Sodium: 139 mmol/L (ref 135–145)

## 2017-02-05 LAB — CBG MONITORING, ED: Glucose-Capillary: 115 mg/dL — ABNORMAL HIGH (ref 65–99)

## 2017-02-05 LAB — PROCALCITONIN: Procalcitonin: 2.14 ng/mL

## 2017-02-05 LAB — LIPASE, BLOOD: LIPASE: 32 U/L (ref 11–51)

## 2017-02-05 MED ORDER — BUPROPION HCL ER (XL) 150 MG PO TB24
150.0000 mg | ORAL_TABLET | Freq: Every day | ORAL | Status: DC
  Administered 2017-02-06: 150 mg via ORAL
  Filled 2017-02-05: qty 1

## 2017-02-05 MED ORDER — ROPINIROLE HCL 1 MG PO TABS
0.5000 mg | ORAL_TABLET | Freq: Every day | ORAL | Status: DC
Start: 2017-02-05 — End: 2017-02-06
  Administered 2017-02-05: 0.5 mg via ORAL
  Filled 2017-02-05: qty 1

## 2017-02-05 MED ORDER — BENZONATATE 100 MG PO CAPS: 100.0000 mg | ORAL_CAPSULE | Freq: Three times a day (TID) | ORAL | Status: DC | PRN

## 2017-02-05 MED ORDER — RISAQUAD PO CAPS
1.0000 | ORAL_CAPSULE | Freq: Every day | ORAL | Status: DC
  Administered 2017-02-05 – 2017-02-06 (×2): 1 via ORAL
  Filled 2017-02-05 (×3): qty 1

## 2017-02-05 MED ORDER — ONDANSETRON HCL 4 MG PO TABS: 4.0000 mg | ORAL_TABLET | Freq: Four times a day (QID) | ORAL | Status: DC | PRN

## 2017-02-05 MED ORDER — OXYCODONE-ACETAMINOPHEN 5-325 MG PO TABS: 1.0000 | ORAL_TABLET | Freq: Two times a day (BID) | ORAL | Status: DC | PRN

## 2017-02-05 MED ORDER — PROMETHAZINE HCL 25 MG/ML IJ SOLN
6.2500 mg | Freq: Once | INTRAMUSCULAR | Status: AC
  Administered 2017-02-05: 6.25 mg via INTRAVENOUS
  Filled 2017-02-05: qty 1

## 2017-02-05 MED ORDER — SODIUM CHLORIDE 0.9 % IV BOLUS (SEPSIS)
1000.0000 mL | Freq: Once | INTRAVENOUS | Status: AC
  Administered 2017-02-05: 1000 mL via INTRAVENOUS

## 2017-02-05 MED ORDER — VITAMIN D 1000 UNITS PO TABS
2000.0000 [IU] | ORAL_TABLET | Freq: Every day | ORAL | Status: DC
  Administered 2017-02-06: 2000 [IU] via ORAL
  Filled 2017-02-05 (×2): qty 2

## 2017-02-05 MED ORDER — ACETAMINOPHEN 325 MG PO TABS
325.0000 mg | ORAL_TABLET | Freq: Four times a day (QID) | ORAL | Status: DC
  Administered 2017-02-05 – 2017-02-06 (×3): 325 mg via ORAL
  Filled 2017-02-05 (×3): qty 1

## 2017-02-05 MED ORDER — COLCHICINE 0.6 MG PO TABS: 0.6000 mg | ORAL_TABLET | Freq: Every day | ORAL | Status: DC | PRN

## 2017-02-05 MED ORDER — METOPROLOL TARTRATE 25 MG PO TABS
25.0000 mg | ORAL_TABLET | Freq: Two times a day (BID) | ORAL | Status: DC
Start: 2017-02-05 — End: 2017-02-06
  Administered 2017-02-05 – 2017-02-06 (×2): 25 mg via ORAL
  Filled 2017-02-05 (×2): qty 1

## 2017-02-05 MED ORDER — LOPERAMIDE HCL 2 MG PO CAPS: 2.0000 mg | ORAL_CAPSULE | Freq: Three times a day (TID) | ORAL | Status: DC | PRN

## 2017-02-05 MED ORDER — BUSPIRONE HCL 10 MG PO TABS
10.0000 mg | ORAL_TABLET | Freq: Two times a day (BID) | ORAL | Status: DC
  Administered 2017-02-06: 10 mg via ORAL
  Filled 2017-02-05 (×2): qty 1

## 2017-02-05 MED ORDER — HYDRALAZINE HCL 20 MG/ML IJ SOLN: 10.0000 mg | INTRAMUSCULAR | Status: DC | PRN

## 2017-02-05 MED ORDER — DEXTROSE 5 % IV SOLN
1.0000 g | Freq: Once | INTRAVENOUS | Status: AC
  Administered 2017-02-05: 1 g via INTRAVENOUS
  Filled 2017-02-05: qty 10

## 2017-02-05 MED ORDER — AZITHROMYCIN 500 MG PO TABS
500.0000 mg | ORAL_TABLET | Freq: Every day | ORAL | Status: DC
  Administered 2017-02-06: 500 mg via ORAL
  Filled 2017-02-05: qty 1

## 2017-02-05 MED ORDER — HEPARIN SODIUM (PORCINE) 5000 UNIT/ML IJ SOLN
5000.0000 [IU] | Freq: Three times a day (TID) | INTRAMUSCULAR | Status: DC
  Administered 2017-02-05 – 2017-02-06 (×3): 5000 [IU] via SUBCUTANEOUS
  Filled 2017-02-05 (×2): qty 1

## 2017-02-05 MED ORDER — CITALOPRAM HYDROBROMIDE 20 MG PO TABS: 20.0000 mg | ORAL_TABLET | Freq: Every day | ORAL | Status: DC

## 2017-02-05 MED ORDER — LORATADINE 10 MG PO TABS: 10.0000 mg | ORAL_TABLET | Freq: Every day | ORAL | Status: DC | PRN

## 2017-02-05 MED ORDER — AZITHROMYCIN 250 MG PO TABS
500.0000 mg | ORAL_TABLET | Freq: Once | ORAL | Status: AC
  Administered 2017-02-05: 500 mg via ORAL
  Filled 2017-02-05: qty 2

## 2017-02-05 MED ORDER — FLUTICASONE PROPIONATE 50 MCG/ACT NA SUSP
1.0000 | Freq: Every day | NASAL | Status: DC
  Administered 2017-02-06: 1 via NASAL
  Filled 2017-02-05: qty 16

## 2017-02-05 MED ORDER — SODIUM CHLORIDE 0.9 % IV SOLN
INTRAVENOUS | Status: DC
  Administered 2017-02-05: 20:00:00 via INTRAVENOUS

## 2017-02-05 MED ORDER — ALLOPURINOL 300 MG PO TABS
300.0000 mg | ORAL_TABLET | Freq: Every day | ORAL | Status: DC
  Administered 2017-02-06: 300 mg via ORAL
  Filled 2017-02-05: qty 1

## 2017-02-05 MED ORDER — ALBUTEROL SULFATE (2.5 MG/3ML) 0.083% IN NEBU: 2.5000 mg | INHALATION_SOLUTION | RESPIRATORY_TRACT | Status: DC | PRN

## 2017-02-05 MED ORDER — DEXTROSE 5 % IV SOLN
1.0000 g | INTRAVENOUS | Status: DC
  Filled 2017-02-05: qty 10

## 2017-02-05 MED ORDER — ACETAMINOPHEN ER 650 MG PO TBCR: 650.0000 mg | EXTENDED_RELEASE_TABLET | Freq: Two times a day (BID) | ORAL | Status: DC

## 2017-02-05 MED ORDER — ONDANSETRON HCL 4 MG/2ML IJ SOLN: 4.0000 mg | Freq: Four times a day (QID) | INTRAMUSCULAR | Status: DC | PRN

## 2017-02-05 NOTE — ED Notes (Signed)
Ambulated pt in hall with walker , pt unstable on feet and needed max assist to walk

## 2017-02-05 NOTE — Progress Notes (Signed)
Pharmacy Antibiotic Note  Ricardo Allen is a 84 y.o. male admitted on 02/05/2017 with pneumonia.  Pharmacy has been consulted for ceftriaxone dosing. WBC elevated at 13.8.   Plan: -Ceftriaxone 1 gm IV Q 24 hours -Azithromycin per MD -Pharmacy to sign off as ceftriaxone does not require any renal dosage adjustments   Weight: 170 lb (77.1 kg)  Temp (24hrs), Avg:97.3 F (36.3 C), Min:97.3 F (36.3 C), Max:97.3 F (36.3 C)  Recent Labs  Lab 02/05/17 1120  WBC 13.8*  CREATININE 1.66*    Estimated Creatinine Clearance: 32.9 mL/min (A) (by C-G formula based on SCr of 1.66 mg/dL (H)).    Allergies  Allergen Reactions  . Fentanyl Nausea Only and Other (See Comments)    REACTION: nausea and depression  . Hydromorphone Hcl Nausea Only and Other (See Comments)    REACTION: depressed and nausea  . Sulfonamide Derivatives Hives    REACTION: hives    Thank you for allowing pharmacy to be a part of this patient's care.  Albertina Parr, PharmD., BCPS Clinical Pharmacist Pager [phone]

## 2017-02-05 NOTE — ED Provider Notes (Addendum)
[facility] EMERGENCY DEPARTMENT Provider Note   CSN: [id number] Arrival date & time: 02/05/17  1024     History   Chief Complaint Chief Complaint  Patient presents with  . Near Syncope  . N/V    HPI Ricardo Allen is a 84 y.o. male.  HPI  84 y.o male A. fib, COPD presents today with a near syncopal episode.  States that he was at his assisted living care facility this morning when he and vomited.  He became very lightheaded.  He states he has a history of having hypotension with his nausea. He did not pass out.  Denies chest pain or sob.   Past Medical History:  Diagnosis Date  . Anemia of other chronic disease 2008   EGD and colonoscopy in 2014 with diverticulosis only  . Atrial fibrillation ([place]) 11/27/2009  . 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 DEPRESSION 08/27/2006  . DISORDER, LUMBAR DISC W/MYELOPATHY 08/19/2006  . Diverticulosis   . GOUT 03/22/2009  . HYPERLIPIDEMIA 08/27/2006  . HYPOTENSION, ORTHOSTATIC 10/31/2009  . Impaired fasting glucose 01/27/2007  . LOW BACK PAIN 02/03/2007   Lumbar radiculopathy 09/2006  . LUNG NODULE 03/29/2008  . MELANOMA, MALIGNANT, TRUNK 08/19/2006   early stage; just exicion.   . OBSTRUCTIVE SLEEP APNEA 08/19/2006  . TOBACCO ABUSE 12/31/2007  . TRAUMATIC ARTHROPATHY PELVIC REGION AND THIGH 06/29/2009  . Wet senile macular degeneration [facility])     Patient Active Problem List   Diagnosis Date Noted  . Chronic kidney disease 08/25/2013  . Intracranial hemorrhage ([place]) 04/04/2013  . Subdural hygroma 04/04/2013  . Restless leg syndrome 08/03/2012  . Diverticulosis of colon (without mention of hemorrhage) 06/21/2012  . Anemia 06/18/2012    Class: Acute  . GI bleed 06/18/2012    Class: Chronic  . Atrial fibrillation ([place]) 11/27/2009  . HYPOTENSION, ORTHOSTATIC 10/31/2009  . Other specified iron deficiency anemias 09/05/2009  . TRAUMATIC ARTHROPATHY PELVIC REGION AND THIGH 06/29/2009  . GOUT 03/22/2009  . NAUSEA  03/08/2009  . LUNG NODULE 03/29/2008  . WEIGHT LOSS, ABNORMAL 03/22/2008  . LOW BACK PAIN 02/03/2007  . LUMBAR RADICULOPATHY, LEFT 10/09/2006  . DM W/MANIFESTATION NEC, TYPE II, UNCONTROLLED 08/27/2006  . HYPERLIPIDEMIA 08/27/2006  . DEMENTIA 08/27/2006  . DEPRESSION 08/27/2006  . MELANOMA, MALIGNANT, TRUNK 08/19/2006  . Complex sleep apnea syndrome 08/19/2006    Past Surgical History:  Procedure Laterality Date  . CATARACT EXTRACTION Bilateral   . COLONOSCOPY N/A 06/21/2012   Procedure: COLONOSCOPY;  Surgeon: Inda Castle, MD;  Location: [facility] ENDOSCOPY;  Service: Endoscopy;  Laterality: N/A;  . ESOPHAGOGASTRODUODENOSCOPY N/A 06/20/2012   Procedure: ESOPHAGOGASTRODUODENOSCOPY (EGD);  Surgeon: Inda Castle, MD;  Location: [facility];  Service: Endoscopy;  Laterality: N/A;  . LUMBAR FUSION    . LUMBAR LAMINECTOMY    . MELANOMA EXCISION     left lower back  . TONSILECTOMY, ADENOIDECTOMY, BILATERAL MYRINGOTOMY AND TUBES         Home Medications    Prior to Admission medications   Medication Sig Start Date End Date Taking? Authorizing Provider  acetaminophen (TYLENOL) 650 MG CR tablet Take 650 mg by mouth 2 (two) times daily.   Yes [provider]  allopurinol (ZYLOPRIM) 300 MG tablet Take 300 mg by mouth daily.   Yes [provider]  benzonatate (TESSALON) 100 MG capsule Take 100 mg by mouth every 8 (eight) hours as needed for cough.   Yes [provider]  buPROPion (WELLBUTRIN XL) 150 MG 24 hr tablet Take 150 mg by mouth daily. 03/25/14  Yes [provider]  busPIRone (BUSPAR) 10 MG tablet Take 10 mg by mouth 2 (two) times daily.   Yes [provider]  Cholecalciferol (VITAMIN D3) 2000 units TABS Take 2,000 Units by mouth daily.   Yes [provider]  ciclopirox (PENLAC) 8 % solution Apply 1 application topically at bedtime. Apply over nail and surrounding skin. Apply daily over previous coat. After seven (7) days, may remove  with alcohol and continue cycle.   Yes [provider]  citalopram (CELEXA) 20 MG tablet Take 20 mg by mouth daily.   Yes [provider]  colchicine 0.6 MG tablet Take 0.6 mg by mouth daily as needed (for gout).   Yes [provider]  fluticasone (FLONASE) 50 MCG/ACT nasal spray Place 1 spray into both nostrils daily.   Yes [provider]  ibuprofen (ADVIL,MOTRIN) 600 MG tablet Take 600 mg by mouth 2 (two) times daily as needed for moderate pain.   Yes [provider]  loperamide (IMODIUM) 2 MG capsule Take 2 mg by mouth 3 (three) times daily as needed for diarrhea or loose stools.   Yes [provider]  loratadine (CLARITIN) 10 MG tablet Take 10 mg by mouth daily as needed for allergies.   Yes [provider]  Melatonin 3 MG TABS Take 3 mg by mouth at bedtime as needed (insomnia).   Yes [provider]  metoprolol tartrate (LOPRESSOR) 25 MG tablet Take 25 mg by mouth 2 (two) times daily.   Yes [provider]  ondansetron (ZOFRAN) 4 MG tablet Take 4 mg by mouth 2 (two) times daily as needed for nausea or vomiting.   Yes [provider]  oxyCODONE-acetaminophen (PERCOCET/ROXICET) 5-325 MG tablet Take 1 tablet by mouth 2 (two) times daily as needed for severe pain.   Yes [provider]  Probiotic Product (ALIGN) 4 MG CAPS Take 4 mg by mouth daily.   Yes [provider]  Probiotic Product (PROBIOTIC DAILY PO) Take 1 capsule by mouth daily.    Yes [provider]  rOPINIRole (REQUIP) 0.5 MG tablet Take 0.5 mg by mouth at bedtime.   Yes [provider]  traMADol (ULTRAM) 50 MG tablet Take 50 mg by mouth 3 (three) times daily as needed for moderate pain.   Yes [provider]  cephALEXin (KEFLEX) 500 MG capsule Take 1 capsule (500 mg total) by mouth 3 (three) times daily. Patient not taking: Reported on 04/19/2014 10/02/13   Davonna Belling, MD  cephALEXin (KEFLEX) 500  MG capsule Take 1 capsule (500 mg total) by mouth 2 (two) times daily. X 7 days Patient not taking: Reported on 02/05/2017 04/19/14   Street, [place], PA-C  doxycycline (VIBRAMYCIN) 100 MG capsule Take 1 capsule (100 mg total) by mouth 2 (two) times daily. Patient not taking: Reported on 02/05/2017 10/28/16   Deno Etienne, DO  levofloxacin (LEVAQUIN) 750 MG tablet Take 1 tablet (750 mg total) by mouth daily. X 7 days Patient not taking: Reported on 04/19/2014 12/21/13   Evelina Bucy, MD    Family History Family History  Problem Relation Age of Onset  . Pancreatic cancer Mother   . Testicular cancer Father   . Arthritis Brother   . Cancer Maternal Aunt        head/neck cancer    Social History Social History   Tobacco Use  . Smoking status: Former Smoker  Packs/day: 2.00    Years: 60.00    Pack years: 120.00    Types: Cigarettes    Last attempt to quit: 04/14/2008    Years since quitting: 8.8  . Smokeless tobacco: Never Used  Substance Use Topics  . Alcohol use: Yes    Alcohol/week: 4.4 oz    Types: 4 Glasses of wine, 4 Standard drinks or equivalent per week  . Drug use: No     Allergies   Fentanyl; Hydromorphone hcl; and Sulfonamide derivatives   Review of Systems Review of Systems   Physical Exam Updated Vital Signs BP (!) 163/102   Pulse 67   Resp 15   Wt 77.1 kg (170 lb)   SpO2 100%   BMI 23.06 kg/m   Physical Exam  Constitutional: He is oriented to person, place, and time. He appears well-developed and well-nourished. No distress.  HENT:  Head: Normocephalic and atraumatic.  Right Ear: External ear normal.  Left Ear: External ear normal.  Mm dry  Eyes: EOM are normal. Pupils are equal, round, and reactive to light.  Neck: Normal range of motion. Neck supple.  Cardiovascular: Normal rate and regular rhythm.  Pulmonary/Chest:  Rhonchi bilateral bases  Abdominal: Soft. Bowel sounds are normal.  Musculoskeletal: Normal range of motion.  Neurological: He  is alert and oriented to person, place, and time. He displays normal reflexes. No cranial nerve deficit. Coordination normal.  Skin: Skin is warm. Capillary refill takes less than 2 seconds.  Psychiatric: He has a normal mood and affect.  Nursing note and vitals reviewed.    ED Treatments / Results  Labs (all labs ordered are listed, but only abnormal results are displayed) Labs Reviewed  CBC - Abnormal; Notable for the following components:      Result Value   WBC 13.8 (*)    RBC 4.18 (*)    Hemoglobin 12.9 (*)    All other components within normal limits  BASIC METABOLIC PANEL  URINALYSIS, ROUTINE W REFLEX MICROSCOPIC  HEPATIC FUNCTION PANEL  LIPASE, BLOOD  CBG MONITORING, ED    EKG  EKG Interpretation  Date/Time:  Wednesday February 05 2017 10:43:01 EST Ventricular Rate:  85 PR Interval:    QRS Duration: 161 QT Interval:  436 QTC Calculation: 519 R Axis:   -43 Text Interpretation:  Sinus rhythm Left atrial enlargement RBBB and LAFB Confirmed by Pattricia Boss [phone]) on 02/05/2017 10:48:33 AM       Radiology Dg Chest Port 1 View  Result Date: 02/05/2017 CLINICAL DATA:  Cough and right-sided chest pain EXAM: PORTABLE CHEST 1 VIEW COMPARISON:  12/20/2013 FINDINGS: Cardiomegaly with generalized interstitial coarsening. There is a small left effusion which could obscure left lower lobe opacity. Calcified pleural plaques with calcification better seen on the left. These are bilateral based on a 2010 chest CT. Artifact from EKG leads. IMPRESSION: 1. No acute finding on the symptomatic right side. 2. Small left pleural effusion that is new from 2015. The fluid obscures portions of the left lower lobe. 3. Cardiomegaly and mild vascular congestion. 4. Asbestos related pleural disease. Electronically Signed   By: Monte Fantasia M.D.   On: 02/05/2017 12:38    Procedures Procedures (including critical care time)  Medications Ordered in ED Medications  sodium chloride 0.9 %  bolus 1,000 mL (1,000 mLs Intravenous New Bag/Given 02/05/17 1131)  promethazine (PHENERGAN) injection 6.25 mg (6.25 mg Intravenous Given 02/05/17 1130)     Initial Impression / Assessment and Plan / ED Course  I  have reviewed the triage vital signs and the nursing notes.  Pertinent labs & imaging results that were available during my care of the patient were reviewed by me and considered in my medical decision making (see chart for details).     84 y.o. Male with onset nausea  Vomiitng, leukocytosis, cxr with effusion ?obscuring infiltrate.  Rocephin and zithromax given here. Patient too weak to stand.  Plan admission for ongoing evaluation and treatment. Patient given iv fluids, continues with nausea and vomiting. Will add lactic and flu  Cussed with Dr. Elon Jester and he will see and admit patient Final Clinical Impressions(s) / ED Diagnoses   Final diagnoses:  Nausea and vomiting, intractability of vomiting not specified, unspecified vomiting type  Generalized weakness  Abnormal CXR    ED Discharge Orders    None       Pattricia Boss, MD 02/05/17 1630    Pattricia Boss, MD 02/05/17 1642

## 2017-02-05 NOTE — H&P (Signed)
History and Physical    Ricardo Allen WUJ:[id number] DOB: 12-25-1925  DOA: 02/05/2017 PCP: Velna Hatchet, MD  Patient coming from: ALF   Chief Complaint: Nausea, vomiting and weakness   HPI: Ricardo Allen is a 84 y.o. male with medical history significant of hypertension, depression and chronic back pain presented to the emergency department complaining of weakness, nausea and vomiting.  Patient reported after having breakfast felt that he was going to pass out and subsequently started with nonbilious nonbloody emesis.  Patient felt weak and he was sent from his assisted living facility to the emergency department for further evaluation.  Patient reports that he has been having left-sided chest pain for about 2 weeks with a nonproductive cough.  Patient denies short of breath, wheezing, palpitations and dizziness.  ED Course: Checks x-ray showed left lower lobe small effusion with possible opacity, WBC at 13.8.  Creatinine 1.66.  Elevated blood pressure up to 194/109.  Oxygen saturation above 95%.  [facility] consulted for admission.   Review of Systems:   General: Positive for generalized weakness.  Negative for fever and chills HEENT: no blurry vision, hearing changes or sore throat Respiratory:  See HPI CV: See HPI GI:  Positive for nausea, vomiting, diarrhea.  Negative for abdominal pain and constipation GU: no dysuria, burning on urination, increased urinary frequency, hematuria  Ext:. No deformities,  Neuro: no unilateral weakness, numbness, or tingling, no vision change or hearing loss Skin: No rashes, lesions or wounds. MSK: No muscle spasm, no deformity, no limitation of range of movement in spin Heme: No easy bruising.  Travel history: No recent long distant travel.   Past Medical History:  Diagnosis Date  . Anemia of other chronic disease 2008   EGD and colonoscopy in 2014 with diverticulosis only  . Atrial fibrillation ([place]) 11/27/2009  . Complex sleep apnea syndrome  08/19/2006       . DEPRESSION 08/27/2006  . DISORDER, LUMBAR DISC W/MYELOPATHY 08/19/2006  . Diverticulosis   . GOUT 03/22/2009  . HYPERLIPIDEMIA 08/27/2006  . HYPOTENSION, ORTHOSTATIC 10/31/2009  . Impaired fasting glucose 01/27/2007  . LOW BACK PAIN 02/03/2007   Lumbar radiculopathy 09/2006  . LUNG NODULE 03/29/2008  . MELANOMA, MALIGNANT, TRUNK 08/19/2006   early stage; just exicion.   . OBSTRUCTIVE SLEEP APNEA 08/19/2006  . TOBACCO ABUSE 12/31/2007  . TRAUMATIC ARTHROPATHY PELVIC REGION AND THIGH 06/29/2009  . Wet senile macular degeneration [facility])     Past Surgical History:  Procedure Laterality Date  . CATARACT EXTRACTION Bilateral   . COLONOSCOPY N/A 06/21/2012   Procedure: COLONOSCOPY;  Surgeon: Inda Castle, MD;  Location: [facility] ENDOSCOPY;  Service: Endoscopy;  Laterality: N/A;  . ESOPHAGOGASTRODUODENOSCOPY N/A 06/20/2012   Procedure: ESOPHAGOGASTRODUODENOSCOPY (EGD);  Surgeon: Inda Castle, MD;  Location: [facility];  Service: Endoscopy;  Laterality: N/A;  . LUMBAR FUSION    . LUMBAR LAMINECTOMY    . MELANOMA EXCISION     left lower back  . TONSILECTOMY, ADENOIDECTOMY, BILATERAL MYRINGOTOMY AND TUBES       reports that he quit smoking about 8 years ago. His smoking use included cigarettes. He has a 120.00 pack-year smoking history. he has never used smokeless tobacco. He reports that he drinks about 4.4 oz of alcohol per week. He reports that he does not use drugs.  Allergies  Allergen Reactions  . Fentanyl Nausea Only and Other (See Comments)    REACTION: nausea and depression  . Hydromorphone Hcl Nausea Only and Other (See Comments)  REACTION: depressed and nausea  . Sulfonamide Derivatives Hives    REACTION: hives    Family History  Problem Relation Age of Onset  . Pancreatic cancer Mother   . Testicular cancer Father   . Arthritis Brother   . Cancer Maternal Aunt        head/neck cancer   Family history reviewed and nonpertinent  Prior to Admission medications     Medication Sig Start Date End Date Taking? Authorizing Provider  acetaminophen (TYLENOL) 650 MG CR tablet Take 650 mg by mouth 2 (two) times daily.   Yes [provider]  allopurinol (ZYLOPRIM) 300 MG tablet Take 300 mg by mouth daily.   Yes [provider]  benzonatate (TESSALON) 100 MG capsule Take 100 mg by mouth every 8 (eight) hours as needed for cough.   Yes [provider]  buPROPion (WELLBUTRIN XL) 150 MG 24 hr tablet Take 150 mg by mouth daily. 03/25/14  Yes [provider]  busPIRone (BUSPAR) 10 MG tablet Take 10 mg by mouth 2 (two) times daily.   Yes [provider]  Cholecalciferol (VITAMIN D3) 2000 units TABS Take 2,000 Units by mouth daily.   Yes [provider]  ciclopirox (PENLAC) 8 % solution Apply 1 application topically at bedtime. Apply over nail and surrounding skin. Apply daily over previous coat. After seven (7) days, may remove with alcohol and continue cycle.   Yes [provider]  citalopram (CELEXA) 20 MG tablet Take 20 mg by mouth daily.   Yes [provider]  colchicine 0.6 MG tablet Take 0.6 mg by mouth daily as needed (for gout).   Yes [provider]  fluticasone (FLONASE) 50 MCG/ACT nasal spray Place 1 spray into both nostrils daily.   Yes [provider]  ibuprofen (ADVIL,MOTRIN) 600 MG tablet Take 600 mg by mouth 2 (two) times daily as needed for moderate pain.   Yes [provider]  loperamide (IMODIUM) 2 MG capsule Take 2 mg by mouth 3 (three) times daily as needed for diarrhea or loose stools.   Yes [provider]  loratadine (CLARITIN) 10 MG tablet Take 10 mg by mouth daily as needed for allergies.   Yes [provider]  Melatonin 3 MG TABS Take 3 mg by mouth at bedtime as needed (insomnia).   Yes [provider]  metoprolol tartrate (LOPRESSOR) 25 MG tablet Take 25 mg by mouth 2 (two) times daily.   Yes [provider]   ondansetron (ZOFRAN) 4 MG tablet Take 4 mg by mouth 2 (two) times daily as needed for nausea or vomiting.   Yes [provider]  oxyCODONE-acetaminophen (PERCOCET/ROXICET) 5-325 MG tablet Take 1 tablet by mouth 2 (two) times daily as needed for severe pain.   Yes [provider]  Probiotic Product (ALIGN) 4 MG CAPS Take 4 mg by mouth daily.   Yes [provider]  Probiotic Product (PROBIOTIC DAILY PO) Take 1 capsule by mouth daily.    Yes [provider]  rOPINIRole (REQUIP) 0.5 MG tablet Take 0.5 mg by mouth at bedtime.   Yes [provider]  traMADol (ULTRAM) 50 MG tablet Take 50 mg by mouth 3 (three) times daily as needed for moderate pain.   Yes [provider]  cephALEXin (KEFLEX) 500 MG capsule Take 1 capsule (500 mg total) by mouth 3 (three) times daily. Patient not taking: Reported on 04/19/2014 10/02/13   Davonna Belling, MD  cephALEXin (KEFLEX) 500 MG capsule Take 1  capsule (500 mg total) by mouth 2 (two) times daily. X 7 days Patient not taking: Reported on 02/05/2017 04/19/14   Street, [place], PA-C  doxycycline (VIBRAMYCIN) 100 MG capsule Take 1 capsule (100 mg total) by mouth 2 (two) times daily. Patient not taking: Reported on 02/05/2017 10/28/16   Deno Etienne, DO  levofloxacin (LEVAQUIN) 750 MG tablet Take 1 tablet (750 mg total) by mouth daily. X 7 days Patient not taking: Reported on 04/19/2014 12/21/13   Evelina Bucy, MD    Physical Exam: Vitals:   02/05/17 1320 02/05/17 1330 02/05/17 1700 02/05/17 1715  BP:  (!) 175/97 (!) 182/95   Pulse:  89  90  Resp:  20  13  Temp: (!) 97.3 F (36.3 C)     TempSrc: Rectal     SpO2:  96%  97%  Weight:         Constitutional: NAD, calm, comfortable Eyes: PERRL, lids and conjunctivae normal ENMT: Mucous membranes are moist. Posterior pharynx clear of any exudate or lesions. Hard hearing Neck: normal, supple, no masses, no thyromegaly Respiratory: Decreased breath sounds at the left  lower lobe, lLLL + rales, no wheezing or crackles Cardiovascular: Regular rate and rhythm, no murmurs / rubs / gallops. No extremity edema. 2+ pedal pulses. No carotid bruits.  Abdomen: Abdomen soft, non-tender, mild distention, positive bowel movements Musculoskeletal: no clubbing / cyanosis. No joint deformity upper and lower extremities.  Skin: no rashes, lesions, ulcers. No induration Neurologic: CN 2-12 grossly intact. Sensation intact Psychiatric: Normal judgment and insight. Alert and oriented x 3. Normal mood.   Labs on Admission: I have personally reviewed following labs and imaging studies  CBC: Recent Labs  Lab 02/05/17 1120  WBC 13.8*  HGB 12.9*  HCT 39.5  MCV 94.5  PLT 564   Basic Metabolic Panel: Recent Labs  Lab 02/05/17 1120  NA 139  K 4.6  CL 105  CO2 22  GLUCOSE 152*  BUN 21*  CREATININE 1.66*  CALCIUM 9.0   GFR: Estimated Creatinine Clearance: 32.9 mL/min (A) (by C-G formula based on SCr of 1.66 mg/dL (H)). Liver Function Tests: Recent Labs  Lab 02/05/17 1120  AST 27  ALT 13*  ALKPHOS 131*  BILITOT 0.7  PROT 7.1  ALBUMIN 3.4*   Recent Labs  Lab 02/05/17 1120  LIPASE 32   No results for input(s): AMMONIA in the last 168 hours. Coagulation Profile: No results for input(s): INR, PROTIME in the last 168 hours. Cardiac Enzymes: No results for input(s): CKTOTAL, CKMB, CKMBINDEX, TROPONINI in the last 168 hours. BNP (last 3 results) No results for input(s): PROBNP in the last 8760 hours. HbA1C: No results for input(s): HGBA1C in the last 72 hours. CBG: Recent Labs  Lab 02/05/17 1200  GLUCAP 115*   Lipid Profile: No results for input(s): CHOL, HDL, LDLCALC, TRIG, CHOLHDL, LDLDIRECT in the last 72 hours. Thyroid Function Tests: No results for input(s): TSH, T4TOTAL, FREET4, T3FREE, THYROIDAB in the last 72 hours. Anemia Panel: No results for input(s): VITAMINB12, FOLATE, FERRITIN, TIBC, IRON, RETICCTPCT in the last 72 hours. Urine  analysis:    Component Value Date/Time   COLORURINE AMBER (A) 02/05/2017 1320   APPEARANCEUR CLOUDY (A) 02/05/2017 1320   LABSPEC 1.011 02/05/2017 1320   PHURINE 7.0 02/05/2017 1320   GLUCOSEU NEGATIVE 02/05/2017 1320   HGBUR NEGATIVE 02/05/2017 1320   BILIRUBINUR NEGATIVE 02/05/2017 1320   KETONESUR NEGATIVE 02/05/2017 1320   PROTEINUR NEGATIVE 02/05/2017 1320   UROBILINOGEN 0.2 04/19/2014 1244  NITRITE NEGATIVE 02/05/2017 1320   LEUKOCYTESUR NEGATIVE 02/05/2017 1320   Sepsis Labs: !!!!!!!!!!!!!!!!!!!!!!!!!!!!!!!!!!!!!!!!!!!! @LABRCNTIP (procalcitonin:4,lacticidven:4) )No results found for this or any previous visit (from the past 240 hour(s)).   Radiological Exams on Admission: Dg Chest Port 1 View  Result Date: 02/05/2017 CLINICAL DATA:  Cough and right-sided chest pain EXAM: PORTABLE CHEST 1 VIEW COMPARISON:  12/20/2013 FINDINGS: Cardiomegaly with generalized interstitial coarsening. There is a small left effusion which could obscure left lower lobe opacity. Calcified pleural plaques with calcification better seen on the left. These are bilateral based on a 2010 chest CT. Artifact from EKG leads. IMPRESSION: 1. No acute finding on the symptomatic right side. 2. Small left pleural effusion that is new from 2015. The fluid obscures portions of the left lower lobe. 3. Cardiomegaly and mild vascular congestion. 4. Asbestos related pleural disease. Electronically Signed   By: Monte Fantasia M.D.   On: 02/05/2017 12:38    EKG: Independently reviewed.  Normal sinus rhythm, right bundle branch block  Assessment/Plan Generalized weakness, concern for community-acquired pneumonia Admit to med surg  Chest x-ray concerning LLL underlying pneumonia, small pleural effusion may be obscuring findings  History of left-sided chest pain with nonproductive cough and elevated WBC Agree with Rocephin and azithromycin will continue for now Repeat chest x-ray PA and lateral Check  pro-calcitonin Check Flu and RP PCR PT eval   Nausea and vomiting Unclear etiology, prob from acute illness IVF, Zofran. Full liquids as patient would like to try some food tonight   HTN  Continue metoprolol  BP elevated  Hydralazine PRN for SBP > 170 Monitor BP   CKD stage III  Last Cr on record 1.84on 10/28/16, this was an ER visit, so unclear real baseline  Currently at 1.66 Will continue gentle hydration and monitor Cr   Depression and Anxiety  Resume home meds  Chronic back pain  Continue home Oxy and Tylenol   Chronic Gout  Resume Colchicine and allopurinol    DVT prophylaxis: Heparin Sq Code Status: Full  Family Communication: None at bedside  Disposition Plan: Anticipate discharge to previous home environment.  Consults called: None  Admission status: Inpatient Medsurg    Chipper Oman MD [facility] Pager: Text Page via [email or web address]  [phone]  If 7PM-7AM, please contact night-coverage [email or web address] Password [facility]  02/05/2017, 6:25 PM

## 2017-02-05 NOTE — Progress Notes (Signed)
Received report from ER RN. Shayla Heming, Wonda Cheng, Therapist, sports

## 2017-02-05 NOTE — ED Notes (Signed)
Pt stated vomiting when I came in room to do EKG

## 2017-02-05 NOTE — ED Notes (Signed)
Holly, EMT ordered full liquid dinner tray.

## 2017-02-05 NOTE — ED Triage Notes (Signed)
Pt in from [facility] with near syncope episode, followed by n/v. Per staff, pt was sitting in dining room eating and got up to go to restroom and suddenly felt lightheaded and diaphoretic. Staff said BP was 90/60. Pt then began vomiting x4 now. Given 4mg  Zofran IV en route. States some pain in L rib cage, r/t fall 2 weeks ago. BP 200/100 for EMS

## 2017-02-06 ENCOUNTER — Inpatient Hospital Stay (HOSPITAL_COMMUNITY): Payer: Medicare Other

## 2017-02-06 ENCOUNTER — Other Ambulatory Visit: Payer: Self-pay

## 2017-02-06 DIAGNOSIS — J13 Pneumonia due to Streptococcus pneumoniae: Secondary | ICD-10-CM

## 2017-02-06 DIAGNOSIS — J189 Pneumonia, unspecified organism: Secondary | ICD-10-CM | POA: Diagnosis present

## 2017-02-06 LAB — COMPREHENSIVE METABOLIC PANEL
ALT: 11 U/L — ABNORMAL LOW (ref 17–63)
AST: 17 U/L (ref 15–41)
Albumin: 2.7 g/dL — ABNORMAL LOW (ref 3.5–5.0)
Alkaline Phosphatase: 102 U/L (ref 38–126)
Anion gap: 10 (ref 5–15)
BILIRUBIN TOTAL: 0.6 mg/dL (ref 0.3–1.2)
BUN: 15 mg/dL (ref 6–20)
CO2: 20 mmol/L — ABNORMAL LOW (ref 22–32)
Calcium: 7.9 mg/dL — ABNORMAL LOW (ref 8.9–10.3)
Chloride: 109 mmol/L (ref 101–111)
Creatinine, Ser: 1.27 mg/dL — ABNORMAL HIGH (ref 0.61–1.24)
GFR calc Af Amer: 56 mL/min — ABNORMAL LOW (ref >60)
GFR, EST NON AFRICAN AMERICAN: 48 mL/min — AB (ref >60)
Glucose, Bld: 76 mg/dL (ref 65–99)
POTASSIUM: 3.9 mmol/L (ref 3.5–5.1)
Sodium: 139 mmol/L (ref 135–145)
TOTAL PROTEIN: 5.7 g/dL — AB (ref 6.5–8.1)

## 2017-02-06 LAB — CBC
HCT: 35.4 % — ABNORMAL LOW (ref 39.0–52.0)
Hemoglobin: 11.1 g/dL — ABNORMAL LOW (ref 13.0–17.0)
MCH: 29.9 pg (ref 26.0–34.0)
MCHC: 31.4 g/dL (ref 30.0–36.0)
MCV: 95.4 fL (ref 78.0–100.0)
PLATELETS: 263 10*3/uL (ref 150–400)
RBC: 3.71 MIL/uL — ABNORMAL LOW (ref 4.22–5.81)
RDW: 14.9 % (ref 11.5–15.5)
WBC: 10.3 10*3/uL (ref 4.0–10.5)

## 2017-02-06 LAB — RESPIRATORY PANEL BY PCR
ADENOVIRUS-RVPPCR: NOT DETECTED
BORDETELLA PERTUSSIS-RVPCR: NOT DETECTED
CHLAMYDOPHILA PNEUMONIAE-RVPPCR: NOT DETECTED
CORONAVIRUS 229E-RVPPCR: NOT DETECTED
CORONAVIRUS HKU1-RVPPCR: NOT DETECTED
Coronavirus NL63: NOT DETECTED
Coronavirus OC43: NOT DETECTED
Influenza A: NOT DETECTED
Influenza B: NOT DETECTED
METAPNEUMOVIRUS-RVPPCR: NOT DETECTED
Mycoplasma pneumoniae: NOT DETECTED
PARAINFLUENZA VIRUS 2-RVPPCR: NOT DETECTED
Parainfluenza Virus 1: NOT DETECTED
Parainfluenza Virus 3: NOT DETECTED
Parainfluenza Virus 4: NOT DETECTED
Respiratory Syncytial Virus: DETECTED — AB
Rhinovirus / Enterovirus: NOT DETECTED

## 2017-02-06 LAB — MRSA PCR SCREENING: MRSA by PCR: POSITIVE — AB

## 2017-02-06 MED ORDER — AZITHROMYCIN 500 MG PO TABS: 500.0000 mg | ORAL_TABLET | Freq: Every day | ORAL | 0 refills | Status: AC

## 2017-02-06 MED ORDER — MUPIROCIN 2 % EX OINT
1.0000 "application " | TOPICAL_OINTMENT | Freq: Two times a day (BID) | CUTANEOUS | Status: DC
  Administered 2017-02-06 (×2): 1 via NASAL
  Filled 2017-02-06: qty 22

## 2017-02-06 MED ORDER — CHLORHEXIDINE GLUCONATE CLOTH 2 % EX PADS
6.0000 | MEDICATED_PAD | Freq: Every day | CUTANEOUS | Status: DC
  Administered 2017-02-06: 6 via TOPICAL

## 2017-02-06 MED ORDER — ENSURE ENLIVE PO LIQD
237.0000 mL | Freq: Two times a day (BID) | ORAL | Status: DC
  Administered 2017-02-06: 237 mL via ORAL

## 2017-02-06 MED ORDER — CEFDINIR 300 MG PO CAPS: 300.0000 mg | ORAL_CAPSULE | Freq: Two times a day (BID) | ORAL | 0 refills | Status: AC

## 2017-02-06 NOTE — Discharge Instructions (Signed)

## 2017-02-06 NOTE — Progress Notes (Signed)
Nutrition Brief Note  Patient identified on the Malnutrition Screening Tool (MST) Report  Wt Readings from Last 15 Encounters:  02/05/17 174 lb 13.2 oz (79.3 kg)  10/28/16 170 lb (77.1 kg)  12/20/13 170 lb (77.1 kg)  08/25/13 179 lb 8 oz (81.4 kg)  08/04/13 176 lb 4 oz (79.9 kg)  05/25/13 170 lb 3.2 oz (77.2 kg)  04/26/13 173 lb (78.5 kg)  04/06/13 169 lb 15.6 oz (77.1 kg)  03/29/13 173 lb (78.5 kg)  03/28/13 174 lb (78.9 kg)  01/22/13 176 lb 11.2 oz (80.2 kg)  09/03/12 173 lb (78.5 kg)  08/03/12 175 lb 12.8 oz (79.7 kg)  07/22/12 170 lb 9.6 oz (77.4 kg)  06/25/12 165 lb (74.8 kg)    Body mass index is 24.38 kg/m.  Pt reports no changes in weight, this is reflected in last 15 weight encounters.   Pt reports N/V for 1 day PTA. Pt reports good appetite, eating well prior to this. Pt reports he sometimes supplements diet with Ensure, requesting Ensure this admission. Order for Ensure Enlive BID placed.  Current diet order is FL, patient tolerating well without N/V. Labs and medications reviewed.   No nutrition interventions warranted at this time. If nutrition issues arise, please consult RD.   Kerman Passey MS, RD, [place], [place] [phone] Pager  [phone] Weekend/On-Call Pager

## 2017-02-06 NOTE — Care Management CC44 (Signed)
Condition Code 44 Documentation Completed  Patient Details  Name: Ricardo Allen MRN: [id number] Date of Birth: June 28, 1925   Condition Code 44 given:  Yes Patient signature on Condition Code 44 notice:  Yes Documentation of 2 MD's agreement:  Yes Code 44 added to claim:  Yes    Kristen Cardinal, RN 02/06/2017, 2:46 PM

## 2017-02-06 NOTE — Progress Notes (Signed)
Discharge instructions discussed and reviewed with pt, verbalized understanding. Copy of AVS given as well. Pt was escorted out of the unit in wheelchair, took all belongings with him.  

## 2017-02-06 NOTE — Discharge Summary (Signed)
Physician Discharge Summary  Ricardo Allen CNO:[id number] DOB: 1925/12/07 DOA: 02/05/2017  PCP: Velna Hatchet, MD  Admit date: 02/05/2017 Discharge date: 02/06/2017  Admitted From: Home  Disposition:  Home   Recommendations for Outpatient Follow-up:  1. Follow up with PCP in 1-2 weeks   Home Health: No  Equipment/Devices: No  Discharge Condition: stable  CODE STATUS: FULL  Diet recommendation: Heart Healthy  Brief/Interim Summary:  ##) CAP d/t S.PNA: Pt was admitted for CAP and inability to walk. Pt was given IV ceftriaxone and azithromycin. Rapid swab came back positive for RSV. Pt was d/c home on cefdinir and azithromycin. Pt was given PRN albuterol however he was not wheezing.   ##) Generalized weakness: This resolved after IVF and IV antibiotics.   ##) Gout: Pt was continued on home allopurinol and colchicine.  ##) Psych: Pt was continued on home citalopram, buproprio and buspirone.   Discharge Diagnoses:  Active Problems:   Pneumonia due to Streptococcus pneumoniae [facility])    Discharge Instructions Please follow up with your PCP  Allergies as of 02/06/2017      Reactions   Fentanyl Nausea Only, Other (See Comments)   REACTION: nausea and depression   Hydromorphone Hcl Nausea Only, Other (See Comments)   REACTION: depressed and nausea   Sulfonamide Derivatives Hives   REACTION: hives      Medication List    TAKE these medications   acetaminophen 650 MG CR tablet Commonly known as:  TYLENOL Take 650 mg by mouth 2 (two) times daily.   allopurinol 300 MG tablet Commonly known as:  ZYLOPRIM Take 300 mg by mouth daily.   azithromycin 500 MG tablet Commonly known as:  ZITHROMAX Take 1 tablet (500 mg total) by mouth daily. Start taking on:  02/07/2017   benzonatate 100 MG capsule Commonly known as:  TESSALON Take 100 mg by mouth every 8 (eight) hours as needed for cough.   buPROPion 150 MG 24 hr tablet Commonly known as:  WELLBUTRIN XL Take 150 mg by  mouth daily.   busPIRone 10 MG tablet Commonly known as:  BUSPAR Take 10 mg by mouth 2 (two) times daily.   cefdinir 300 MG capsule Commonly known as:  OMNICEF Take 1 capsule (300 mg total) by mouth 2 (two) times daily for 5 days.   ciclopirox 8 % solution Commonly known as:  PENLAC Apply 1 application topically at bedtime. Apply over nail and surrounding skin. Apply daily over previous coat. After seven (7) days, may remove with alcohol and continue cycle.   citalopram 20 MG tablet Commonly known as:  CELEXA Take 20 mg by mouth daily.   colchicine 0.6 MG tablet Take 0.6 mg by mouth daily as needed (for gout).   fluticasone 50 MCG/ACT nasal spray Commonly known as:  FLONASE Place 1 spray into both nostrils daily.   ibuprofen 600 MG tablet Commonly known as:  ADVIL,MOTRIN Take 600 mg by mouth 2 (two) times daily as needed for moderate pain.   loperamide 2 MG capsule Commonly known as:  IMODIUM Take 2 mg by mouth 3 (three) times daily as needed for diarrhea or loose stools.   loratadine 10 MG tablet Commonly known as:  CLARITIN Take 10 mg by mouth daily as needed for allergies.   Melatonin 3 MG Tabs Take 3 mg by mouth at bedtime as needed (insomnia).   metoprolol tartrate 25 MG tablet Commonly known as:  LOPRESSOR Take 25 mg by mouth 2 (two) times daily.   ondansetron 4  MG tablet Commonly known as:  ZOFRAN Take 4 mg by mouth 2 (two) times daily as needed for nausea or vomiting.   oxyCODONE-acetaminophen 5-325 MG tablet Commonly known as:  PERCOCET/ROXICET Take 1 tablet by mouth 2 (two) times daily as needed for severe pain.   ALIGN 4 MG Caps Take 4 mg by mouth daily.   PROBIOTIC DAILY PO Take 1 capsule by mouth daily.   rOPINIRole 0.5 MG tablet Commonly known as:  REQUIP Take 0.5 mg by mouth at bedtime.   traMADol 50 MG tablet Commonly known as:  ULTRAM Take 50 mg by mouth 3 (three) times daily as needed for moderate pain.   Vitamin D3 2000 units  Tabs Take 2,000 Units by mouth daily.      Follow-up Information    Velna Hatchet, MD. Schedule an appointment as soon as possible for a visit in 1 week(s).   Specialty:  Internal Medicine Contact information: [place] [id number] [phone]          Allergies  Allergen Reactions  . Fentanyl Nausea Only and Other (See Comments)    REACTION: nausea and depression  . Hydromorphone Hcl Nausea Only and Other (See Comments)    REACTION: depressed and nausea  . Sulfonamide Derivatives Hives    REACTION: hives     Procedures/Studies: X-ray Chest Pa And Lateral  Result Date: 02/06/2017 CLINICAL DATA:  Pneumonia. EXAM: CHEST  2 VIEW COMPARISON:  02/05/2017.  03/25/2008.  CT 03/25/2008. FINDINGS: Cardiomegaly with pulmonary venous congestion. Persistent left base infiltrate and left-sided pleural effusion. No interim change. Calcified pleural plaques consistent prior asbestos exposure again noted. No pneumothorax. Calcified pleural plaques again noted on the left consistent with prior asbestos exposure. IMPRESSION: 1. Cardiomegaly with pulmonary venous congestion noted on today's exam. 2. Persistent left base infiltrate and left-sided pleural effusion. No interim change. 3. Left-sided calcified pleural plaques again noted and are consistent with prior asbestos exposure. Electronically Signed   By: Marcello Moores  Register   On: 02/06/2017 08:35   Dg Chest Port 1 View  Result Date: 02/05/2017 CLINICAL DATA:  Cough and right-sided chest pain EXAM: PORTABLE CHEST 1 VIEW COMPARISON:  12/20/2013 FINDINGS: Cardiomegaly with generalized interstitial coarsening. There is a small left effusion which could obscure left lower lobe opacity. Calcified pleural plaques with calcification better seen on the left. These are bilateral based on a 2010 chest CT. Artifact from EKG leads. IMPRESSION: 1. No acute finding on the symptomatic right side. 2. Small left pleural effusion that is new from  2015. The fluid obscures portions of the left lower lobe. 3. Cardiomegaly and mild vascular congestion. 4. Asbestos related pleural disease. Electronically Signed   By: Monte Fantasia M.D.   On: 02/05/2017 12:38   (Echo, Carotid, EGD, Colonoscopy, ERCP)    Subjective:   Discharge Exam: Vitals:   02/06/17 0439 02/06/17 0940  BP: 138/73 122/74  Pulse: 83 81  Resp:  18  Temp: 98.5 F (36.9 C) 98.4 F (36.9 C)  SpO2: 96% 95%   Vitals:   02/05/17 1715 02/05/17 2037 02/06/17 0439 02/06/17 0940  BP:  (!) 172/89 138/73 122/74  Pulse: 90 87 83 81  Resp: 13   18  Temp:  97.8 F (36.6 C) 98.5 F (36.9 C) 98.4 F (36.9 C)  TempSrc:  Oral Oral Oral  SpO2: 97% 99% 96% 95%  Weight:  79.3 kg (174 lb 13.2 oz)    Height:  5\' 11"  (1.803 m)  General: Pt is alert, awake, not in acute distress Cardiovascular: RRR, S1/S2 +, no rubs, no gallops Respiratory: CTA bilaterally, no wheezing, no rhonchi Abdominal: Soft, NT, ND, bowel sounds + Extremities: no edema, no cyanosis    The results of significant diagnostics from this hospitalization (including imaging, microbiology, ancillary and laboratory) are listed below for reference.     Microbiology: Recent Results (from the past 240 hour(s))  Respiratory Panel by PCR     Status: Abnormal   Collection Time: 02/05/17  5:44 PM  Result Value Ref Range Status   Adenovirus NOT DETECTED NOT DETECTED Final   Coronavirus 229E NOT DETECTED NOT DETECTED Final   Coronavirus HKU1 NOT DETECTED NOT DETECTED Final   Coronavirus NL63 NOT DETECTED NOT DETECTED Final   Coronavirus OC43 NOT DETECTED NOT DETECTED Final   Metapneumovirus NOT DETECTED NOT DETECTED Final   Rhinovirus / Enterovirus NOT DETECTED NOT DETECTED Final   Influenza A NOT DETECTED NOT DETECTED Final   Influenza B NOT DETECTED NOT DETECTED Final   Parainfluenza Virus 1 NOT DETECTED NOT DETECTED Final   Parainfluenza Virus 2 NOT DETECTED NOT DETECTED Final   Parainfluenza Virus 3  NOT DETECTED NOT DETECTED Final   Parainfluenza Virus 4 NOT DETECTED NOT DETECTED Final   Respiratory Syncytial Virus DETECTED (A) NOT DETECTED Final    Comment: CRITICAL RESULT CALLED TO, READ BACK BY AND VERIFIED WITHBarnabas Harries RN [phone] 02/06/17 A BROWNING    Bordetella pertussis NOT DETECTED NOT DETECTED Final   Chlamydophila pneumoniae NOT DETECTED NOT DETECTED Final   Mycoplasma pneumoniae NOT DETECTED NOT DETECTED Final  MRSA PCR Screening     Status: Abnormal   Collection Time: 02/05/17 10:13 PM  Result Value Ref Range Status   MRSA by PCR POSITIVE (A) NEGATIVE Final    Comment:        The GeneXpert MRSA Assay (FDA approved for NASAL specimens only), is one component of a comprehensive MRSA colonization surveillance program. It is not intended to diagnose MRSA infection nor to guide or monitor treatment for MRSA infections. RESULT CALLED TO, READ BACK BY AND VERIFIED WITH: CBoyce Medici RN 02/06/17 0314 JDW      Labs: BNP (last 3 results) No results for input(s): BNP in the last 8760 hours. Basic Metabolic Panel: Recent Labs  Lab 02/05/17 1120 02/06/17 0710  NA 139 139  K 4.6 3.9  CL 105 109  CO2 22 20*  GLUCOSE 152* 76  BUN 21* 15  CREATININE 1.66* 1.27*  CALCIUM 9.0 7.9*   Liver Function Tests: Recent Labs  Lab 02/05/17 1120 02/06/17 0710  AST 27 17  ALT 13* 11*  ALKPHOS 131* 102  BILITOT 0.7 0.6  PROT 7.1 5.7*  ALBUMIN 3.4* 2.7*   Recent Labs  Lab 02/05/17 1120  LIPASE 32   No results for input(s): AMMONIA in the last 168 hours. CBC: Recent Labs  Lab 02/05/17 1120 02/06/17 0710  WBC 13.8* 10.3  HGB 12.9* 11.1*  HCT 39.5 35.4*  MCV 94.5 95.4  PLT 286 263   Cardiac Enzymes: No results for input(s): CKTOTAL, CKMB, CKMBINDEX, TROPONINI in the last 168 hours. BNP: Invalid input(s): POCBNP CBG: Recent Labs  Lab 02/05/17 1200  GLUCAP 115*   D-Dimer No results for input(s): DDIMER in the last 72 hours. Hgb A1c No results for  input(s): HGBA1C in the last 72 hours. Lipid Profile No results for input(s): CHOL, HDL, LDLCALC, TRIG, CHOLHDL, LDLDIRECT in the last 72 hours. Thyroid function studies No  results for input(s): TSH, T4TOTAL, T3FREE, THYROIDAB in the last 72 hours.  Invalid input(s): FREET3 Anemia work up No results for input(s): VITAMINB12, FOLATE, FERRITIN, TIBC, IRON, RETICCTPCT in the last 72 hours. Urinalysis    Component Value Date/Time   COLORURINE AMBER (A) 02/05/2017 1320   APPEARANCEUR CLOUDY (A) 02/05/2017 1320   LABSPEC 1.011 02/05/2017 1320   PHURINE 7.0 02/05/2017 1320   GLUCOSEU NEGATIVE 02/05/2017 1320   HGBUR NEGATIVE 02/05/2017 1320   BILIRUBINUR NEGATIVE 02/05/2017 1320   KETONESUR NEGATIVE 02/05/2017 1320   PROTEINUR NEGATIVE 02/05/2017 1320   UROBILINOGEN 0.2 04/19/2014 1244   NITRITE NEGATIVE 02/05/2017 1320   LEUKOCYTESUR NEGATIVE 02/05/2017 1320   Sepsis Labs Invalid input(s): PROCALCITONIN,  WBC,  LACTICIDVEN Microbiology Recent Results (from the past 240 hour(s))  Respiratory Panel by PCR     Status: Abnormal   Collection Time: 02/05/17  5:44 PM  Result Value Ref Range Status   Adenovirus NOT DETECTED NOT DETECTED Final   Coronavirus 229E NOT DETECTED NOT DETECTED Final   Coronavirus HKU1 NOT DETECTED NOT DETECTED Final   Coronavirus NL63 NOT DETECTED NOT DETECTED Final   Coronavirus OC43 NOT DETECTED NOT DETECTED Final   Metapneumovirus NOT DETECTED NOT DETECTED Final   Rhinovirus / Enterovirus NOT DETECTED NOT DETECTED Final   Influenza A NOT DETECTED NOT DETECTED Final   Influenza B NOT DETECTED NOT DETECTED Final   Parainfluenza Virus 1 NOT DETECTED NOT DETECTED Final   Parainfluenza Virus 2 NOT DETECTED NOT DETECTED Final   Parainfluenza Virus 3 NOT DETECTED NOT DETECTED Final   Parainfluenza Virus 4 NOT DETECTED NOT DETECTED Final   Respiratory Syncytial Virus DETECTED (A) NOT DETECTED Final    Comment: CRITICAL RESULT CALLED TO, READ BACK BY AND VERIFIED  WITHBarnabas Harries RN [phone] 02/06/17 A BROWNING    Bordetella pertussis NOT DETECTED NOT DETECTED Final   Chlamydophila pneumoniae NOT DETECTED NOT DETECTED Final   Mycoplasma pneumoniae NOT DETECTED NOT DETECTED Final  MRSA PCR Screening     Status: Abnormal   Collection Time: 02/05/17 10:13 PM  Result Value Ref Range Status   MRSA by PCR POSITIVE (A) NEGATIVE Final    Comment:        The GeneXpert MRSA Assay (FDA approved for NASAL specimens only), is one component of a comprehensive MRSA colonization surveillance program. It is not intended to diagnose MRSA infection nor to guide or monitor treatment for MRSA infections. RESULT CALLED TO, READ BACK BY AND VERIFIED WITHHarriet Masson RN 02/06/17 0314 JDW      Time coordinating discharge: Over 30 minutes  SIGNED:   Cristy Folks, MD  [facility] 02/06/2017, 1:31 PM Pager [phone]

## 2017-02-26 ENCOUNTER — Other Ambulatory Visit: Payer: Self-pay | Admitting: Internal Medicine

## 2017-02-26 DIAGNOSIS — R9389 Abnormal findings on diagnostic imaging of other specified body structures: Secondary | ICD-10-CM

## 2017-03-04 ENCOUNTER — Encounter (INDEPENDENT_AMBULATORY_CARE_PROVIDER_SITE_OTHER): Payer: Medicare Other | Admitting: Ophthalmology

## 2017-03-04 DIAGNOSIS — H353231 Exudative age-related macular degeneration, bilateral, with active choroidal neovascularization: Secondary | ICD-10-CM | POA: Diagnosis not present

## 2017-03-04 DIAGNOSIS — H43813 Vitreous degeneration, bilateral: Secondary | ICD-10-CM

## 2017-04-01 ENCOUNTER — Encounter (INDEPENDENT_AMBULATORY_CARE_PROVIDER_SITE_OTHER): Payer: Medicare Other | Admitting: Ophthalmology

## 2017-04-01 DIAGNOSIS — H353231 Exudative age-related macular degeneration, bilateral, with active choroidal neovascularization: Secondary | ICD-10-CM | POA: Diagnosis not present

## 2017-04-01 DIAGNOSIS — H43813 Vitreous degeneration, bilateral: Secondary | ICD-10-CM

## 2017-04-21 ENCOUNTER — Other Ambulatory Visit: Payer: Self-pay

## 2017-04-21 ENCOUNTER — Emergency Department (HOSPITAL_COMMUNITY): Payer: Medicare Other

## 2017-04-21 ENCOUNTER — Encounter (HOSPITAL_COMMUNITY): Payer: Self-pay

## 2017-04-21 ENCOUNTER — Inpatient Hospital Stay (HOSPITAL_COMMUNITY)
Admission: EM | Admit: 2017-04-21 | Discharge: 2017-05-14 | DRG: 193 | Disposition: E | Payer: Medicare Other | Attending: Nephrology | Admitting: Nephrology

## 2017-04-21 DIAGNOSIS — I4891 Unspecified atrial fibrillation: Secondary | ICD-10-CM | POA: Diagnosis present

## 2017-04-21 DIAGNOSIS — Y95 Nosocomial condition: Secondary | ICD-10-CM | POA: Diagnosis present

## 2017-04-21 DIAGNOSIS — F329 Major depressive disorder, single episode, unspecified: Secondary | ICD-10-CM | POA: Diagnosis present

## 2017-04-21 DIAGNOSIS — M109 Gout, unspecified: Secondary | ICD-10-CM | POA: Diagnosis present

## 2017-04-21 DIAGNOSIS — Z8673 Personal history of transient ischemic attack (TIA), and cerebral infarction without residual deficits: Secondary | ICD-10-CM

## 2017-04-21 DIAGNOSIS — Z9841 Cataract extraction status, right eye: Secondary | ICD-10-CM | POA: Diagnosis not present

## 2017-04-21 DIAGNOSIS — J918 Pleural effusion in other conditions classified elsewhere: Secondary | ICD-10-CM | POA: Diagnosis present

## 2017-04-21 DIAGNOSIS — Z87891 Personal history of nicotine dependence: Secondary | ICD-10-CM

## 2017-04-21 DIAGNOSIS — Z9842 Cataract extraction status, left eye: Secondary | ICD-10-CM | POA: Diagnosis not present

## 2017-04-21 DIAGNOSIS — Z85820 Personal history of malignant melanoma of skin: Secondary | ICD-10-CM

## 2017-04-21 DIAGNOSIS — J9601 Acute respiratory failure with hypoxia: Secondary | ICD-10-CM | POA: Diagnosis present

## 2017-04-21 DIAGNOSIS — Z79899 Other long term (current) drug therapy: Secondary | ICD-10-CM | POA: Diagnosis not present

## 2017-04-21 DIAGNOSIS — Z7401 Bed confinement status: Secondary | ICD-10-CM | POA: Diagnosis not present

## 2017-04-21 DIAGNOSIS — G8929 Other chronic pain: Secondary | ICD-10-CM | POA: Diagnosis present

## 2017-04-21 DIAGNOSIS — Z66 Do not resuscitate: Secondary | ICD-10-CM | POA: Diagnosis present

## 2017-04-21 DIAGNOSIS — N179 Acute kidney failure, unspecified: Secondary | ICD-10-CM | POA: Diagnosis present

## 2017-04-21 DIAGNOSIS — Z515 Encounter for palliative care: Secondary | ICD-10-CM | POA: Diagnosis not present

## 2017-04-21 DIAGNOSIS — J189 Pneumonia, unspecified organism: Secondary | ICD-10-CM | POA: Diagnosis not present

## 2017-04-21 DIAGNOSIS — N183 Chronic kidney disease, stage 3 (moderate): Secondary | ICD-10-CM | POA: Diagnosis present

## 2017-04-21 DIAGNOSIS — Z8043 Family history of malignant neoplasm of testis: Secondary | ICD-10-CM

## 2017-04-21 DIAGNOSIS — Z981 Arthrodesis status: Secondary | ICD-10-CM | POA: Diagnosis not present

## 2017-04-21 DIAGNOSIS — Z808 Family history of malignant neoplasm of other organs or systems: Secondary | ICD-10-CM

## 2017-04-21 DIAGNOSIS — Z800 Family history of malignant neoplasm of digestive organs: Secondary | ICD-10-CM

## 2017-04-21 DIAGNOSIS — M549 Dorsalgia, unspecified: Secondary | ICD-10-CM | POA: Diagnosis present

## 2017-04-21 DIAGNOSIS — J9602 Acute respiratory failure with hypercapnia: Secondary | ICD-10-CM

## 2017-04-21 DIAGNOSIS — Z885 Allergy status to narcotic agent status: Secondary | ICD-10-CM

## 2017-04-21 DIAGNOSIS — N189 Chronic kidney disease, unspecified: Secondary | ICD-10-CM | POA: Diagnosis present

## 2017-04-21 DIAGNOSIS — R0600 Dyspnea, unspecified: Secondary | ICD-10-CM | POA: Diagnosis not present

## 2017-04-21 DIAGNOSIS — I129 Hypertensive chronic kidney disease with stage 1 through stage 4 chronic kidney disease, or unspecified chronic kidney disease: Secondary | ICD-10-CM | POA: Diagnosis present

## 2017-04-21 DIAGNOSIS — J90 Pleural effusion, not elsewhere classified: Secondary | ICD-10-CM

## 2017-04-21 DIAGNOSIS — Z882 Allergy status to sulfonamides status: Secondary | ICD-10-CM

## 2017-04-21 DIAGNOSIS — G4733 Obstructive sleep apnea (adult) (pediatric): Secondary | ICD-10-CM | POA: Diagnosis present

## 2017-04-21 LAB — I-STAT CG4 LACTIC ACID, ED: Lactic Acid, Venous: 0.72 mmol/L (ref 0.5–1.9)

## 2017-04-21 LAB — CBC WITH DIFFERENTIAL/PLATELET
Basophils Absolute: 0 10*3/uL (ref 0.0–0.1)
Basophils Relative: 0 %
Eosinophils Absolute: 0 10*3/uL (ref 0.0–0.7)
Eosinophils Relative: 0 %
HCT: 39.2 % (ref 39.0–52.0)
HEMOGLOBIN: 12.1 g/dL — AB (ref 13.0–17.0)
LYMPHS ABS: 4 10*3/uL (ref 0.7–4.0)
LYMPHS PCT: 17 %
MCH: 29.4 pg (ref 26.0–34.0)
MCHC: 30.9 g/dL (ref 30.0–36.0)
MCV: 95.1 fL (ref 78.0–100.0)
MONOS PCT: 11 %
Monocytes Absolute: 2.6 10*3/uL — ABNORMAL HIGH (ref 0.1–1.0)
Neutro Abs: 16.7 10*3/uL — ABNORMAL HIGH (ref 1.7–7.7)
Neutrophils Relative %: 72 %
PLATELETS: 427 10*3/uL — AB (ref 150–400)
RBC: 4.12 MIL/uL — AB (ref 4.22–5.81)
RDW: 16.7 % — ABNORMAL HIGH (ref 11.5–15.5)
WBC: 23.3 10*3/uL — AB (ref 4.0–10.5)

## 2017-04-21 LAB — COMPREHENSIVE METABOLIC PANEL
ALBUMIN: 3.1 g/dL — AB (ref 3.5–5.0)
ALT: 28 U/L (ref 17–63)
AST: 49 U/L — AB (ref 15–41)
Alkaline Phosphatase: 121 U/L (ref 38–126)
Anion gap: 10 (ref 5–15)
BUN: 43 mg/dL — AB (ref 6–20)
CHLORIDE: 112 mmol/L — AB (ref 101–111)
CO2: 22 mmol/L (ref 22–32)
Calcium: 9.5 mg/dL (ref 8.9–10.3)
Creatinine, Ser: 2.57 mg/dL — ABNORMAL HIGH (ref 0.61–1.24)
GFR calc Af Amer: 24 mL/min — ABNORMAL LOW (ref >60)
GFR, EST NON AFRICAN AMERICAN: 21 mL/min — AB (ref >60)
GLUCOSE: 115 mg/dL — AB (ref 65–99)
POTASSIUM: 5.4 mmol/L — AB (ref 3.5–5.1)
SODIUM: 144 mmol/L (ref 135–145)
Total Bilirubin: 0.6 mg/dL (ref 0.3–1.2)
Total Protein: 8.3 g/dL — ABNORMAL HIGH (ref 6.5–8.1)

## 2017-04-21 LAB — BLOOD GAS, ARTERIAL
ACID-BASE DEFICIT: 10.8 mmol/L — AB (ref 0.0–2.0)
BICARBONATE: 21 mmol/L (ref 20.0–28.0)
DRAWN BY: 308601
Delivery systems: POSITIVE
Expiratory PAP: 10
FIO2: 80
Inspiratory PAP: 20
O2 Saturation: 95.1 %
PATIENT TEMPERATURE: 37
PCO2 ART: 77.9 mmHg — AB (ref 32.0–48.0)
PH ART: 7.06 — AB (ref 7.350–7.450)
pO2, Arterial: 94.6 mmHg (ref 83.0–108.0)

## 2017-04-21 LAB — I-STAT TROPONIN, ED: Troponin i, poc: 0.13 ng/mL (ref 0.00–0.08)

## 2017-04-21 LAB — BRAIN NATRIURETIC PEPTIDE: B NATRIURETIC PEPTIDE 5: 682 pg/mL — AB (ref 0.0–100.0)

## 2017-04-21 MED ORDER — LIDOCAINE HCL 2 % IJ SOLN
20.0000 mL | Freq: Once | INTRAMUSCULAR | Status: DC
  Filled 2017-04-21: qty 20

## 2017-04-21 MED ORDER — GLYCOPYRROLATE 0.2 MG/ML IJ SOLN: 0.2000 mg | INTRAMUSCULAR | Status: DC | PRN

## 2017-04-21 MED ORDER — PIPERACILLIN-TAZOBACTAM 3.375 G IVPB 30 MIN: 3.3750 g | INTRAVENOUS | Status: DC

## 2017-04-21 MED ORDER — HALOPERIDOL LACTATE 5 MG/ML IJ SOLN: 0.5000 mg | INTRAMUSCULAR | Status: DC | PRN

## 2017-04-21 MED ORDER — MORPHINE BOLUS VIA INFUSION
1.0000 mg | INTRAVENOUS | Status: DC | PRN
  Administered 2017-04-21 – 2017-04-22 (×2): 1 mg via INTRAVENOUS
  Filled 2017-04-21: qty 1

## 2017-04-21 MED ORDER — ONDANSETRON HCL 4 MG/2ML IJ SOLN
4.0000 mg | Freq: Four times a day (QID) | INTRAMUSCULAR | Status: DC | PRN
  Administered 2017-04-21: 4 mg via INTRAVENOUS
  Filled 2017-04-21: qty 2

## 2017-04-21 MED ORDER — BIOTENE DRY MOUTH MT LIQD: 15.0000 mL | OROMUCOSAL | Status: DC | PRN

## 2017-04-21 MED ORDER — MORPHINE 100MG IN NS 100ML (1MG/ML) PREMIX INFUSION
0.0000 mg/h | INTRAVENOUS | Status: DC
  Filled 2017-04-21: qty 100

## 2017-04-21 MED ORDER — ACETAMINOPHEN 325 MG PO TABS: 650.0000 mg | ORAL_TABLET | Freq: Four times a day (QID) | ORAL | Status: DC | PRN

## 2017-04-21 MED ORDER — ONDANSETRON 4 MG PO TBDP: 4.0000 mg | ORAL_TABLET | Freq: Four times a day (QID) | ORAL | Status: DC | PRN

## 2017-04-21 MED ORDER — GLYCOPYRROLATE 1 MG PO TABS
1.0000 mg | ORAL_TABLET | ORAL | Status: DC | PRN
  Filled 2017-04-21: qty 1

## 2017-04-21 MED ORDER — HALOPERIDOL LACTATE 2 MG/ML PO CONC
0.5000 mg | ORAL | Status: DC | PRN
  Filled 2017-04-21: qty 0.3

## 2017-04-21 MED ORDER — ACETAMINOPHEN 650 MG RE SUPP: 650.0000 mg | Freq: Four times a day (QID) | RECTAL | Status: DC | PRN

## 2017-04-21 MED ORDER — MORPHINE 100MG IN NS 100ML (1MG/ML) PREMIX INFUSION
1.0000 mg/h | INTRAVENOUS | Status: DC
  Administered 2017-04-21: 1 mg/h via INTRAVENOUS
  Administered 2017-04-21: 2 mg/h via INTRAVENOUS
  Filled 2017-04-21 (×2): qty 100

## 2017-04-21 MED ORDER — LIP MEDEX EX OINT
TOPICAL_OINTMENT | CUTANEOUS | Status: AC
  Filled 2017-04-21: qty 7

## 2017-04-21 MED ORDER — HALOPERIDOL 0.5 MG PO TABS
0.5000 mg | ORAL_TABLET | ORAL | Status: DC | PRN
  Filled 2017-04-21: qty 1

## 2017-04-21 MED ORDER — POLYVINYL ALCOHOL 1.4 % OP SOLN: 1.0000 [drp] | Freq: Four times a day (QID) | OPHTHALMIC | Status: DC | PRN

## 2017-04-21 MED ORDER — VANCOMYCIN HCL 10 G IV SOLR
1500.0000 mg | Freq: Once | INTRAVENOUS | Status: AC
  Administered 2017-04-21: 1500 mg via INTRAVENOUS
  Filled 2017-04-21: qty 1500

## 2017-04-21 NOTE — ED Notes (Signed)
Pt refuses nasal cannula

## 2017-04-21 NOTE — ED Triage Notes (Signed)
Pt BIB [facility] from [facility] c/o worsened SOB today. 10mg  of albuterol and 0.5 atrovent, and 125 mg of solumedrol in with EMS. Dx'd with pneumonia today, sent here for further eval. Lung sounds diminished.

## 2017-04-21 NOTE — Progress Notes (Signed)
A consult was received from an ED physician for zosyn and vancomycin per pharmacy dosing.  The patient's profile has been reviewed for ht/wt/allergies/indication/available labs.   A one time order has been placed for zosyn 3.375 Gm and Vancomycin 1500 mg.  Further antibiotics/pharmacy consults should be ordered by admitting physician if indicated.                       Thank you, Dorrene German 04/20/2017  2:39 AM

## 2017-04-21 NOTE — ED Provider Notes (Signed)
[facility] DEPT Provider Note   CSN: [id number] Arrival date & time: 04/23/2017  0012     History   Chief Complaint Chief Complaint  Patient presents with  . Shortness of Breath    HPI Ricardo Allen is a 84 y.o. male.  Patient brought to the emergency department for evaluation of shortness of breath.  Patient reports that he has been sick for the last 2 weeks or so.  He has had chest congestion and cough.  He had an outpatient chest x-ray today that showed concern for bilateral pneumonia.  His breathing worsened tonight and he was brought to the ER by ambulance from nursing home.  Patient administered albuterol 10 mg, Atrovent 0.5 mg, Solu-Medrol 125 mg during transport.     Past Medical History:  Diagnosis Date  . Anemia of other chronic disease 2008   EGD and colonoscopy in 2014 with diverticulosis only  . Atrial fibrillation ([place]) 11/27/2009  . 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 DEPRESSION 08/27/2006  . DISORDER, LUMBAR DISC W/MYELOPATHY 08/19/2006  . Diverticulosis   . GOUT 03/22/2009  . HYPERLIPIDEMIA 08/27/2006  . HYPOTENSION, ORTHOSTATIC 10/31/2009  . Impaired fasting glucose 01/27/2007  . LOW BACK PAIN 02/03/2007   Lumbar radiculopathy 09/2006  . LUNG NODULE 03/29/2008  . MELANOMA, MALIGNANT, TRUNK 08/19/2006   early stage; just exicion.   . OBSTRUCTIVE SLEEP APNEA 08/19/2006  . TOBACCO ABUSE 12/31/2007  . TRAUMATIC ARTHROPATHY PELVIC REGION AND THIGH 06/29/2009  . Wet senile macular degeneration [facility])     Patient Active Problem List   Diagnosis Date Noted  . Pneumonia due to Streptococcus pneumoniae ([place]) 02/06/2017  . Pneumonia 02/06/2017  . Chronic kidney disease 08/25/2013  . Intracranial hemorrhage ([place]) 04/04/2013  . Subdural hygroma 04/04/2013  . Restless leg syndrome 08/03/2012  . Diverticulosis of colon (without mention of hemorrhage) 06/21/2012  . Anemia 06/18/2012    Class: Acute  . GI bleed 06/18/2012    Class:  Chronic  . Atrial fibrillation ([place]) 11/27/2009  . HYPOTENSION, ORTHOSTATIC 10/31/2009  . Other specified iron deficiency anemias 09/05/2009  . TRAUMATIC ARTHROPATHY PELVIC REGION AND THIGH 06/29/2009  . GOUT 03/22/2009  . NAUSEA 03/08/2009  . LUNG NODULE 03/29/2008  . WEIGHT LOSS, ABNORMAL 03/22/2008  . LOW BACK PAIN 02/03/2007  . LUMBAR RADICULOPATHY, LEFT 10/09/2006  . DM W/MANIFESTATION NEC, TYPE II, UNCONTROLLED 08/27/2006  . HYPERLIPIDEMIA 08/27/2006  . DEMENTIA 08/27/2006  . DEPRESSION 08/27/2006  . MELANOMA, MALIGNANT, TRUNK 08/19/2006  . Complex sleep apnea syndrome 08/19/2006    Past Surgical History:  Procedure Laterality Date  . CATARACT EXTRACTION Bilateral   . COLONOSCOPY N/A 06/21/2012   Procedure: COLONOSCOPY;  Surgeon: Inda Castle, MD;  Location: [facility] ENDOSCOPY;  Service: Endoscopy;  Laterality: N/A;  . ESOPHAGOGASTRODUODENOSCOPY N/A 06/20/2012   Procedure: ESOPHAGOGASTRODUODENOSCOPY (EGD);  Surgeon: Inda Castle, MD;  Location: [facility];  Service: Endoscopy;  Laterality: N/A;  . LUMBAR FUSION    . LUMBAR LAMINECTOMY    . MELANOMA EXCISION     left lower back  . TONSILECTOMY, ADENOIDECTOMY, BILATERAL MYRINGOTOMY AND TUBES          Home Medications    Prior to Admission medications   Medication Sig Start Date End Date Taking? Authorizing Provider  acetaminophen (TYLENOL) 650 MG CR tablet Take 1,300 mg by mouth 2 (two) times daily.    Yes [provider]  allopurinol (ZYLOPRIM) 300 MG tablet Take 300 mg by mouth  daily.   Yes [provider]  benzonatate (TESSALON) 100 MG capsule Take 100 mg by mouth every 8 (eight) hours as needed for cough.   Yes [provider]  busPIRone (BUSPAR) 10 MG tablet Take 10 mg by mouth 2 (two) times daily.   Yes [provider]  Cholecalciferol (VITAMIN D) 2000 units CAPS Take 2,000 Units by mouth daily.   Yes [provider]  colchicine 0.6 MG tablet Take 0.6 mg by mouth daily  as needed (for gout).   Yes [provider]  donepezil (ARICEPT) 10 MG tablet Take 10 mg by mouth at bedtime.   Yes [provider]  fluticasone (FLONASE) 50 MCG/ACT nasal spray Place 1 spray into both nostrils daily as needed for allergies.    Yes [provider]  guaiFENesin (MUCINEX) 600 MG 12 hr tablet Take 600 mg by mouth 2 (two) times daily.   Yes [provider]  ibuprofen (ADVIL,MOTRIN) 600 MG tablet Take 600 mg by mouth 2 (two) times daily as needed for moderate pain.   Yes [provider]  loperamide (IMODIUM) 2 MG capsule Take 2 mg by mouth 3 (three) times daily as needed for diarrhea or loose stools.   Yes [provider]  loratadine (CLARITIN) 10 MG tablet Take 10 mg by mouth daily as needed for allergies.   Yes [provider]  Melatonin 3 MG TABS Take 3 mg by mouth at bedtime as needed (insomnia).   Yes [provider]  metoprolol tartrate (LOPRESSOR) 25 MG tablet Take 25 mg by mouth 2 (two) times daily.   Yes [provider]  mirtazapine (REMERON) 15 MG tablet Take 15 mg by mouth at bedtime.   Yes [provider]  ondansetron (ZOFRAN) 4 MG tablet Take 4 mg by mouth 2 (two) times daily as needed for nausea or vomiting.   Yes [provider]  oxyCODONE-acetaminophen (PERCOCET/ROXICET) 5-325 MG tablet Take 1 tablet by mouth 2 (two) times daily as needed for severe pain.   Yes [provider]  Probiotic Product (ALIGN) 4 MG CAPS Take 4 mg by mouth daily.   Yes [provider]  Probiotic Product (PROBIOTIC DAILY PO) Take 1 capsule by mouth daily.    Yes [provider]  rOPINIRole (REQUIP) 0.5 MG tablet Take 0.5 mg by mouth at bedtime.   Yes [provider]  traMADol (ULTRAM) 50 MG tablet Take 50 mg by mouth 3 (three) times daily as needed for moderate pain.   Yes [provider]  azithromycin (ZITHROMAX) 500 MG tablet Take 1 tablet (500 mg total)  by mouth daily. Patient not taking: Reported on 05/03/2017 02/07/17   Cristy Folks, MD    Family History Family History  Problem Relation Age of Onset  . Pancreatic cancer Mother   . Testicular cancer Father   . Arthritis Brother   . Cancer Maternal Aunt        head/neck cancer    Social History Social History   Tobacco Use  . Smoking status: Former Smoker    Packs/day: 2.00    Years: 60.00    Pack years: 120.00    Types: Cigarettes    Last attempt to quit: 04/14/2008    Years since quitting: 9.0  . Smokeless tobacco: Never Used  Substance Use Topics  . Alcohol use: Yes    Alcohol/week: 4.4 oz    Types: 4 Glasses of wine, 4 Standard drinks or equivalent per week  . Drug  use: No     Allergies   Fentanyl; Hydromorphone hcl; and Sulfonamide derivatives   Review of Systems Review of Systems  Respiratory: Positive for cough and shortness of breath.   All other systems reviewed and are negative.    Physical Exam Updated Vital Signs BP (!) 154/85 (BP Location: Left Arm)   Pulse (!) 123   Resp (!) 38   SpO2 95%   Physical Exam  Constitutional: He is oriented to person, place, and time. He appears well-developed and well-nourished. No distress.  HENT:  Head: Normocephalic and atraumatic.  Right Ear: Hearing normal.  Left Ear: Hearing normal.  Nose: Nose normal.  Mouth/Throat: Oropharynx is clear and moist and mucous membranes are normal.  Eyes: Pupils are equal, round, and reactive to light. Conjunctivae and EOM are normal.  Neck: Normal range of motion. Neck supple.  Cardiovascular: Regular rhythm, S1 normal and S2 normal. Tachycardia present. Exam reveals no gallop and no friction rub.  No murmur heard. Pulmonary/Chest: Accessory muscle usage present. Tachypnea noted. He has decreased breath sounds. He has rhonchi. He has rales. He exhibits no tenderness.  Abdominal: Soft. Normal appearance and bowel sounds are normal. There is no hepatosplenomegaly. There is  no tenderness. There is no rebound, no guarding, no tenderness at McBurney's point and negative Murphy's sign. No hernia.  Musculoskeletal: Normal range of motion.  Neurological: He is alert and oriented to person, place, and time. He has normal strength. No cranial nerve deficit or sensory deficit. Coordination normal. GCS eye subscore is 4. GCS verbal subscore is 5. GCS motor subscore is 6.  Skin: Skin is warm, dry and intact. No rash noted. No cyanosis.  Psychiatric: He has a normal mood and affect. His speech is normal and behavior is normal. Thought content normal.  Nursing note and vitals reviewed.    ED Treatments / Results  Labs (all labs ordered are listed, but only abnormal results are displayed) Labs Reviewed  COMPREHENSIVE METABOLIC PANEL - Abnormal; Notable for the following components:      Result Value   Potassium 5.4 (*)    Chloride 112 (*)    Glucose, Bld 115 (*)    BUN 43 (*)    Creatinine, Ser 2.57 (*)    Total Protein 8.3 (*)    Albumin 3.1 (*)    AST 49 (*)    GFR calc non Af Amer 21 (*)    GFR calc Af Amer 24 (*)    All other components within normal limits  CBC WITH DIFFERENTIAL/PLATELET - Abnormal; Notable for the following components:   WBC 23.3 (*)    RBC 4.12 (*)    Hemoglobin 12.1 (*)    RDW 16.7 (*)    Platelets 427 (*)    Neutro Abs 16.7 (*)    Monocytes Absolute 2.6 (*)    All other components within normal limits  BRAIN NATRIURETIC PEPTIDE - Abnormal; Notable for the following components:   B Natriuretic Peptide 682.0 (*)    All other components within normal limits  BLOOD GAS, ARTERIAL - Abnormal; Notable for the following components:   pH, Arterial 7.060 (*)    pCO2 arterial 77.9 (*)    Acid-base deficit 10.8 (*)    All other components within normal limits  I-STAT TROPONIN, ED - Abnormal; Notable for the following components:   Troponin i, poc 0.13 (*)    All other components within normal limits  CULTURE, BLOOD (ROUTINE X 2)  CULTURE,  BLOOD (ROUTINE X  2)  I-STAT CG4 LACTIC ACID, ED  I-STAT CG4 LACTIC ACID, ED    EKG EKG Interpretation  Date/Time:  Monday April 21 2017 00:32:59 EDT Ventricular Rate:  120 PR Interval:    QRS Duration: 143 QT Interval:  344 QTC Calculation: 486 R Axis:   -52 Text Interpretation:  Sinus tachycardia Atrial premature complexes in couplets RBBB and LAFB No significant change since last tracing Confirmed by Orpah Greek [phone]) on 04/20/2017 1:21:27 AM   Radiology Dg Chest 2 View  Result Date: 04/28/2017 CLINICAL DATA:  Pneumonia and dyspnea. EXAM: CHEST - 2 VIEW COMPARISON:  02/06/2017 FINDINGS: Cardiomegaly partially obscured left heart border from large left effusion. The left pleural effusion spans 4-1/2 vertebral body heights on the lateral view. Pulmonary vascular redistribution is seen bilaterally. The visualized skeletal structures are unremarkable. IMPRESSION: Cardiomegaly with CHF and large left pleural effusion. Electronically Signed   By: Ashley Royalty M.D.   On: 04/24/2017 01:15   Ct Chest Wo Contrast  Result Date: 04/28/2017 CLINICAL DATA:  Increasing shortness of breath. Recent diagnosis of pneumonia. EXAM: CT CHEST WITHOUT CONTRAST TECHNIQUE: Multidetector CT imaging of the chest was performed following the standard protocol without IV contrast. COMPARISON:  Chest 05/04/2017.  CT chest 03/25/2008 FINDINGS: Cardiovascular: Cardiac enlargement. Coronary artery and aortic valve calcifications. No pericardial effusion. Calcification of the aorta. No aneurysm. Mediastinum/Nodes: No significant lymphadenopathy. Esophagus is decompressed. Lungs/Pleura: Bilateral pleural effusions, large on the left and small on the right. Bilateral lower lung consolidation and/or atelectasis. Bilateral calcified pleural plaques suggest previous asbestos exposure. In the left lingula anteriorly, there is a focal nodular opacity measuring 1.7 cm diameter an additional smaller nodules are demonstrated  in the adjacent regions. This could represent a focal area of rounded atelectasis or inflammatory process but neoplasm may also have this appearance. Follow-up imaging after resolution of the pleural effusion is recommended for better evaluation. Debris is demonstrated in right lower lobe bronchi. This may represent secretions due to pneumonia or aspiration. No pneumothorax. Upper Abdomen: No acute abnormality. Musculoskeletal: Pectus excavatum. Degenerative changes in the spine. Anterior compression of T12. This was present on a previous lumbar spine series from 10/31/2015, suggesting chronic compression period. IMPRESSION: 1. Large left and small right pleural effusions with bilateral lower lung consolidation. This could indicate pneumonia or atelectasis. 2. Secretions and right lower lobe bronchus suggest pneumonia or aspiration. 3. 1.7 cm nodule in the left lingula with adjacent smaller nodules. Follow-up after resolution of the pleural effusion is recommended to exclude underlying neoplasm. 4. Calcified pleural plaques.  Likely asbestos exposure. 5. Aortic atherosclerosis. 6. Cardiac enlargement. Aortic Atherosclerosis (ICD10-I70.0). Electronically Signed   By: Lucienne Capers M.D.   On: 04/14/2017 02:28    Procedures Procedures (including critical care time)  Medications Ordered in ED Medications  piperacillin-tazobactam (ZOSYN) IVPB 3.375 g (has no administration in time range)  vancomycin (VANCOCIN) 1,500 mg in sodium chloride 0.9 % 500 mL IVPB (1,500 mg Intravenous New Bag/Given 04/30/2017 0255)  lidocaine (XYLOCAINE) 2 % (with pres) injection 400 mg (has no administration in time range)  acetaminophen (TYLENOL) tablet 650 mg (has no administration in time range)    Or  acetaminophen (TYLENOL) suppository 650 mg (has no administration in time range)  haloperidol (HALDOL) tablet 0.5 mg (has no administration in time range)    Or  haloperidol (HALDOL) 2 MG/ML solution 0.5 mg (has no  administration in time range)    Or  haloperidol lactate (HALDOL) injection 0.5 mg (has no administration  in time range)  ondansetron (ZOFRAN-ODT) disintegrating tablet 4 mg (has no administration in time range)    Or  ondansetron (ZOFRAN) injection 4 mg (has no administration in time range)  glycopyrrolate (ROBINUL) tablet 1 mg (has no administration in time range)    Or  glycopyrrolate (ROBINUL) injection 0.2 mg (has no administration in time range)    Or  glycopyrrolate (ROBINUL) injection 0.2 mg (has no administration in time range)  antiseptic oral rinse (BIOTENE) solution 15 mL (has no administration in time range)  polyvinyl alcohol (LIQUIFILM TEARS) 1.4 % ophthalmic solution 1 drop (has no administration in time range)  morphine bolus via infusion 1 mg (has no administration in time range)  morphine 100mg  in NS 186mL (1mg /mL) infusion - premix (has no administration in time range)     Initial Impression / Assessment and Plan / ED Course  I have reviewed the triage vital signs and the nursing notes.  Pertinent labs & imaging results that were available during my care of the patient were reviewed by me and considered in my medical decision making (see chart for details).     Patient presents to the emergency department for evaluation of difficulty breathing.  Patient reports that he has been sick for several weeks.  His cough has progressively worsened, outpatient x-ray today was felt to be consistent with pneumonia.  He was started on Zithromax.  Tonight, however, his breathing worsened.  At arrival he was tachypneic and tachycardic.  Chest x-ray revealed large pleural effusion.  He had significant leukocytosis, however.  The effusion was considered and CT scan was performed.  This did show evidence of consolidation as well as effusion.  Patient initiated on Zosyn and vancomycin.  His lactic acid was not elevated, no signs of severe sepsis.  Did not require aggressive IV fluid  hydration, as he did appear to be volume overloaded.  Patient has progressively worsened here in the ER.  He was awake and alert at arrival, but has become more somnolent.  Blood gas reveals hypercarbia.  He was placed on BiPAP.  Discussion with hospitalist for admission resulted in consideration for urgent thoracentesis.  Arrangements were made for this procedure, however, patient's power of attorney, Burna Forts, was contacted and he felt that the patient would not want this procedure performed.  I did discuss the procedure with him myself.  He understands the procedure.  I did inform him that I felt that the procedure would not likely result in the patient surviving his current infection.  [name] has therfore requested comfort measures.  He will contact the patient's wife.  Patient will therefore be admitted for comfort care.  CRITICAL CARE Performed by: Orpah Greek   Total critical care time: 30 minutes  Critical care time was exclusive of separately billable procedures and treating other patients.  Critical care was necessary to treat or prevent imminent or life-threatening deterioration.  Critical care was time spent personally by me on the following activities: development of treatment plan with patient and/or surrogate as well as nursing, discussions with consultants, evaluation of patient's response to treatment, examination of patient, obtaining history from patient or surrogate, ordering and performing treatments and interventions, ordering and review of laboratory studies, ordering and review of radiographic studies, pulse oximetry and re-evaluation of patient's condition.   Final Clinical Impressions(s) / ED Diagnoses   Final diagnoses:  Pleural effusion  Acute respiratory failure with hypoxia and hypercapnia [facility])    ED Discharge Orders  None       Orpah Greek, MD 05/03/[id number]

## 2017-04-21 NOTE — H&P (Signed)
History and Physical    Ricardo Allen HYW:[id number] DOB: [birth year] DOA: 05/02/2017  PCP: Velna Hatchet, MD  Patient coming from: [facility]  I have personally briefly reviewed patient's old medical records in [place]  Chief Complaint: Respiratory distress  HPI: Ricardo Allen is a 84 y.o. male with medical history significant of HTN, depression, chronic back pain.  Patient presents to the ED with respiratory distress.  Per EDP notes: Patient reports that he has been sick for the last 2 weeks or so.  He has had chest congestion and cough.  He had an outpatient chest x-ray today that showed concern for bilateral pneumonia.  His breathing worsened tonight and he was brought to the ER by ambulance from nursing home.  Patient administered albuterol 10 mg, Atrovent 0.5 mg, Solu-Medrol 125 mg during transport.   ED Course: patient given zosyn and vanc.  CT scan shows PNA and large L pleural effusion.  Unfortunately patients mental status has deteriorated despite being put on BIPAP.  He is now able to wake up some and answer a couple of yes / no questions, but is quite somnolent.   Review of Systems: As per HPI otherwise 10 point review of systems negative.   Past Medical History:  Diagnosis Date  . Anemia of other chronic disease 2008   EGD and colonoscopy in 2014 with diverticulosis only  . Atrial fibrillation ([place]) 11/27/2009  . 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 DEPRESSION 08/27/2006  . DISORDER, LUMBAR DISC W/MYELOPATHY 08/19/2006  . Diverticulosis   . GOUT 03/22/2009  . HYPERLIPIDEMIA 08/27/2006  . HYPOTENSION, ORTHOSTATIC 10/31/2009  . Impaired fasting glucose 01/27/2007  . LOW BACK PAIN 02/03/2007   Lumbar radiculopathy 09/2006  . LUNG NODULE 03/29/2008  . MELANOMA, MALIGNANT, TRUNK 08/19/2006   early stage; just exicion.   . OBSTRUCTIVE SLEEP APNEA 08/19/2006  . TOBACCO ABUSE 12/31/2007  . TRAUMATIC ARTHROPATHY PELVIC REGION AND THIGH 06/29/2009  . Wet senile  macular degeneration [facility])     Past Surgical History:  Procedure Laterality Date  . CATARACT EXTRACTION Bilateral   . COLONOSCOPY N/A 06/21/2012   Procedure: COLONOSCOPY;  Surgeon: Inda Castle, MD;  Location: [facility] ENDOSCOPY;  Service: Endoscopy;  Laterality: N/A;  . ESOPHAGOGASTRODUODENOSCOPY N/A 06/20/2012   Procedure: ESOPHAGOGASTRODUODENOSCOPY (EGD);  Surgeon: Inda Castle, MD;  Location: [facility];  Service: Endoscopy;  Laterality: N/A;  . LUMBAR FUSION    . LUMBAR LAMINECTOMY    . MELANOMA EXCISION     left lower back  . TONSILECTOMY, ADENOIDECTOMY, BILATERAL MYRINGOTOMY AND TUBES       reports that he quit smoking about 9 years ago. His smoking use included cigarettes. He has a 120.00 pack-year smoking history. He has never used smokeless tobacco. He reports that he drinks about 4.4 oz of alcohol per week. He reports that he does not use drugs.  Allergies  Allergen Reactions  . Fentanyl Nausea Only and Other (See Comments)    REACTION: nausea and depression  . Hydromorphone Hcl Nausea Only and Other (See Comments)    REACTION: depressed and nausea  . Sulfonamide Derivatives Hives    REACTION: hives    Family History  Problem Relation Age of Onset  . Pancreatic cancer Mother   . Testicular cancer Father   . Arthritis Brother   . Cancer Maternal Aunt        head/neck cancer     Prior to Admission medications   Medication Sig  Start Date End Date Taking? Authorizing Provider  acetaminophen (TYLENOL) 650 MG CR tablet Take 1,300 mg by mouth 2 (two) times daily.    Yes [provider]  allopurinol (ZYLOPRIM) 300 MG tablet Take 300 mg by mouth daily.   Yes [provider]  benzonatate (TESSALON) 100 MG capsule Take 100 mg by mouth every 8 (eight) hours as needed for cough.   Yes [provider]  busPIRone (BUSPAR) 10 MG tablet Take 10 mg by mouth 2 (two) times daily.   Yes [provider]  Cholecalciferol (VITAMIN D) 2000 units CAPS  Take 2,000 Units by mouth daily.   Yes [provider]  colchicine 0.6 MG tablet Take 0.6 mg by mouth daily as needed (for gout).   Yes [provider]  donepezil (ARICEPT) 10 MG tablet Take 10 mg by mouth at bedtime.   Yes [provider]  fluticasone (FLONASE) 50 MCG/ACT nasal spray Place 1 spray into both nostrils daily as needed for allergies.    Yes [provider]  guaiFENesin (MUCINEX) 600 MG 12 hr tablet Take 600 mg by mouth 2 (two) times daily.   Yes [provider]  ibuprofen (ADVIL,MOTRIN) 600 MG tablet Take 600 mg by mouth 2 (two) times daily as needed for moderate pain.   Yes [provider]  loperamide (IMODIUM) 2 MG capsule Take 2 mg by mouth 3 (three) times daily as needed for diarrhea or loose stools.   Yes [provider]  loratadine (CLARITIN) 10 MG tablet Take 10 mg by mouth daily as needed for allergies.   Yes [provider]  Melatonin 3 MG TABS Take 3 mg by mouth at bedtime as needed (insomnia).   Yes [provider]  metoprolol tartrate (LOPRESSOR) 25 MG tablet Take 25 mg by mouth 2 (two) times daily.   Yes [provider]  mirtazapine (REMERON) 15 MG tablet Take 15 mg by mouth at bedtime.   Yes [provider]  ondansetron (ZOFRAN) 4 MG tablet Take 4 mg by mouth 2 (two) times daily as needed for nausea or vomiting.   Yes [provider]  oxyCODONE-acetaminophen (PERCOCET/ROXICET) 5-325 MG tablet Take 1 tablet by mouth 2 (two) times daily as needed for severe pain.   Yes [provider]  Probiotic Product (ALIGN) 4 MG CAPS Take 4 mg by mouth daily.   Yes [provider]  Probiotic Product (PROBIOTIC DAILY PO) Take 1 capsule by mouth daily.    Yes [provider]  rOPINIRole (REQUIP) 0.5 MG tablet Take 0.5 mg by mouth at bedtime.   Yes [provider]  traMADol (ULTRAM) 50 MG tablet Take 50 mg by mouth 3 (three) times daily as needed  for moderate pain.   Yes [provider]  azithromycin (ZITHROMAX) 500 MG tablet Take 1 tablet (500 mg total) by mouth daily. Patient not taking: Reported on 04/15/2017 02/07/17   Cristy Folks, MD    Physical Exam: Vitals:   04/15/2017 0030 05/09/2017 0148 05/13/2017 0229 04/23/2017 0333  BP: 136/82 (!) 167/93 (!) 154/85   Pulse: (!) 117 (!) 122 62 (!) 123  Resp:  (!) 29 (!) 36 (!) 38  SpO2: 96% 97% 95% 95%    Constitutional: NAD, calm, comfortable Eyes: PERRL, lids and conjunctivae normal ENMT: Mucous membranes are moist. Posterior pharynx clear of any exudate or lesions.Normal dentition.  Neck: normal, supple, no masses, no thyromegaly Respiratory: Diminished on L, tachypnea noted, rhonchi and rales. Cardiovascular: Regular rate  and rhythm, no murmurs / rubs / gallops. No extremity edema. 2+ pedal pulses. No carotid bruits.  Abdomen: no tenderness, no masses palpated. No hepatosplenomegaly. Bowel sounds positive.  Musculoskeletal: no clubbing / cyanosis. No joint deformity upper and lower extremities. Good ROM, no contractures. Normal muscle tone.  Skin: no rashes, lesions, ulcers. No induration Neurologic: CN 2-12 grossly intact. Sensation intact, DTR normal. Strength 5/5 in all 4.  Psychiatric: Somnolent, is able to wake up to voice, and answer a couple of yes / no questions still.   Labs on Admission: I have personally reviewed following labs and imaging studies  CBC: Recent Labs  Lab 05/08/2017 0053  WBC 23.3*  NEUTROABS 16.7*  HGB 12.1*  HCT 39.2  MCV 95.1  PLT 595*   Basic Metabolic Panel: Recent Labs  Lab 04/18/2017 0053  NA 144  K 5.4*  CL 112*  CO2 22  GLUCOSE 115*  BUN 43*  CREATININE 2.57*  CALCIUM 9.5   GFR: CrCl cannot be calculated (Unknown ideal weight.). Liver Function Tests: Recent Labs  Lab 05/12/2017 0053  AST 49*  ALT 28  ALKPHOS 121  BILITOT 0.6  PROT 8.3*  ALBUMIN 3.1*   No results for input(s): LIPASE, AMYLASE in the last 168  hours. No results for input(s): AMMONIA in the last 168 hours. Coagulation Profile: No results for input(s): INR, PROTIME in the last 168 hours. Cardiac Enzymes: No results for input(s): CKTOTAL, CKMB, CKMBINDEX, TROPONINI in the last 168 hours. BNP (last 3 results) No results for input(s): PROBNP in the last 8760 hours. HbA1C: No results for input(s): HGBA1C in the last 72 hours. CBG: No results for input(s): GLUCAP in the last 168 hours. Lipid Profile: No results for input(s): CHOL, HDL, LDLCALC, TRIG, CHOLHDL, LDLDIRECT in the last 72 hours. Thyroid Function Tests: No results for input(s): TSH, T4TOTAL, FREET4, T3FREE, THYROIDAB in the last 72 hours. Anemia Panel: No results for input(s): VITAMINB12, FOLATE, FERRITIN, TIBC, IRON, RETICCTPCT in the last 72 hours. Urine analysis:    Component Value Date/Time   COLORURINE AMBER (A) 02/05/2017 1320   APPEARANCEUR CLOUDY (A) 02/05/2017 1320   LABSPEC 1.011 02/05/2017 1320   PHURINE 7.0 02/05/2017 1320   GLUCOSEU NEGATIVE 02/05/2017 1320   HGBUR NEGATIVE 02/05/2017 1320   BILIRUBINUR NEGATIVE 02/05/2017 1320   KETONESUR NEGATIVE 02/05/2017 1320   PROTEINUR NEGATIVE 02/05/2017 1320   UROBILINOGEN 0.2 04/19/2014 1244   NITRITE NEGATIVE 02/05/2017 1320   LEUKOCYTESUR NEGATIVE 02/05/2017 1320    Radiological Exams on Admission: Dg Chest 2 View  Result Date: 05/05/2017 CLINICAL DATA:  Pneumonia and dyspnea. EXAM: CHEST - 2 VIEW COMPARISON:  02/06/2017 FINDINGS: Cardiomegaly partially obscured left heart border from large left effusion. The left pleural effusion spans 4-1/2 vertebral body heights on the lateral view. Pulmonary vascular redistribution is seen bilaterally. The visualized skeletal structures are unremarkable. IMPRESSION: Cardiomegaly with CHF and large left pleural effusion. Electronically Signed   By: Ashley Royalty M.D.   On: 04/17/2017 01:15   Ct Chest Wo Contrast  Result Date: 04/14/2017 CLINICAL DATA:  Increasing  shortness of breath. Recent diagnosis of pneumonia. EXAM: CT CHEST WITHOUT CONTRAST TECHNIQUE: Multidetector CT imaging of the chest was performed following the standard protocol without IV contrast. COMPARISON:  Chest 05/01/2017.  CT chest 03/25/2008 FINDINGS: Cardiovascular: Cardiac enlargement. Coronary artery and aortic valve calcifications. No pericardial effusion. Calcification of the aorta. No aneurysm. Mediastinum/Nodes: No significant lymphadenopathy. Esophagus is decompressed. Lungs/Pleura: Bilateral pleural effusions, large on the left and small on  the right. Bilateral lower lung consolidation and/or atelectasis. Bilateral calcified pleural plaques suggest previous asbestos exposure. In the left lingula anteriorly, there is a focal nodular opacity measuring 1.7 cm diameter an additional smaller nodules are demonstrated in the adjacent regions. This could represent a focal area of rounded atelectasis or inflammatory process but neoplasm may also have this appearance. Follow-up imaging after resolution of the pleural effusion is recommended for better evaluation. Debris is demonstrated in right lower lobe bronchi. This may represent secretions due to pneumonia or aspiration. No pneumothorax. Upper Abdomen: No acute abnormality. Musculoskeletal: Pectus excavatum. Degenerative changes in the spine. Anterior compression of T12. This was present on a previous lumbar spine series from 10/31/2015, suggesting chronic compression period. IMPRESSION: 1. Large left and small right pleural effusions with bilateral lower lung consolidation. This could indicate pneumonia or atelectasis. 2. Secretions and right lower lobe bronchus suggest pneumonia or aspiration. 3. 1.7 cm nodule in the left lingula with adjacent smaller nodules. Follow-up after resolution of the pleural effusion is recommended to exclude underlying neoplasm. 4. Calcified pleural plaques.  Likely asbestos exposure. 5. Aortic atherosclerosis. 6. Cardiac  enlargement. Aortic Atherosclerosis (ICD10-I70.0). Electronically Signed   By: Lucienne Capers M.D.   On: 05/09/2017 02:28    EKG: Independently reviewed.  Assessment/Plan Principal Problem:   Admission for end of life care Active Problems:   HCAP (healthcare-associated pneumonia)   Acute respiratory failure with hypoxia and hypercapnia (HCC)   Parapneumonic effusion    1. Admission for end of life care - 1. Patient with likely terminal respiratory failure and PNA 2. ABGs are looking quite dire even on BIPAP with significant CO2 retention 3. I and Dr. Betsey Holiday spoke with his son Ricardo Allen whom our records indicated is the patients HPOA: 1. Mr. Ricardo Allen confirmed to both myself and Dr. Betsey Holiday that he was the HPOA 2. We offered attempt at urgent thoracentesis for large L pleural effusion. 3. However, given how chronically ill the patient was, his DNR/DNI status, his acute ABG findings despite BIPAP, we did inform him that we were concerned that patient wouldn't survive the hospital stay for this pneumonia. 4. Mr. Ricardo Allen, after Dr. Waverly Ferrari discussed options including thoracentesis with him, requested comfort measures only for the patient who has been in poor health as of late even before this acute illness with poor quality of life by Son's own description. 5. He will contact patients wife, he is not planning on coming in tonight to see patient, he understands that there is high likely hood that patient wont survive long and that it is quite likely he will pass on in the next couple mins to hours. 4. Patient will therefore be admitted for comfort measures. 5. Morphine gtt ordered, please titrate to comfort, bolus if needed. 6. Will leave BIPAP as "PRN" order 7. Will hold off on further needle sticks 8. Wont re-order ABx at this point 9. Pal care consult placed into epic.  DVT prophylaxis: None Code Status: DNR/DNI - comfort measures only Family Communication: Spoke with son Ricardo Allen over  phone, discussion as above. Disposition Plan: TBD Consults called: Pal care consult put into Epic for AM Admission status: Admit to inpatient for end of life care and morphine gtt   [place], [facility] Pager [phone]  If 7AM-7PM, please contact day team taking care of patient [email or web address] Password [email or web address]  , 5:06 AM

## 2017-04-21 NOTE — Consult Note (Signed)
Consultation Note Date: 05/12/2017   Patient Name: Ricardo Allen  DOB: Oct 29, 1925  MRN: [id number]  Age / Sex: 84 y.o., male  PCP: Velna Hatchet, MD Referring Physician: Roney Jaffe, MD  Reason for Consultation: Terminal Care  HPI/Patient Profile: 84 y.o. male  admitted on 05/10/2017     Clinical Assessment and Goals of Care:  84 yo gentleman, was at ALF, recently moved to SNF 1 month ago, patient has had declining oral intake for past month or so, unintentionally lost 10 lbs, has been mostly bed bound for past month, not alert, not interactive for most of last month. Patient admitted with terminal respiratory failure and PNA, ABG showed severe CO2 retention.   Patient initially was given BIPAP, how ever, he declined. After discussions with patient, HCPOA agent Son, ED and [facility] MD staff, comfort measures were initiated, morphine drip was started, palliative consult ensues.    The patient is resting in bed in the ED at [facility], he is essentially unresponsive, he moans and sighs some when his name is called, other wise, he is not able to open eyes or follow commands.   I met with son Burna Forts at bedside, I introduced myself and palliative care as follows: Palliative medicine is specialized medical care for people living with serious illness. It focuses on providing relief from the symptoms and stress of a serious illness. The goal is to improve quality of life for both the patient and the family.  We reviewed the patient's overall health for the past few months, we discussed about goals, wishes and values. Son states that the patient himself made the decision to not undergo thoracentesis, he didn't even want to continue with BIPAP, patient elected for comfort measures.   We discussed about end of life signs and symptoms and appropriate symptom management at end of life, all of the patient's son's  questions addressed to the best of my ability.   See additional discussions and recommendations below. Thank you for the consult.    HCPOA  son Burna Forts who is now at the bedside with his wife.   SUMMARY OF RECOMMENDATIONS    re discussed and confirmed DNR DNI, comfort measures with HCPOA agent son Mr Girtha Rm Continue current scope of comfort measures Discussed end of life signs and symptoms with son, offered supportive care and active listening Prognosis hours to some very limited number of days at this point, don't recommend transfer to residential hospice today  Code Status/Advance Care Planning:  DNR    Symptom Management:     As above  Palliative Prophylaxis:   Delirium Protocol  Additional Recommendations (Limitations, Scope, Preferences):  Full Comfort Care  Psycho-social/Spiritual:   Desire for further Chaplaincy support:yes  Additional Recommendations: Caregiving  Support/Resources  Prognosis:   Hours - Days  Discharge Planning: Anticipated Hospital Death      Primary Diagnoses: Present on Admission: . Acute respiratory failure with hypoxia and hypercapnia (HCC) . HCAP (healthcare-associated pneumonia) . Parapneumonic effusion . AKI (acute kidney injury) ([place]) .  Chronic kidney disease   I have reviewed the medical record, interviewed the patient and family, and examined the patient. The following aspects are pertinent.  Past Medical History:  Diagnosis Date  . Anemia of other chronic disease 2008   EGD and colonoscopy in 2014 with diverticulosis only  . Atrial fibrillation ([place]) 11/27/2009  . 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 DEPRESSION 08/27/2006  . DISORDER, LUMBAR DISC W/MYELOPATHY 08/19/2006  . Diverticulosis   . GOUT 03/22/2009  . HYPERLIPIDEMIA 08/27/2006  . HYPOTENSION, ORTHOSTATIC 10/31/2009  . Impaired fasting glucose 01/27/2007  . LOW BACK PAIN 02/03/2007   Lumbar radiculopathy 09/2006  . LUNG NODULE 03/29/2008  . MELANOMA,  MALIGNANT, TRUNK 08/19/2006   early stage; just exicion.   . OBSTRUCTIVE SLEEP APNEA 08/19/2006  . TOBACCO ABUSE 12/31/2007  . TRAUMATIC ARTHROPATHY PELVIC REGION AND THIGH 06/29/2009  . Wet senile macular degeneration ([place])    Social History   Socioeconomic History  . Marital status: Married    Spouse name: Not on file  . Number of children: 0  . Years of education: Not on file  . Highest education level: Not on file  Occupational History    Employer: RETIRED    Comment: interior designer/architect  Social Needs  . Financial resource strain: Not on file  . Food insecurity:    Worry: Not on file    Inability: Not on file  . Transportation needs:    Medical: Not on file    Non-medical: Not on file  Tobacco Use  . Smoking status: Former Smoker    Packs/day: 2.00    Years: 60.00    Pack years: 120.00    Types: Cigarettes    Last attempt to quit: 04/14/2008    Years since quitting: 9.0  . Smokeless tobacco: Never Used  Substance and Sexual Activity  . Alcohol use: Yes    Alcohol/week: 4.4 oz    Types: 4 Glasses of wine, 4 Standard drinks or equivalent per week  . Drug use: No  . Sexual activity: Never  Lifestyle  . Physical activity:    Days per week: Not on file    Minutes per session: Not on file  . Stress: Not on file  Relationships  . Social connections:    Talks on phone: Not on file    Gets together: Not on file    Attends religious service: Not on file    Active member of club or organization: Not on file    Attends meetings of clubs or organizations: Not on file    Relationship status: Not on file  Other Topics Concern  . Not on file  Social History Narrative  . Not on file   Family History  Problem Relation Age of Onset  . Pancreatic cancer Mother   . Testicular cancer Father   . Arthritis Brother   . Cancer Maternal Aunt        head/neck cancer   Scheduled Meds: . lidocaine  20 mL Infiltration Once   Continuous Infusions: . morphine     PRN  Meds:.acetaminophen **OR** acetaminophen, antiseptic oral rinse, glycopyrrolate **OR** glycopyrrolate **OR** glycopyrrolate, haloperidol **OR** haloperidol **OR** haloperidol lactate, morphine, ondansetron **OR** ondansetron (ZOFRAN) IV, polyvinyl alcohol Medications Prior to Admission:  Prior to Admission medications   Medication Sig Start Date End Date Taking? Authorizing Provider  acetaminophen (TYLENOL) 650 MG CR tablet Take 1,300 mg by mouth 2 (two) times daily.    Yes [provider]  allopurinol (ZYLOPRIM) 300 MG tablet Take 300 mg by mouth daily.   Yes [provider]  benzonatate (TESSALON) 100 MG capsule Take 100 mg by mouth every 8 (eight) hours as needed for cough.   Yes [provider]  busPIRone (BUSPAR) 10 MG tablet Take 10 mg by mouth 2 (two) times daily.   Yes [provider]  Cholecalciferol (VITAMIN D) 2000 units CAPS Take 2,000 Units by mouth daily.   Yes [provider]  colchicine 0.6 MG tablet Take 0.6 mg by mouth daily as needed (for gout).   Yes [provider]  donepezil (ARICEPT) 10 MG tablet Take 10 mg by mouth at bedtime.   Yes [provider]  fluticasone (FLONASE) 50 MCG/ACT nasal spray Place 1 spray into both nostrils daily as needed for allergies.    Yes [provider]  guaiFENesin (MUCINEX) 600 MG 12 hr tablet Take 600 mg by mouth 2 (two) times daily.   Yes [provider]  ibuprofen (ADVIL,MOTRIN) 600 MG tablet Take 600 mg by mouth 2 (two) times daily as needed for moderate pain.   Yes [provider]  loperamide (IMODIUM) 2 MG capsule Take 2 mg by mouth 3 (three) times daily as needed for diarrhea or loose stools.   Yes [provider]  loratadine (CLARITIN) 10 MG tablet Take 10 mg by mouth daily as needed for allergies.   Yes [provider]  Melatonin 3 MG TABS Take 3 mg by mouth at bedtime as needed (insomnia).   Yes [provider]  metoprolol  tartrate (LOPRESSOR) 25 MG tablet Take 25 mg by mouth 2 (two) times daily.   Yes [provider]  mirtazapine (REMERON) 15 MG tablet Take 15 mg by mouth at bedtime.   Yes [provider]  ondansetron (ZOFRAN) 4 MG tablet Take 4 mg by mouth 2 (two) times daily as needed for nausea or vomiting.   Yes [provider]  oxyCODONE-acetaminophen (PERCOCET/ROXICET) 5-325 MG tablet Take 1 tablet by mouth 2 (two) times daily as needed for severe pain.   Yes [provider]  Probiotic Product (ALIGN) 4 MG CAPS Take 4 mg by mouth daily.   Yes [provider]  Probiotic Product (PROBIOTIC DAILY PO) Take 1 capsule by mouth daily.    Yes [provider]  rOPINIRole (REQUIP) 0.5 MG tablet Take 0.5 mg by mouth at bedtime.   Yes [provider]  traMADol (ULTRAM) 50 MG tablet Take 50 mg by mouth 3 (three) times daily as needed for moderate pain.   Yes [provider]  azithromycin (ZITHROMAX) 500 MG tablet Take 1 tablet (500 mg total) by mouth daily. Patient not taking: Reported on 04/28/2017 02/07/17   Cristy Folks, MD   Allergies  Allergen Reactions  . Fentanyl Nausea Only and Other (See Comments)    REACTION: nausea and depression  . Hydromorphone Hcl Nausea Only and Other (See Comments)    REACTION: depressed and nausea  . Sulfonamide Derivatives Hives    REACTION: hives   Review of Systems Non verbal  Physical Exam Pale weak gentleman Non verbal Essentially non responsive Does not arouse to his name being called Does not follow commands Shallow regular breath sounds Regular S1 S2 Trace edema Extremities are still warm to touch, no coolness no mottling noted Abdomen soft Tall gentleman  Vital Signs: BP 108/82   Pulse (!) 111   Resp (!) 22   SpO2 (!) 89%  Pain Scale: 0-10  Pain Score: 0-No pain   SpO2: SpO2: (!) 89 % O2 Device:SpO2: (!) 89 % O2 Flow Rate: .O2 Flow Rate (L/min): 10 L/min  IO: Intake/output  summary:   Intake/Output Summary (Last 24 hours) at 04/23/2017 1249 Last data filed at 05/06/2017 0455 Gross per 24 hour  Intake 500 ml  Output -  Net 500 ml   PPS 10% LBM:   Baseline Weight:   Most recent weight:       Palliative Assessment/Data:   Flowsheet Rows     Most Recent Value  Intake Tab  Referral Department  Hospitalist  Unit at Time of Referral  ER  Palliative Care Primary Diagnosis  Pulmonary  Date Notified  05/13/2017  Palliative Care Type  New Palliative care  Reason for referral  Clarify Goals of Care  Date of Admission  04/17/2017  # of days IP prior to Palliative referral  0  Clinical Assessment  Psychosocial & Spiritual Assessment  Palliative Care Outcomes      Time In:  11.30 Time Out:  12.30 pm  Time Total:  60 min  Greater than 50%  of this time was spent counseling and coordinating care related to the above assessment and plan.  Signed by: Loistine Chance, MD  [phone]  Please contact Palliative Medicine Team phone at [phone] for questions and concerns.  For individual provider: See Shea Evans

## 2017-04-21 NOTE — Progress Notes (Signed)
Pt remains on BIPAP at this time. 

## 2017-04-21 NOTE — ED Notes (Signed)
Pt resting comfortably. No distress or pain. Continues to refuse nasal cannula.

## 2017-04-21 NOTE — ED Notes (Signed)
Bed: WA20 Expected date:  Expected time:  Means of arrival:  Comments: hold

## 2017-04-21 NOTE — Clinical Social Work Note (Signed)
Clinical Social Work Assessment  Patient Details  Name: Ricardo Allen MRN: [id number] Date of Birth: Oct 18, 1925  Date of referral:  04/15/2017               Reason for consult:  Facility Placement, Discharge Planning                Permission sought to share information with:    Permission granted to share information::     Name::        Agency::     Relationship::     Contact Information:     Housing/Transportation Living arrangements for the past 2 months:  [place] of Information:  Adult Children(Alex Gold) Patient Interpreter Needed:  None Criminal Activity/Legal Involvement Pertinent to Current Situation/Hospitalization:    Significant Relationships:  Adult Children, Spouse Lives with:  Facility Resident Do you feel safe going back to the place where you live?    Need for family participation in patient care:     Care giving concerns:  Patient from [facility], admitted to hospital due pneumonia, and lungs sounding diminished.   Social Worker assessment / plan:  CSW intern spoke with patients step son, Burna Forts [phone] via telephone to discuss discharge planning. Alex informed this Probation officer that patient had been residing at [facility] for a few weeks before coming to the hospital. Cristie Hem also told this Probation officer that patient is regressing quickly and is being admitted.   Patient will be moved to inpatient and comfort care will be provided. CSW intern informed Cristie Hem that a consult for palliative care has been ordered and end of life questions could be directed to palliative care. CSW intern validated Alex's emotions at this time.  Employment status:  Retired Nurse, adult PT Recommendations:  Not assessed at this time Information / Referral to community resources:     Patient/Family's Response to care:  Patients family was understanding of care being provided by ED team.  Patient/Family's Understanding of and Emotional  Response to Diagnosis, Current Treatment, and Prognosis:  Patient and family is aware of current diagnosis and treatment plan. Patient and family are ware of patients functional abilities at this time.  Emotional Assessment Appearance:  Appears stated age Attitude/Demeanor/Rapport:    Affect (typically observed):  Unable to Assess Orientation:    Alcohol / Substance use:    Psych involvement (Current and /or in the community):  No (Comment)  Discharge Needs  Concerns to be addressed:  Discharge Planning Concerns Readmission within the last 30 days:  Yes Current discharge risk:  None Barriers to Discharge:  No Barriers Identified   Willeen Niece, Student-Social Work 05/02/2017, 9:47 AM

## 2017-04-21 NOTE — Progress Notes (Signed)
[facility] Progress Note  Subjective:   Vitals:   05/01/2017 0817 05/05/2017 0830 05/05/2017 0840 04/28/2017 0854  BP: (!) 142/91 133/85  133/85  Pulse: (!) 116 (!) 108  (!) 108  Resp: 20  (!) 22 20  SpO2: 100% 100%  100%    Inpatient medications: . lidocaine  20 mL Infiltration Once   . morphine     acetaminophen **OR** acetaminophen, antiseptic oral rinse, glycopyrrolate **OR** glycopyrrolate **OR** glycopyrrolate, haloperidol **OR** haloperidol **OR** haloperidol lactate, morphine, ondansetron **OR** ondansetron (ZOFRAN) IV, polyvinyl alcohol  Exam: Constitutional: lethargic, minimally responding Neck: normal, supple, no masses, no thyromegaly Respiratory: Diminished on L, tachypnea noted, rhonchi and rales. Cardiovascular: Regular rate and rhythm, no murmurs / rubs / gallops. No extremity edema. 2+ pedal pulses. No carotid bruits.  Abdomen: no tenderness, no masses palpated. Musculoskeletal: no clubbing / cyanosis. No joint deformity upper and lower extremities. Good ROM, no contractures. Normal muscle tone.  Skin: no rashes, lesions, ulcers. No induration Neurologic: CN 2-12 grossly intact. Sensation intact, DTR normal. Strength 5/5 in all 4.  Psychiatric: Somnolent, is able to wake up to voice, and answer a couple of yes / no questions, then falls back asleep   Brief Summary: HPI: Ricardo Allen is a 84 y.o. male with medical history significant of HTN, depression, chronic back pain.  Patient presented to the ED with respiratory distress.  Per EDP notes: Patient had been sick for the last 2 weeks or so. Had chest congestion and cough. He had an outpatient chest x-ray today that showed concern for bilateral pneumonia. His breathing worsened and he was brought to the ER by ambulance from nursing home. Patient administered albuterol 10 mg, Atrovent 0.5 mg, Solu-Medrol 125 mg during transport.  ED Course: patient given zosyn and vanc.  CT scan showed PNA and large L pleural  effusion. Unfortunately patients mental status deteriorated despite being put on BIPAP.  He was able to wake up some and answer a couple of yes / no questions, but was somnolent.      Impression/Plan:  Principal Problem:   Admission for end of life care Active Problems:   HCAP (healthcare-associated pneumonia)   Acute respiratory failure with hypoxia and hypercapnia (HCC)   Parapneumonic effusion    1. Admission for end of life care - 1. Patient with likely terminal respiratory failure and PNA 2. ABGs showed severe CO2 retention 3. Admitting MD spoke with son Burna Forts whom our records indicated is the patients HPOA: 1. [name] confirmed that he was the HPOA 2. We offered attempt at urgent thoracentesis for large L pleural effusion. 3. However, given how chronically ill the patient was, his DNR/DNI status, his acute ABG findings despite BIPAP, we did inform him that we were concerned that patient wouldn't survive the hospital stay for this pneumonia. 4. [name], after Dr. Waverly Ferrari discussed options including thoracentesis with him, requested comfort measures only for the patient who has been in poor health as of late even before this acute illness with poor quality of life by Son's own description. 4. Mr Girtha Rm said he would contact patients wife, the son understand that there was a high likely hood that patient wont survive long.  5. Patient was admitted for comfort measures. 6. Morphine gtt ordered, titrate to comfort, bolus if needed.  7. Will hold off on further needle sticks 8. Abx were not reordered 9. Pal care consult pending  DVT prophylaxis: None Code Status: DNR/DNI - comfort measures only Family  Communication:  Admit MD spoke with son Burna Forts over phone, discussion as above. Disposition Plan: TBD Consults called: Pal care consult put into Epic Admission status: Admitted to inpatient for end of life care and morphine gtt       Kelly Splinter MD [facility] pgr [phone] 04/16/2017, 9:06 AM   Recent Labs  Lab 04/24/2017 0053  NA 144  K 5.4*  CL 112*  CO2 22  GLUCOSE 115*  BUN 43*  CREATININE 2.57*  CALCIUM 9.5   Recent Labs  Lab 05/09/2017 0053  AST 49*  ALT 28  ALKPHOS 121  BILITOT 0.6  PROT 8.3*  ALBUMIN 3.1*   Recent Labs  Lab 05/05/2017 0053  WBC 23.3*  NEUTROABS 16.7*  HGB 12.1*  HCT 39.2  MCV 95.1  PLT 427*   Iron/TIBC/Ferritin/ %Sat    Component Value Date/Time   IRON 15 (L) 03/30/2013 1901   IRON 49 01/22/2013 1426   TIBC 200 (L) 03/30/2013 1901   TIBC 255 01/22/2013 1426   FERRITIN 693 (H) 08/25/2013 1127   IRONPCTSAT 8 (L) 03/30/2013 1901   IRONPCTSAT 19 (L) 01/22/2013 1426

## 2017-04-21 NOTE — ED Notes (Signed)
Notified EDP,Pollina,MD., Pt. I-stat troponin results 0.13 and RN,Cherrelle made aware.

## 2017-04-22 DIAGNOSIS — R0600 Dyspnea, unspecified: Secondary | ICD-10-CM

## 2017-04-22 LAB — MRSA PCR SCREENING: MRSA BY PCR: POSITIVE — AB

## 2017-04-26 LAB — CULTURE, BLOOD (ROUTINE X 2)
CULTURE: NO GROWTH
CULTURE: NO GROWTH
SPECIAL REQUESTS: ADEQUATE

## 2017-04-29 ENCOUNTER — Encounter (INDEPENDENT_AMBULATORY_CARE_PROVIDER_SITE_OTHER): Payer: Medicare Other | Admitting: Ophthalmology

## 2017-05-14 NOTE — Discharge Summary (Signed)
Discharge/ Death Summary  Patient ID: Ricardo Allen MRN: [id number] DOB/AGE: February 04, 1925 84 y.o.  Admit date: 04/16/2017 Discharge date: 05-03-17  Admission Diagnoses: Principal Problem:   Admission for end of life care Active Problems:   Chronic kidney disease   HCAP (healthcare-associated pneumonia)   Acute respiratory failure with hypoxia and hypercapnia (HCC)   Parapneumonic effusion   AKI (acute kidney injury) ([place])   PNA (pneumonia)   Discharge Diagnoses:  Principal Problem:   Admission for end of life care Active Problems:   Chronic kidney disease   HCAP (healthcare-associated pneumonia)   Acute respiratory failure with hypoxia and hypercapnia (HCC)   Parapneumonic effusion   AKI (acute kidney injury) ([place])   PNA (pneumonia)   Discharged Condition: expired  Hospital Course: Ricardo Allen is a 84 y.o. male with medical history significant of HTN, depression, chronic back pain.  Patient presents to the ED with respiratory distress.  Per EDP notes: Patient reports that he has been sick for the last 2 weeks or so. He has had chest congestion and cough. He had an outpatient chest x-ray today that showed concern for bilateral pneumonia. His breathing worsened tonight and he was brought to the ER by ambulance from nursing home. Patient administered albuterol 10 mg, Atrovent 0.5 mg, Solu-Medrol 125 mg during transport.  ED Course: patient given zosyn and vanc.  CT scan shows PNA and large L pleural effusion.  Unfortunately patients mental status has deteriorated despite being put on BIPAP.  He is now able to wake up some and answer a couple of yes / no questions, but is quite somnolent.  Assessment/ Plan:  Patient w/ pneumonia and severe CO2 retention, DNR per his wishes and confirmed by the family.  Had a brief period of bipap in the ED and got IV abx initially, then admitting MD spoke with family and decision was made for comfort care, no further aggressive medical care.  Abx  and bipap were discontinued and MSO4 gtt was started mostly for air hunger. The patient passed away the following day on April 9th at 12:10 pm.       Kelly Splinter MD [facility] pgr [phone] 04/26/2017, 9:57 AM

## 2017-05-14 NOTE — Progress Notes (Signed)
Pt found without respirations and pulseless. Verified by 2 RNs. MD, House Admin Coor, and Cristie Hem (son) HCPOA notified. Alex to retrieve patient ring and watch. 17mls of Morphine wasted in sharps.

## 2017-05-14 NOTE — Progress Notes (Signed)
[facility] Progress Note  Subjective: drowsy, arouses briefly , not engaging in conversation though  Vitals:   05/12/2017 1539 04/14/2017 1625 04/23/2017 2214 02-May-2017 0544  BP: 126/65 (!) 122/91 (!) 110/59 97/77  Pulse: (!) 110 (!) 115 (!) 101 80  Resp: 20 18 (!) 42 (!) 33  Temp:  98 F (36.7 C) 98.1 F (36.7 C) 97.9 F (36.6 C)  TempSrc:  Oral Oral Axillary  SpO2: (!) 88% 99% (!) 85% (!) 80%  Weight:  75.1 kg (165 lb 9.1 oz)    Height:  6' (1.829 m)      Inpatient medications: . lidocaine  20 mL Infiltration Once   . morphine 2 mg/hr ( 2256)   acetaminophen **OR** acetaminophen, antiseptic oral rinse, glycopyrrolate **OR** glycopyrrolate **OR** glycopyrrolate, haloperidol **OR** haloperidol **OR** haloperidol lactate, morphine, ondansetron **OR** ondansetron (ZOFRAN) IV, polyvinyl alcohol  Exam: Constitutional: lethargic, minimally responding, some brief apnea spells Neck: normal, supple, no masses, no thyromegaly Respiratory: Diminished on L, tachypnea noted, rhonchi and rales. Cardiovascular: Regular rate and rhythm, no murmurs / rubs / gallops. No extremity edema. 2+ pedal pulses. No carotid bruits.  Abdomen: no tenderness, no masses palpated. Musculoskeletal: no clubbing / cyanosis. No joint deformity upper and lower extremities. Good ROM, no contractures. Normal muscle tone.  Skin: no rashes, lesions, ulcers. No induration Neurologic: CN 2-12 grossly intact. Sensation intact, DTR normal. Strength 5/5 in all 4.  Psychiatric: Somnolent, is able to wake up to voice, and answer a couple of yes / no questions, then falls back asleep   Brief Summary: HPI: Ricardo Allen is a 84 y.o. male with medical history significant of HTN, depression, chronic back pain.  Patient presented to the ED with respiratory distress.  Per EDP notes: Patient had been sick for the last 2 weeks or so. Had chest congestion and cough. He had an outpatient chest x-ray today that showed  concern for bilateral pneumonia. His breathing worsened and he was brought to the ER by ambulance from nursing home. Patient administered albuterol 10 mg, Atrovent 0.5 mg, Solu-Medrol 125 mg during transport.  ED Course: patient given zosyn and vanc.  CT scan showed PNA and large L pleural effusion. Unfortunately patients mental status deteriorated despite being put on BIPAP.  He was able to wake up some and answer a couple of yes / no questions, but was somnolent.      Principal Problem:   Admission for end of life care Active Problems:   HCAP (healthcare-associated pneumonia)   Acute respiratory failure with hypoxia and hypercapnia (HCC)   Parapneumonic effusion   Impression/ Plan:   1) Admission for end of life care - patient with PNA and probable terminal respiratory failure ,sent from [facility] - pt is DNR - comfort care, on MSO4 drip - palliative care following , see their notes as well  2) Afib  3) HTN  4) Depression  5) Chronic back pain  6) Recnt RSV + strep pneumo PNA , Jan 2019  7) Bedbound - at SNF  DVT prophylaxis: None Code Status: DNR/DNI - comfort measures only Disposition Plan: prob hospital death Consults called: Palliative care Admission status: Admitted to inpatient for end of life care and morphine gtt       Kelly Splinter MD [facility] pgr [phone] 05-02-17, 10:20 AM   Recent Labs  Lab 04/19/2017 0053  NA 144  K 5.4*  CL 112*  CO2 22  GLUCOSE 115*  BUN 43*  CREATININE 2.57*  CALCIUM 9.5   Recent Labs  Lab 05/10/2017 0053  AST 49*  ALT 28  ALKPHOS 121  BILITOT 0.6  PROT 8.3*  ALBUMIN 3.1*   Recent Labs  Lab 04/18/2017 0053  WBC 23.3*  NEUTROABS 16.7*  HGB 12.1*  HCT 39.2  MCV 95.1  PLT 427*   Iron/TIBC/Ferritin/ %Sat    Component Value Date/Time   IRON 15 (L) 03/30/2013 1901   IRON 49 01/22/2013 1426   TIBC 200 (L) 03/30/2013 1901   TIBC 255 01/22/2013 1426   FERRITIN 693 (H) 08/25/2013 1127    IRONPCTSAT 8 (L) 03/30/2013 1901   IRONPCTSAT 19 (L) 01/22/2013 1426

## 2017-05-14 NOTE — Progress Notes (Signed)
Daily Progress Note   Patient Name: Ricardo Allen       Date: 05-09-2017 DOB: [birth year]  Age: 84 y.o. MRN#: [id number] Attending Physician: Roney Jaffe, MD Primary Care Physician: Velna Hatchet, MD Admit Date: 05/09/2017  Reason for Consultation/Follow-up: Terminal Care  Subjective: I saw and examined Mr. Bunton today.  He is actively dying and did not respond to gentle verbal or tactile stimulation.  I discussed at length with his son, Burna Forts.  Alex reports understanding his situation and that Mr. Fotheringham is approaching end of life.  He has been struggling with the fact that his mother is having difficulty accepting this.    He reports no concerns regarding Mr. Terrance comfort and expressed appreciation for care that he has received.  Length of Stay: 1  Current Medications: Scheduled Meds:  . lidocaine  20 mL Infiltration Once    Continuous Infusions: . morphine 2 mg/hr (04/19/2017 2256)    PRN Meds: acetaminophen **OR** acetaminophen, antiseptic oral rinse, glycopyrrolate **OR** glycopyrrolate **OR** glycopyrrolate, haloperidol **OR** haloperidol **OR** haloperidol lactate, morphine, ondansetron **OR** ondansetron (ZOFRAN) IV, polyvinyl alcohol  Physical Exam  Does not arouse to verbal or tactile stimulation Shallow irregular breath sounds Regular, faint radial pulse, nonpalpable DP or PT Extremities with coolness, no mottling noted Abdomen soft          Vital Signs: BP (!) 91/59 (BP Location: Left Arm)   Pulse (!) 130   Temp 98.4 F (36.9 C) (Oral)   Resp 17   Ht 6' (1.829 m)   Wt 75.1 kg (165 lb 9.1 oz)   SpO2 (!) 79%   BMI 22.45 kg/m  SpO2: SpO2: (!) 79 % O2 Device: O2 Device: Room Air O2 Flow Rate: O2 Flow Rate (L/min): 10 L/min  Intake/output summary:    Intake/Output Summary (Last 24 hours) at 2017/05/09 1444 Last data filed at 05-09-2017 0545 Gross per 24 hour  Intake 30 ml  Output 200 ml  Net -170 ml   LBM:   Baseline Weight: Weight: 75.1 kg (165 lb 9.1 oz) Most recent weight: Weight: 75.1 kg (165 lb 9.1 oz)       Palliative Assessment/Data:    Flowsheet Rows     Most Recent Value  Intake Tab  Referral Department  Hospitalist  Unit at Time of Referral  ER  Palliative Care Primary Diagnosis  Pulmonary  Date Notified  05/08/2017  Palliative Care Type  New Palliative care  Reason for referral  Clarify Goals of Care  Date of Admission  05/06/2017  # of days IP prior to Palliative referral  0  Clinical Assessment  Psychosocial & Spiritual Assessment  Palliative Care Outcomes      Patient Active Problem List   Diagnosis Date Noted  . Admission for end of life care 04/15/2017  . Acute respiratory failure with hypoxia and hypercapnia ([place]) 04/15/2017  . Parapneumonic effusion 05/02/2017  . AKI (acute kidney injury) ([place]) 04/24/2017  . PNA (pneumonia) 04/15/2017  . Pneumonia due to Streptococcus pneumoniae ([place]) 02/06/2017  . HCAP (healthcare-associated pneumonia) 02/06/2017  . Chronic kidney disease 08/25/2013  . Intracranial hemorrhage ([place]) 04/04/2013  . Subdural hygroma 04/04/2013  . Restless leg syndrome 08/03/2012  . Diverticulosis of colon (without mention of hemorrhage) 06/21/2012  . Anemia 06/18/2012    Class: Acute  . GI bleed 06/18/2012    Class: Chronic  . Atrial fibrillation ([place]) 11/27/2009  . HYPOTENSION, ORTHOSTATIC 10/31/2009  . Other specified iron deficiency anemias 09/05/2009  . TRAUMATIC ARTHROPATHY PELVIC REGION AND THIGH 06/29/2009  . GOUT 03/22/2009  . NAUSEA 03/08/2009  . LUNG NODULE 03/29/2008  . WEIGHT LOSS, ABNORMAL 03/22/2008  . LOW BACK PAIN 02/03/2007  . LUMBAR RADICULOPATHY, LEFT 10/09/2006  . DM W/MANIFESTATION NEC, TYPE II, UNCONTROLLED 08/27/2006  . HYPERLIPIDEMIA 08/27/2006  .  DEMENTIA 08/27/2006  . DEPRESSION 08/27/2006  . MELANOMA, MALIGNANT, TRUNK 08/19/2006  . Complex sleep apnea syndrome 08/19/2006    Palliative Care Assessment & Plan   Patient Profile: 84 yo gentleman, was at ALF, recently moved to SNF 1 month ago, patient has had declining oral intake for past month or so, unintentionally lost 10 lbs, has been mostly bed bound for past month, not alert, not interactive for most of last month. Patient admitted with terminal respiratory failure and PNA, ABGshowed severe CO2 retention.   Recommendations/Plan:  Comfort care: Appears comfortable on current regimen.  Continue same; titrate continuous morphine as needed to ensure comfort  Discussed end of life signs and symptoms with son, offered supportive care and active listening  Prognosis appears to likely be hours to very limited number of days at this point.  At this point, I don't believe he appears stable enough to consider transfer to residential hospice.  Continue to assess daily.  Goals of Care and Additional Recommendations:  Limitations on Scope of Treatment: Full Comfort Care  Code Status:    Code Status Orders  (From admission, onward)        Start     Ordered   04/26/2017 0503  Do not attempt resuscitation (DNR)  Continuous    Question Answer Comment  In the event of cardiac or respiratory ARREST Do not call a "code blue"   In the event of cardiac or respiratory ARREST Do not perform Intubation, CPR, defibrillation or ACLS   In the event of cardiac or respiratory ARREST Use medication by any route, position, wound care, and other measures to relive pain and suffering. May use oxygen, suction and manual treatment of airway obstruction as needed for comfort.      04/23/2017 0503    Code Status History    Date Active Date Inactive Code Status Order ID Comments User Context   04/27/2017 0416 04/26/2017 0503 DNR [id number]  Etta Quill, DO ED   02/05/2017 1838 02/06/2017 1909 Full Code  [id number]  Patrecia Pour, Christean Grief, MD ED   03/30/2013 1712 04/06/2013 1953 Full Code [id number]  Velna Hatchet, MD Inpatient   06/19/2012 0755 06/23/2012 1414 Full Code [id number]  Velna Hatchet, MD Inpatient    Advance Directive Documentation     Most Recent Value  Type of Advance Directive  Out of facility DNR (pink MOST or yellow form)  Pre-existing out of facility DNR order (yellow form or pink MOST form)  Yellow form placed in chart (order not valid for inpatient use), Pink MOST form placed in chart (order not valid for inpatient use)  "MOST" Form in Place?  -       Prognosis:   Hours - Days  Discharge Planning:  Anticipated Hospital Death  Care plan was discussed with son, RN  Thank you for allowing the Palliative Medicine Team to assist in the care of this patient.   Time In: 1420 Time Out: 1500 Total Time 40 Prolonged Time Billed No      Greater than 50%  of this time was spent counseling and coordinating care related to the above assessment and plan.  Micheline Rough, MD  Please contact Palliative Medicine Team phone at [phone] for questions and concerns.

## 2017-05-14 DEATH — deceased
